# Patient Record
Sex: Female | Born: 1975
Health system: Southern US, Community
[De-identification: ages and names within clinical notes are randomized; demographics above are authoritative.]

## PROBLEM LIST (undated history)

## (undated) DIAGNOSIS — F419 Anxiety disorder, unspecified: Secondary | ICD-10-CM

## (undated) DIAGNOSIS — Z8619 Personal history of other infectious and parasitic diseases: Secondary | ICD-10-CM

## (undated) DIAGNOSIS — E282 Polycystic ovarian syndrome: Secondary | ICD-10-CM

## (undated) DIAGNOSIS — E1110 Type 2 diabetes mellitus with ketoacidosis without coma: Secondary | ICD-10-CM

## (undated) DIAGNOSIS — F32A Depression, unspecified: Secondary | ICD-10-CM

## (undated) DIAGNOSIS — G4730 Sleep apnea, unspecified: Secondary | ICD-10-CM

## (undated) DIAGNOSIS — Z87442 Personal history of urinary calculi: Secondary | ICD-10-CM

## (undated) DIAGNOSIS — I10 Essential (primary) hypertension: Secondary | ICD-10-CM

## (undated) DIAGNOSIS — R519 Headache, unspecified: Secondary | ICD-10-CM

## (undated) DIAGNOSIS — J45909 Unspecified asthma, uncomplicated: Secondary | ICD-10-CM

## (undated) DIAGNOSIS — K219 Gastro-esophageal reflux disease without esophagitis: Secondary | ICD-10-CM

## (undated) DIAGNOSIS — F329 Major depressive disorder, single episode, unspecified: Secondary | ICD-10-CM

## (undated) DIAGNOSIS — E559 Vitamin D deficiency, unspecified: Secondary | ICD-10-CM

## (undated) DIAGNOSIS — M1990 Unspecified osteoarthritis, unspecified site: Secondary | ICD-10-CM

## (undated) HISTORY — DX: Unspecified osteoarthritis, unspecified site: M19.90

## (undated) HISTORY — DX: Vitamin D deficiency, unspecified: E55.9

## (undated) HISTORY — PX: FRACTURE SURGERY: SHX138

## (undated) HISTORY — DX: Depression, unspecified: F32.A

## (undated) HISTORY — DX: Type 2 diabetes mellitus with ketoacidosis without coma: E11.10

## (undated) HISTORY — DX: Gastro-esophageal reflux disease without esophagitis: K21.9

## (undated) HISTORY — DX: Major depressive disorder, single episode, unspecified: F32.9

## (undated) HISTORY — DX: Polycystic ovarian syndrome: E28.2

## (undated) HISTORY — DX: Anxiety disorder, unspecified: F41.9

## (undated) HISTORY — DX: Personal history of other infectious and parasitic diseases: Z86.19

---
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
## 1998-10-11 DIAGNOSIS — E282 Polycystic ovarian syndrome: Secondary | ICD-10-CM | POA: Insufficient documentation

## 1998-10-11 HISTORY — DX: Polycystic ovarian syndrome: E28.2

## 2004-10-11 DIAGNOSIS — J45909 Unspecified asthma, uncomplicated: Secondary | ICD-10-CM

## 2004-10-11 HISTORY — DX: Unspecified asthma, uncomplicated: J45.909

## 2007-11-17 ENCOUNTER — Ambulatory Visit: Payer: Self-pay | Admitting: Family Medicine

## 2009-01-13 DIAGNOSIS — G4710 Hypersomnia, unspecified: Secondary | ICD-10-CM | POA: Insufficient documentation

## 2009-01-13 DIAGNOSIS — Z8632 Personal history of gestational diabetes: Secondary | ICD-10-CM

## 2009-01-13 HISTORY — DX: Personal history of gestational diabetes: Z86.32

## 2009-01-14 DIAGNOSIS — E559 Vitamin D deficiency, unspecified: Secondary | ICD-10-CM | POA: Insufficient documentation

## 2009-05-14 ENCOUNTER — Ambulatory Visit: Payer: Self-pay | Admitting: Gastroenterology

## 2009-06-26 ENCOUNTER — Ambulatory Visit: Payer: Self-pay

## 2009-08-20 DIAGNOSIS — K219 Gastro-esophageal reflux disease without esophagitis: Secondary | ICD-10-CM | POA: Insufficient documentation

## 2009-08-29 ENCOUNTER — Ambulatory Visit: Payer: Self-pay

## 2009-09-10 ENCOUNTER — Ambulatory Visit: Payer: Self-pay

## 2010-03-06 ENCOUNTER — Ambulatory Visit: Payer: Self-pay

## 2010-03-11 ENCOUNTER — Ambulatory Visit: Payer: Self-pay

## 2010-05-01 ENCOUNTER — Ambulatory Visit: Payer: Self-pay

## 2010-05-11 ENCOUNTER — Ambulatory Visit: Payer: Self-pay

## 2010-06-22 ENCOUNTER — Encounter: Payer: Self-pay | Admitting: Obstetrics and Gynecology

## 2011-03-31 ENCOUNTER — Emergency Department: Payer: Self-pay | Admitting: Unknown Physician Specialty

## 2013-09-27 ENCOUNTER — Ambulatory Visit: Payer: Self-pay

## 2014-11-13 ENCOUNTER — Ambulatory Visit: Payer: Self-pay | Admitting: Family Medicine

## 2014-11-13 LAB — BASIC METABOLIC PANEL
BUN: 11 mg/dL (ref 4–21)
Creatinine: 0.6 mg/dL (ref 0.5–1.1)
GLUCOSE: 126 mg/dL
Potassium: 4.5 mmol/L (ref 3.4–5.3)
Sodium: 136 mmol/L — AB (ref 137–147)

## 2014-11-13 LAB — LIPID PANEL
Cholesterol: 176 mg/dL (ref 0–200)
HDL: 34 mg/dL — AB (ref 35–70)
LDL Cholesterol: 85 mg/dL
Triglycerides: 285 mg/dL — AB (ref 40–160)

## 2014-11-13 LAB — TSH: TSH: 0.86 u[IU]/mL (ref 0.41–5.90)

## 2015-02-26 ENCOUNTER — Other Ambulatory Visit: Payer: Self-pay | Admitting: Family Medicine

## 2015-02-26 ENCOUNTER — Ambulatory Visit
Admission: RE | Admit: 2015-02-26 | Discharge: 2015-02-26 | Disposition: A | Discharge status: Home / Self Care | Payer: BLUE CROSS/BLUE SHIELD | Source: Ambulatory Visit | Attending: Family Medicine | Admitting: Family Medicine

## 2015-02-26 DIAGNOSIS — M545 Low back pain: Secondary | ICD-10-CM | POA: Diagnosis present

## 2015-02-26 DIAGNOSIS — M47816 Spondylosis without myelopathy or radiculopathy, lumbar region: Secondary | ICD-10-CM | POA: Diagnosis not present

## 2015-05-12 ENCOUNTER — Encounter: Payer: Self-pay | Admitting: *Deleted

## 2015-05-14 ENCOUNTER — Encounter: Payer: Self-pay | Admitting: Obstetrics and Gynecology

## 2015-05-30 ENCOUNTER — Encounter: Payer: Self-pay | Admitting: Obstetrics and Gynecology

## 2015-06-06 ENCOUNTER — Ambulatory Visit (INDEPENDENT_AMBULATORY_CARE_PROVIDER_SITE_OTHER): Payer: BLUE CROSS/BLUE SHIELD | Admitting: Obstetrics and Gynecology

## 2015-06-06 ENCOUNTER — Encounter: Payer: Self-pay | Admitting: Obstetrics and Gynecology

## 2015-06-06 ENCOUNTER — Other Ambulatory Visit: Payer: Self-pay | Admitting: Obstetrics and Gynecology

## 2015-06-06 VITALS — BP 140/82 | HR 72 | Ht 61.0 in | Wt 270.2 lb

## 2015-06-06 DIAGNOSIS — Z01419 Encounter for gynecological examination (general) (routine) without abnormal findings: Secondary | ICD-10-CM

## 2015-06-06 MED ORDER — ESCITALOPRAM OXALATE 20 MG PO TABS: 20.0000 mg | ORAL_TABLET | Freq: Every day | ORAL | Status: DC

## 2015-06-06 NOTE — Progress Notes (Signed)
  Subjective:     Julia Lynch is a 39 y.o. female and is here for a comprehensive physical exam. The patient reports feeling worse anxiety for the last few months, desires increasing Lexapro. Also has been working out with Systems analyst x 6 months and lost 16#.  Social History   Social History  . Marital Status: Married    Spouse Name: N/A  . Number of Children: N/A  . Years of Education: N/A   Occupational History  . Not on file.   Social History Main Topics  . Smoking status: Former Games developer  . Smokeless tobacco: Never Used  . Alcohol Use: Yes  . Drug Use: No  . Sexual Activity: Yes     Comment: husband-vasectomy   Other Topics Concern  . Not on file   Social History Narrative   Health Maintenance  Topic Date Due  . HIV Screening  08/26/1991  . TETANUS/TDAP  08/26/1995  . INFLUENZA VACCINE  05/12/2015    The following portions of the patient's history were reviewed and updated as appropriate: allergies, current medications, past family history, past medical history, past social history, past surgical history and problem list.  Review of Systems A comprehensive review of systems was negative except for: Behavioral/Psych: positive for anxiety   Objective:    General appearance: alert, cooperative, appears stated age and morbidly obese Neck: no adenopathy, no carotid bruit, no JVD, supple, symmetrical, trachea midline and thyroid not enlarged, symmetric, no tenderness/mass/nodules Lungs: clear to auscultation bilaterally Breasts: normal appearance, no masses or tenderness Heart: regular rate and rhythm, S1, S2 normal, no murmur, click, rub or gallop Abdomen: soft, non-tender; bowel sounds normal; no masses,  no organomegaly Pelvic: cervix normal in appearance, external genitalia normal, no adnexal masses or tenderness, no cervical motion tenderness, rectovaginal septum normal, uterus normal size, shape, and consistency and vagina normal without discharge     Assessment:    Healthy female exam. Morbid Obesity; Anxiety on SSRI      Plan:  Pap and routine screening labs obtained; Increased Lexapro to  daily- to let me know within 3 weeks if symptoms have not improved.  RTC 1 year or as needed   See After Visit Summary for Counseling Recommendations

## 2015-06-06 NOTE — Patient Instructions (Signed)
  Place annual gynecologic exam patient instructions here.  Thank you for enrolling in MyChart. Please follow the instructions below to securely access your online medical record. MyChart allows you to send messages to your doctor, view your test results, manage appointments, and more.   How Do I Sign Up? 1. In your Internet browser, go to [employer] and enter [email or web address]. 2. Click on the Sign Up Now link in the Sign In box. You will see the New Member Sign Up page. 3. Enter your MyChart Access Code exactly as it appears below. You will not need to use this code after you've completed the sign-up process. If you do not sign up before the expiration date, you must request a new code.  MyChart Access Code: [email or web address] Expires: 08/05/2015  4:02 PM  4. Enter your Social Security Number ([id number]) and Date of Birth (mm/dd/yyyy) as indicated and click Submit. You will be taken to the next sign-up page. 5. Create a MyChart ID. This will be your MyChart login ID and cannot be changed, so think of one that is secure and easy to remember. 6. Create a MyChart password. You can change your password at any time. 7. Enter your Password Reset Question and Answer. This can be used at a later time if you forget your password.  8. Enter your e-mail address. You will receive e-mail notification when new information is available in MyChart. 9. Click Sign Up. You can now view your medical record.   Additional Information Remember, MyChart is NOT to be used for urgent needs. For medical emergencies, dial 911.

## 2015-06-07 LAB — COMPREHENSIVE METABOLIC PANEL
A/G RATIO: 1.5 (ref 1.1–2.5)
ALK PHOS: 80 IU/L (ref 39–117)
ALT: 28 IU/L (ref 0–32)
AST: 23 IU/L (ref 0–40)
Albumin: 4.1 g/dL (ref 3.5–5.5)
BUN/Creatinine Ratio: 27 — ABNORMAL HIGH (ref 8–20)
BUN: 19 mg/dL (ref 6–20)
Bilirubin Total: 0.3 mg/dL (ref 0.0–1.2)
CHLORIDE: 98 mmol/L (ref 97–108)
CO2: 25 mmol/L (ref 18–29)
Calcium: 9.5 mg/dL (ref 8.7–10.2)
Creatinine, Ser: 0.71 mg/dL (ref 0.57–1.00)
GFR calc Af Amer: 125 mL/min/{1.73_m2} (ref >59)
GFR, EST NON AFRICAN AMERICAN: 108 mL/min/{1.73_m2} (ref >59)
GLOBULIN, TOTAL: 2.8 g/dL (ref 1.5–4.5)
Glucose: 83 mg/dL (ref 65–99)
POTASSIUM: 4.3 mmol/L (ref 3.5–5.2)
SODIUM: 139 mmol/L (ref 134–144)
Total Protein: 6.9 g/dL (ref 6.0–8.5)

## 2015-06-07 LAB — THYROID PANEL WITH TSH
FREE THYROXINE INDEX: 2 (ref 1.2–4.9)
T3 UPTAKE RATIO: 25 % (ref 24–39)
T4 TOTAL: 8.1 ug/dL (ref 4.5–12.0)
TSH: 0.648 u[IU]/mL (ref 0.450–4.500)

## 2015-06-07 LAB — HEMOGLOBIN A1C
Est. average glucose Bld gHb Est-mCnc: 123 mg/dL
Hgb A1c MFr Bld: 5.9 % — ABNORMAL HIGH (ref 4.8–5.6)

## 2015-06-07 LAB — LIPID PANEL
CHOL/HDL RATIO: 4.8 ratio — AB (ref 0.0–4.4)
Cholesterol, Total: 178 mg/dL (ref 100–199)
HDL: 37 mg/dL — AB (ref >39)
LDL Calculated: 81 mg/dL (ref 0–99)
TRIGLYCERIDES: 298 mg/dL — AB (ref 0–149)
VLDL Cholesterol Cal: 60 mg/dL — ABNORMAL HIGH (ref 5–40)

## 2015-06-10 ENCOUNTER — Other Ambulatory Visit: Payer: Self-pay | Admitting: Obstetrics and Gynecology

## 2015-06-10 ENCOUNTER — Telehealth: Payer: Self-pay | Admitting: *Deleted

## 2015-06-10 NOTE — Telephone Encounter (Signed)
-----   Message from Ulyses Amor, [place] sent at 06/10/2015  4:38 PM EDT ----- Please let her know her labs are better, HgA1C still elevated, but better, and total chol now in normal, needs to keep workingon wt loss and low carb diet, will recheck levels next year, all other labs normal

## 2015-06-10 NOTE — Telephone Encounter (Signed)
Notified pt of results she was very happy 

## 2015-06-11 LAB — CYTOLOGY - PAP

## 2015-06-12 ENCOUNTER — Encounter: Payer: Self-pay | Admitting: Obstetrics and Gynecology

## 2015-06-12 ENCOUNTER — Encounter: Payer: Self-pay | Admitting: *Deleted

## 2015-07-15 ENCOUNTER — Other Ambulatory Visit: Payer: Self-pay | Admitting: Family Medicine

## 2015-08-07 ENCOUNTER — Encounter: Payer: Self-pay | Admitting: Obstetrics and Gynecology

## 2015-08-07 ENCOUNTER — Ambulatory Visit (INDEPENDENT_AMBULATORY_CARE_PROVIDER_SITE_OTHER): Payer: BLUE CROSS/BLUE SHIELD | Admitting: Obstetrics and Gynecology

## 2015-08-07 VITALS — BP 128/86 | HR 88 | Ht 61.0 in | Wt 271.7 lb

## 2015-08-07 DIAGNOSIS — T192XXA Foreign body in vulva and vagina, initial encounter: Secondary | ICD-10-CM

## 2015-08-07 NOTE — Progress Notes (Signed)
Subjective:     Patient ID: Julia Lynch, female   DOB: [birth year], 39 y.o.   MRN: [id number]  HPI Unsure if she removed last tampon 3 days ago  Review of Systems Cramping and vulvar pains, urinary frequency    Objective:   Physical Exam A&O x4 Well groomed obese female UA negative Pelvic exam: normal external genitalia, vulva, vagina, cervix, uterus and adnexa, no tampon found.    Assessment:     Urinary frequency Tampon check- none found     Plan:     Reassured RTC as needed.  Deyon Chizek Ines BloomerBurr, CNM

## 2015-09-12 ENCOUNTER — Other Ambulatory Visit: Payer: Self-pay | Admitting: Surgery

## 2015-09-12 DIAGNOSIS — S43431A Superior glenoid labrum lesion of right shoulder, initial encounter: Secondary | ICD-10-CM

## 2015-09-26 ENCOUNTER — Encounter: Payer: Self-pay | Admitting: Family Medicine

## 2015-09-26 ENCOUNTER — Ambulatory Visit (INDEPENDENT_AMBULATORY_CARE_PROVIDER_SITE_OTHER): Payer: BLUE CROSS/BLUE SHIELD | Admitting: Family Medicine

## 2015-09-26 VITALS — BP 142/88 | HR 90 | Temp 97.8°F | Resp 16 | Ht 61.0 in | Wt 275.0 lb

## 2015-09-26 DIAGNOSIS — J45909 Unspecified asthma, uncomplicated: Secondary | ICD-10-CM | POA: Insufficient documentation

## 2015-09-26 DIAGNOSIS — R519 Headache, unspecified: Secondary | ICD-10-CM | POA: Insufficient documentation

## 2015-09-26 DIAGNOSIS — F419 Anxiety disorder, unspecified: Secondary | ICD-10-CM | POA: Insufficient documentation

## 2015-09-26 DIAGNOSIS — R12 Heartburn: Secondary | ICD-10-CM | POA: Insufficient documentation

## 2015-09-26 DIAGNOSIS — R413 Other amnesia: Secondary | ICD-10-CM | POA: Diagnosis not present

## 2015-09-26 DIAGNOSIS — R51 Headache: Secondary | ICD-10-CM | POA: Diagnosis not present

## 2015-09-26 DIAGNOSIS — Z23 Encounter for immunization: Secondary | ICD-10-CM | POA: Diagnosis not present

## 2015-09-26 DIAGNOSIS — F4321 Adjustment disorder with depressed mood: Secondary | ICD-10-CM | POA: Insufficient documentation

## 2015-09-26 DIAGNOSIS — M549 Dorsalgia, unspecified: Secondary | ICD-10-CM | POA: Insufficient documentation

## 2015-09-26 DIAGNOSIS — M1990 Unspecified osteoarthritis, unspecified site: Secondary | ICD-10-CM | POA: Insufficient documentation

## 2015-09-26 DIAGNOSIS — G43909 Migraine, unspecified, not intractable, without status migrainosus: Secondary | ICD-10-CM | POA: Insufficient documentation

## 2015-09-26 DIAGNOSIS — N943 Premenstrual tension syndrome: Secondary | ICD-10-CM | POA: Insufficient documentation

## 2015-09-26 MED ORDER — DIAZEPAM 5 MG PO TABS: ORAL_TABLET | ORAL | Status: DC

## 2015-09-26 MED ORDER — AMPHETAMINE-DEXTROAMPHET ER 10 MG PO CP24: 10.0000 mg | ORAL_CAPSULE | Freq: Every day | ORAL | Status: DC

## 2015-09-26 NOTE — Progress Notes (Signed)
Patient: Julia Lynch Female    DOB: 02-Jan-1976   39 y.o.   MRN: [id number] Visit Date: 09/26/2015  Today's Provider: Mila Merryonald Miabella Shannahan, MD   Chief Complaint  Patient presents with  . Headache    follow up   Subjective:    HPI   Follow up Headache Patient states 5 days ago she experienced a tension headache. Headache was relieved after taking Diazepam and OTC Tension headache medication. Patient was last last seen 11/13/2014 for Tension headache. Treatment at that visit includes prescribing Cyclobenzaprine. Patient states she does not like taking Cyclobenzaprine because it makes her feel groggy and drowsy the day after taking it. Patient states diazepam works well and requests refill.   Memory difficulty She also reports she has been having increasing difficulties with focus, attention and memory over the last year. She states she has always been forgetful, but it seems much worse since returning to school. She works as a Associate Professorpharmacy tech and states she often loses focus at work and having difficulty keeping up with tasks. She is having to be certified to continue working as pharm taking and has to study for test. She is having difficulty staying focused when studying and is interested in trying medication to help with focus and attention.   Follow up anxiety She continue on escitalopram which she is tolerating well. She has been a bit anxious having to study for certification for pharmacy tech. Otherwise is doing well. Denies feeling depressed. No difficulty with sleeping or appetite.   She did have some labs done with gyn in August.   Lab Results  Component Value Date   TSH 0.648 06/06/2015   CMP Latest Ref Rng 06/06/2015 11/13/2014  Glucose 65 - 99 mg/dL 83 -  BUN 6 - 20 mg/dL 19 11  Creatinine 2.580.57 - 1.00 mg/dL 5.270.71 0.6  Sodium 782134 - 144 mmol/L 139 136(A)  Potassium 3.5 - 5.2 mmol/L 4.3 4.5  Chloride 97 - 108 mmol/L 98 -  CO2 18 - 29 mmol/L 25 -  Calcium 8.7 - 10.2  mg/dL 9.5 -  Total Protein 6.0 - 8.5 g/dL 6.9 -  Total Bilirubin 0.0 - 1.2 mg/dL 0.3 -  Alkaline Phos 39 - 117 IU/L 80 -  AST 0 - 40 IU/L 23 -  ALT 0 - 32 IU/L 28 -   Lab Results  Component Value Date   HGBA1C 5.9* 06/06/2015       Allergies  Allergen Reactions  . Lodine [Etodolac]    Previous Medications   CYCLOBENZAPRINE (FLEXERIL) 5 MG TABLET    Take 1-2 tablets by mouth 2 (two) times daily.   ESCITALOPRAM (LEXAPRO) 20 MG TABLET    Take 1 tablet (20 mg total) by mouth daily.   NYSTATIN CREAM (MYCOSTATIN)    Apply 2-3 application topically daily. Apply to rash   OMEPRAZOLE (PRILOSEC) 20 MG CAPSULE    TAKE 1 CAPSULE BY MOUTH ONCE A DAY---NEED TO SCHEDULE OFFICE VISIT    Review of Systems  Constitutional: Negative for chills, appetite change and fatigue.  Respiratory: Negative for chest tightness and shortness of breath.   Cardiovascular: Negative for chest pain and palpitations.  Psychiatric/Behavioral: Positive for decreased concentration.    Social History  Substance Use Topics  . Smoking status: Former Smoker -- 10 years    Quit date: 10/11/2004  . Smokeless tobacco: Never Used  . Alcohol Use: No   Objective:   BP 142/88 mmHg  Pulse 90  Temp(Src) 97.8 F (36.6 C) (Oral)  Resp 16  Ht  (1.549 m)  Wt 275 lb (124.739 kg)  BMI 51.99 kg/m2  SpO2 97%  Physical Exam   General Appearance:    Alert, cooperative, no distress  Eyes:    PERRL, conjunctiva/corneas clear, EOM's intact       Lungs:     Clear to auscultation bilaterally, respirations unlabored  Heart:    Regular rate and rhythm  Neurologic:   Awake, alert, oriented x 3. No apparent focal neurological           defect.      ASRS=4     Assessment & Plan:     1. Memory deficit ASRS suggest ADD. She is interested in trying to medication due to difficulties performing tasks at work.  - amphetamine-dextroamphetamine (ADDERALL XR) 10 MG 24 hr capsule; Take 1 capsule (10 mg total) by mouth daily.   Dispense: 30 capsule; Refill: 0  2. Anxiety Stable on escitalopram.  Continue current medications.   3. Generalized headaches Does well with prn diazepam - diazepam (VALIUM) 5 MG tablet; As needed for headaches. No more than 3 in a day  Dispense: 30 tablet; Refill: 1  4. Need for influenza vaccination  - Flu Vaccine QUAD 36+ mos IM  Addressed extensive list of chronic and acute medical problems today requiring extensive time in counseling and coordination care.  Over half of this 45 minute visit were spent in counseling and coordinating care of multiple medical problems.       Mila Merry, MD  [facility]

## 2015-10-02 ENCOUNTER — Ambulatory Visit: Payer: BLUE CROSS/BLUE SHIELD

## 2015-10-17 ENCOUNTER — Encounter: Payer: Self-pay | Admitting: Family Medicine

## 2015-10-17 ENCOUNTER — Ambulatory Visit (INDEPENDENT_AMBULATORY_CARE_PROVIDER_SITE_OTHER): Payer: 59 | Admitting: Family Medicine

## 2015-10-17 VITALS — BP 142/84 | HR 86 | Temp 97.9°F | Resp 16 | Wt 277.0 lb

## 2015-10-17 DIAGNOSIS — I10 Essential (primary) hypertension: Secondary | ICD-10-CM

## 2015-10-17 DIAGNOSIS — R030 Elevated blood-pressure reading, without diagnosis of hypertension: Secondary | ICD-10-CM

## 2015-10-17 DIAGNOSIS — R413 Other amnesia: Secondary | ICD-10-CM

## 2015-10-17 DIAGNOSIS — IMO0001 Reserved for inherently not codable concepts without codable children: Secondary | ICD-10-CM

## 2015-10-17 HISTORY — DX: Essential (primary) hypertension: I10

## 2015-10-17 MED ORDER — METHYLPHENIDATE HCL ER (OSM) 18 MG PO TBCR: 18.0000 mg | EXTENDED_RELEASE_TABLET | Freq: Every day | ORAL | Status: DC

## 2015-10-17 NOTE — Patient Instructions (Signed)
Try to limit sodium to less than 2000mg  per day

## 2015-10-17 NOTE — Progress Notes (Signed)
Patient: Julia Lynch Female    DOB: [birth year]   40 y.o.   MRN: [id number] Visit Date: 10/17/2015  Today's Provider: Mila Merryonald Emrey Thornley, MD   Chief Complaint  Patient presents with  . Follow-up    Memory Deficit   Subjective:    HPI  Follow up Memory Deficit:  Last office visit was 09/26/2015. ASRS screening was done and suggestive of ADD. Patient was started on Adderall XR 10mg  due to difficulties performing tasks at work. Today patient comes in stating she stopped taking Adderall 2 -3 days ago due to it causing headaches. She states that she feels her blood pressure is up. Patient has checked her blood pressure at home with a wrist monitor and the systolic reading was 140-160. Patient is unsure of the Diastolic reading. Patient states when she was taking the Adderall she did see significant improvement with her energy level and focus.      Allergies  Allergen Reactions  . Lodine [Etodolac]    Previous Medications   AMPHETAMINE-DEXTROAMPHETAMINE (ADDERALL XR) 10 MG 24 HR CAPSULE    Take 1 capsule (10 mg total) by mouth daily.   CYCLOBENZAPRINE (FLEXERIL) 5 MG TABLET    Take 1-2 tablets by mouth 2 (two) times daily.   DIAZEPAM (VALIUM) 5 MG TABLET    As needed for headaches. No more than 3 in a day   ESCITALOPRAM (LEXAPRO) 20 MG TABLET    Take 1 tablet (20 mg total) by mouth daily.   NYSTATIN CREAM (MYCOSTATIN)    Apply 2-3 application topically daily. Apply to rash   OMEPRAZOLE (PRILOSEC) 20 MG CAPSULE    TAKE 1 CAPSULE BY MOUTH ONCE A DAY---NEED TO SCHEDULE OFFICE VISIT    Review of Systems  Constitutional: Positive for fatigue. Negative for fever, chills and appetite change.  Respiratory: Negative for chest tightness and shortness of breath.   Cardiovascular: Negative for chest pain and palpitations.  Gastrointestinal: Negative for nausea, vomiting and abdominal pain.  Neurological: Positive for numbness (tingling in hands and feet occasionall) and headaches.  Negative for dizziness and weakness.    Social History  Substance Use Topics  . Smoking status: Former Smoker -- 10 years    Quit date: 10/11/2004  . Smokeless tobacco: Never Used  . Alcohol Use: No   Objective:   Filed Vitals:   10/17/15 1630 10/17/15 1705  BP: 150/90 142/84  Pulse: 86   Temp: 97.9 F (36.6 C)   Resp: 16     Physical Exam  General Appearance:    Alert, cooperative, no distress, obese  Eyes:    PERRL, conjunctiva/corneas clear, EOM's intact       Lungs:     Clear to auscultation bilaterally, respirations unlabored  Heart:    Regular rate and rhythm  Neurologic:   Awake, alert, oriented x 3. No apparent focal neurological           defect.           Assessment & Plan:     1. Elevated blood pressure Patient Instructions  Try to limit sodium to less than 2000mg  per day  Counseled on dietary changes to improve BP. She is already exercising most days.   2. Memory deficit Had significant improvement with adderall, but did not tolerate. Try Concerta .  - methylphenidate (CONCERTA) 18 MG PO CR tablet; Take 1 tablet (18 mg total) by mouth daily.  Dispense: 30 tablet; Refill: 0  Lelon Huh, MD  [facility]

## 2015-10-23 ENCOUNTER — Ambulatory Visit: Payer: BLUE CROSS/BLUE SHIELD

## 2015-10-23 ENCOUNTER — Ambulatory Visit: Payer: 59

## 2015-10-27 ENCOUNTER — Ambulatory Visit: Payer: BLUE CROSS/BLUE SHIELD | Admitting: Family Medicine

## 2015-11-14 ENCOUNTER — Ambulatory Visit: Payer: 59 | Admitting: Family Medicine

## 2015-12-15 ENCOUNTER — Encounter: Payer: Self-pay | Admitting: Physician Assistant

## 2015-12-15 ENCOUNTER — Ambulatory Visit (INDEPENDENT_AMBULATORY_CARE_PROVIDER_SITE_OTHER): Payer: 59 | Admitting: Physician Assistant

## 2015-12-15 VITALS — BP 138/88 | HR 94 | Temp 98.6°F | Resp 16 | Wt 282.8 lb

## 2015-12-15 DIAGNOSIS — R059 Cough, unspecified: Secondary | ICD-10-CM

## 2015-12-15 DIAGNOSIS — R05 Cough: Secondary | ICD-10-CM | POA: Diagnosis not present

## 2015-12-15 DIAGNOSIS — R6889 Other general symptoms and signs: Secondary | ICD-10-CM | POA: Diagnosis not present

## 2015-12-15 DIAGNOSIS — J069 Acute upper respiratory infection, unspecified: Secondary | ICD-10-CM | POA: Diagnosis not present

## 2015-12-15 LAB — POCT INFLUENZA A/B
INFLUENZA A, POC: NEGATIVE
INFLUENZA B, POC: NEGATIVE

## 2015-12-15 MED ORDER — HYDROCODONE-HOMATROPINE 5-1.5 MG/5ML PO SYRP: 5.0000 mL | ORAL_SOLUTION | Freq: Three times a day (TID) | ORAL | Status: DC | PRN

## 2015-12-15 NOTE — Patient Instructions (Signed)
Upper Respiratory Infection, Adult Most upper respiratory infections (URIs) are a viral infection of the air passages leading to the lungs. A URI affects the nose, throat, and upper air passages. The most common type of URI is nasopharyngitis and is typically referred to as "the common cold." URIs run their course and usually go away on their own. Most of the time, a URI does not require medical attention, but sometimes a bacterial infection in the upper airways can follow a viral infection. This is called a secondary infection. Sinus and middle ear infections are common types of secondary upper respiratory infections. Bacterial pneumonia can also complicate a URI. A URI can worsen asthma and chronic obstructive pulmonary disease (COPD). Sometimes, these complications can require emergency medical care and may be life threatening.  CAUSES Almost all URIs are caused by viruses. A virus is a type of germ and can spread from one person to another.  RISKS FACTORS You may be at risk for a URI if:   You smoke.   You have chronic heart or lung disease.  You have a weakened defense (immune) system.   You are very young or very old.   You have nasal allergies or asthma.  You work in crowded or poorly ventilated areas.  You work in health care facilities or schools. SIGNS AND SYMPTOMS  Symptoms typically develop 2-3 days after you come in contact with a cold virus. Most viral URIs last 7-10 days. However, viral URIs from the influenza virus (flu virus) can last 14-18 days and are typically more severe. Symptoms may include:   Runny or stuffy (congested) nose.   Sneezing.   Cough.   Sore throat.   Headache.   Fatigue.   Fever.   Loss of appetite.   Pain in your forehead, behind your eyes, and over your cheekbones (sinus pain).  Muscle aches.  DIAGNOSIS  Your health care provider may diagnose a URI by:  Physical exam.  Tests to check that your symptoms are not due to  another condition such as:  Strep throat.  Sinusitis.  Pneumonia.  Asthma. TREATMENT  A URI goes away on its own with time. It cannot be cured with medicines, but medicines may be prescribed or recommended to relieve symptoms. Medicines may help:  Reduce your fever.  Reduce your cough.  Relieve nasal congestion. HOME CARE INSTRUCTIONS   Take medicines only as directed by your health care provider.   Gargle warm saltwater or take cough drops to comfort your throat as directed by your health care provider.  Use a warm mist humidifier or inhale steam from a shower to increase air moisture. This may make it easier to breathe.  Drink enough fluid to keep your urine clear or pale yellow.   Eat soups and other clear broths and maintain good nutrition.   Rest as needed.   Return to work when your temperature has returned to normal or as your health care provider advises. You may need to stay home longer to avoid infecting others. You can also use a face mask and careful hand washing to prevent spread of the virus.  Increase the usage of your inhaler if you have asthma.   Do not use any tobacco products, including cigarettes, chewing tobacco, or electronic cigarettes. If you need help quitting, ask your health care provider. PREVENTION  The best way to protect yourself from getting a cold is to practice good hygiene.   Avoid oral or hand contact with people with cold   symptoms.   Wash your hands often if contact occurs.  There is no clear evidence that vitamin C, vitamin E, echinacea, or exercise reduces the chance of developing a cold. However, it is always recommended to get plenty of rest, exercise, and practice good nutrition.  SEEK MEDICAL CARE IF:   You are getting worse rather than better.   Your symptoms are not controlled by medicine.   You have chills.  You have worsening shortness of breath.  You have brown or red mucus.  You have yellow or brown nasal  discharge.  You have pain in your face, especially when you bend forward.  You have a fever.  You have swollen neck glands.  You have pain while swallowing.  You have white areas in the back of your throat. SEEK IMMEDIATE MEDICAL CARE IF:   You have severe or persistent:  Headache.  Ear pain.  Sinus pain.  Chest pain.  You have chronic lung disease and any of the following:  Wheezing.  Prolonged cough.  Coughing up blood.  A change in your usual mucus.  You have a stiff neck.  You have changes in your:  Vision.  Hearing.  Thinking.  Mood. MAKE SURE YOU:   Understand these instructions.  Will watch your condition.  Will get help right away if you are not doing well or get worse.   This information is not intended to replace advice given to you by your health care provider. Make sure you discuss any questions you have with your health care provider.   Document Released: 03/23/2001 Document Revised: 02/11/2015 Document Reviewed: 01/02/2014 Elsevier Interactive Patient Education 2016 Elsevier Inc.  

## 2015-12-15 NOTE — Progress Notes (Signed)
Patient: Julia Lynch Female    DOB: 09/30/1976   40 y.o.   MRN: [id number] Visit Date: 12/15/2015  Today's Provider: Margaretann LovelessJennifer M Burnette, PA-C   Chief Complaint  Patient presents with  . Cough  . Sore Throat   Subjective:    Cough This is a new problem. The current episode started yesterday (Last night). The problem has been gradually worsening. The problem occurs constantly. The cough is non-productive. Associated symptoms include chills, ear congestion, ear pain, nasal congestion, postnasal drip, rhinorrhea, a sore throat and shortness of breath. Pertinent negatives include no fever, headaches or wheezing. The symptoms are aggravated by lying down. Treatments tried: Mucinex this morning. Patient's son was diagnosed with the Flu a few days ago. The treatment provided no relief.  Sore Throat  This is a new (Is mostly a "tickle" in her throat.) problem. The current episode started today. The problem has been unchanged. There has been no fever. Associated symptoms include congestion, coughing, ear pain and shortness of breath. Pertinent negatives include no abdominal pain, diarrhea, ear discharge, headaches, swollen glands, trouble swallowing or vomiting. Treatments tried: Mucinex. The treatment provided no relief.  She has a son that was flu positive last week. She is keeping a friend's child this weekend and wanted to make sure she also did not have the flu.     Allergies  Allergen Reactions  . Ibuprofen     Other reaction(s): Other (See Comments) GI Upset  . Lodine [Etodolac]    Previous Medications   CYCLOBENZAPRINE (FLEXERIL) 5 MG TABLET    Take 1-2 tablets by mouth 2 (two) times daily. Reported on 12/15/2015   DIAZEPAM (VALIUM) 5 MG TABLET    As needed for headaches. No more than 3 in a day   ESCITALOPRAM (LEXAPRO) 20 MG TABLET    Take 1 tablet (20 mg total) by mouth daily.   MELOXICAM (MOBIC) 7.5 MG TABLET    Take by mouth. Reported on 12/15/2015   METHYLPHENIDATE  (CONCERTA) 18 MG PO CR TABLET    Take 1 tablet (18 mg total) by mouth daily.   NAPROXEN SODIUM (ALEVE) 220 MG TABLET    Take by mouth. Reported on 12/15/2015   NYSTATIN CREAM (MYCOSTATIN)    Apply 2-3 application topically daily. Reported on 12/15/2015   OMEPRAZOLE (PRILOSEC) 20 MG CAPSULE    TAKE 1 CAPSULE BY MOUTH ONCE A DAY---NEED TO SCHEDULE OFFICE VISIT    Review of Systems  Constitutional: Positive for chills. Negative for fever.  HENT: Positive for congestion, ear pain, postnasal drip, rhinorrhea and sore throat. Negative for ear discharge, sinus pressure, sneezing and trouble swallowing.   Respiratory: Positive for cough, chest tightness and shortness of breath. Negative for wheezing.   Gastrointestinal: Negative for nausea, vomiting, abdominal pain and diarrhea.  Neurological: Negative for dizziness and headaches.    Social History  Substance Use Topics  . Smoking status: Former Smoker -- 10 years    Quit date: 10/11/2004  . Smokeless tobacco: Never Used  . Alcohol Use: No   Objective:   BP 138/88 mmHg  Pulse 94  Temp(Src) 98.6 F (37 C) (Oral)  Resp 16  Wt 282 lb 12.8 oz (128.277 kg)  SpO2 98%  LMP 12/03/2015  Physical Exam  Constitutional: She appears well-developed and well-nourished. No distress.  HENT:  Head: Normocephalic and atraumatic.  Right Ear: Hearing, tympanic membrane, external ear and ear canal normal.  Left Ear: Hearing, tympanic membrane, external ear and  ear canal normal.  Nose: Mucosal edema and rhinorrhea present. Right sinus exhibits no maxillary sinus tenderness and no frontal sinus tenderness. Left sinus exhibits no maxillary sinus tenderness and no frontal sinus tenderness.  Mouth/Throat: Uvula is midline, oropharynx is clear and moist and mucous membranes are normal. No oropharyngeal exudate, posterior oropharyngeal edema or posterior oropharyngeal erythema.  Eyes: Conjunctivae are normal. Pupils are equal, round, and reactive to light. Right eye  exhibits no discharge. Left eye exhibits no discharge. No scleral icterus.  Neck: Normal range of motion. Neck supple. No tracheal deviation present. No thyromegaly present.  Cardiovascular: Normal rate, regular rhythm and normal heart sounds.  Exam reveals no gallop and no friction rub.   No murmur heard. Pulmonary/Chest: Effort normal and breath sounds normal. No stridor. No respiratory distress. She has no wheezes. She has no rales.  Lymphadenopathy:    She has no cervical adenopathy.  Skin: Skin is warm and dry. She is not diaphoretic.  Vitals reviewed.       Assessment & Plan:     1. Upper respiratory infection Most likely viral.  Continue symptomatic treatment. Stay well hydrated and try to get plenty of rest. Call if symptoms fail to improve in 10 days.  2. Flu-like symptoms Flu test negative. - POCT Influenza A/B  3. Cough Worsening nighttime cough affecting sleep. Will give hycodan cough syrup as below. Advised of drowsiness precautions. Stay well hydrated and try to get plenty of rest.  Call if no improvement or symptoms worsen. - HYDROcodone-homatropine (HYCODAN) 5-1.5 MG/5ML syrup; Take 5 mLs by mouth every 8 (eight) hours as needed for cough.  Dispense: 180 mL; Refill: 0       Margaretann Loveless, PA-C  [facility]

## 2015-12-23 ENCOUNTER — Other Ambulatory Visit: Payer: Self-pay | Admitting: *Deleted

## 2015-12-23 MED ORDER — OMEPRAZOLE 20 MG PO CPDR: DELAYED_RELEASE_CAPSULE | ORAL | Status: DC

## 2015-12-23 NOTE — Telephone Encounter (Signed)
Requesting 90 day supply.

## 2015-12-29 ENCOUNTER — Telehealth: Payer: Self-pay | Admitting: Family Medicine

## 2015-12-29 DIAGNOSIS — J0140 Acute pansinusitis, unspecified: Secondary | ICD-10-CM

## 2015-12-29 NOTE — Telephone Encounter (Signed)
Pt states she was seen 2 weeks ago for cough and congestion.  Pt states she was feeling better for 4 to 5 days.  Pt states she starterd back over the weekend and has cough and congestion again that is worse than the first time.  Pt is requesting a Rx to help with this.  [facility] [address]  CB#[id number]

## 2015-12-29 NOTE — Telephone Encounter (Signed)
Tried calling patient for more info, but not able to leave VM. Will try again later.

## 2015-12-30 MED ORDER — AMOXICILLIN-POT CLAVULANATE 875-125 MG PO TABS: 1.0000 | ORAL_TABLET | Freq: Two times a day (BID) | ORAL | Status: DC

## 2015-12-30 NOTE — Telephone Encounter (Signed)
Augmentin sent to [facility] [address] If does not improve with this she will need OV for recheck. Thanks.

## 2015-12-30 NOTE — Telephone Encounter (Signed)
Patient reports that she has been sick for the last 3-4 days. She reports that she was seen 2 weeks ago for cough, and her symptoms did improve with taking the hycodan. Patient feels like she now has an infection. She has nasal congestion, cough, headache, and PND. Patient reports that she coughs up thick green mucus. Patient denies any fever or shortness of breath. Patient is requesting something to be called into the pharmacy. [facility] [address] Thanks!

## 2015-12-30 NOTE — Telephone Encounter (Signed)
Advised patient as below.  

## 2016-07-28 ENCOUNTER — Encounter: Payer: 59 | Admitting: Obstetrics and Gynecology

## 2016-08-20 ENCOUNTER — Encounter: Payer: Self-pay | Admitting: Physician Assistant

## 2016-08-20 ENCOUNTER — Ambulatory Visit (INDEPENDENT_AMBULATORY_CARE_PROVIDER_SITE_OTHER): Payer: BLUE CROSS/BLUE SHIELD | Admitting: Physician Assistant

## 2016-08-20 DIAGNOSIS — R51 Headache: Secondary | ICD-10-CM

## 2016-08-20 DIAGNOSIS — Z6841 Body Mass Index (BMI) 40.0 and over, adult: Secondary | ICD-10-CM | POA: Diagnosis not present

## 2016-08-20 DIAGNOSIS — Z23 Encounter for immunization: Secondary | ICD-10-CM | POA: Diagnosis not present

## 2016-08-20 DIAGNOSIS — R519 Headache, unspecified: Secondary | ICD-10-CM

## 2016-08-20 MED ORDER — DIAZEPAM 5 MG PO TABS: ORAL_TABLET | ORAL | 5 refills | Status: DC

## 2016-08-20 NOTE — Progress Notes (Signed)
Patient: Julia Lynch Female    DOB: 1976/04/12   40 y.o.   MRN: [id number] Visit Date: 08/20/2016  Today's Provider: Margaretann LovelessJennifer M Danylle Ouk, PA-C   Chief Complaint  Patient presents with  . Obesity   Subjective:    HPI Patient is here today with c/o Obesity. She reports that last year she had a Systems analystpersonal trainer and she dropped 4 dress sizes. She was walking but now her right knee hurts so she can't walk anymore. She reports she eats 3 meals (large) a day with one snack. She eats until she is full. She drinks mostly water water. She reports that in the past 11 months she has gained 40 lbs.  282 in March 2017 to 303 now. She does also have PCOS and is not currently on any treatments for it. She does have hirsutism, acne and the weight gain which are most likely associated with the polycystic ovarian syndrome.     Allergies  Allergen Reactions  . Ibuprofen     Other reaction(s): Other (See Comments) GI Upset  . Lodine [Etodolac]      Current Outpatient Prescriptions:  .  cyclobenzaprine (FLEXERIL) 5 MG tablet, Take 1-2 tablets by mouth 2 (two) times daily. Reported on 12/15/2015, Disp: , Rfl:  .  diazepam (VALIUM) 5 MG tablet, As needed for headaches. No more than 3 in a day, Disp: 30 tablet, Rfl: 1 .  escitalopram (LEXAPRO) 20 MG tablet, Take 1 tablet (20 mg total) by mouth daily., Disp: 30 tablet, Rfl: 6 .  naproxen sodium (ALEVE) 220 MG tablet, Take by mouth. Reported on 12/15/2015, Disp: , Rfl:  .  omeprazole (PRILOSEC) 20 MG capsule, TAKE 1 CAPSULE BY MOUTH ONCE A DAY, Disp: 90 capsule, Rfl: 2 .  amoxicillin-clavulanate (AUGMENTIN) 875-125 MG tablet, Take 1 tablet by mouth 2 (two) times daily. (Patient not taking: Reported on 08/20/2016), Disp: 20 tablet, Rfl: 0 .  HYDROcodone-homatropine (HYCODAN) 5-1.5 MG/5ML syrup, Take 5 mLs by mouth every 8 (eight) hours as needed for cough. (Patient not taking: Reported on 08/20/2016), Disp: 180 mL, Rfl: 0 .  meloxicam (MOBIC) 7.5  MG tablet, Take by mouth. Reported on 12/15/2015, Disp: , Rfl:  .  methylphenidate (CONCERTA) 18 MG PO CR tablet, Take 1 tablet (18 mg total) by mouth daily. (Patient not taking: Reported on 08/20/2016), Disp: 30 tablet, Rfl: 0 .  nystatin cream (MYCOSTATIN), Apply 2-3 application topically daily. Reported on 12/15/2015, Disp: , Rfl:   Review of Systems  Constitutional: Positive for activity change and appetite change.  Respiratory: Negative.   Cardiovascular: Negative for chest pain, palpitations and leg swelling.  Gastrointestinal: Negative.   Genitourinary: Negative.   Musculoskeletal: Positive for arthralgias (right knee hurts).  Skin:       Lumps on left side of breast and neck. Patient does have known lipomas  Neurological: Negative.   Psychiatric/Behavioral: Negative.     Social History  Substance Use Topics  . Smoking status: Former Smoker    Years: 10.00    Quit date: 10/11/2004  . Smokeless tobacco: Never Used  . Alcohol use No   Objective:   BP 140/90 (BP Location: Left Arm, Patient Position: Sitting, Cuff Size: Large)   Pulse 79   Resp 16   Wt (!) 303 lb (137.4 kg)   BMI 57.25 kg/m   Physical Exam  Constitutional: She appears well-developed and well-nourished. No distress.  Neck: Normal range of motion. Neck supple. No tracheal deviation present. No  thyromegaly present.  Cardiovascular: Normal rate, regular rhythm and normal heart sounds.  Exam reveals no gallop and no friction rub.   No murmur heard. Pulmonary/Chest: Effort normal and breath sounds normal. No respiratory distress. She has no wheezes. She has no rales.  Lymphadenopathy:    She has no cervical adenopathy.  Skin: She is not diaphoretic.  Lipomas noted on the neck and left breast  Vitals reviewed.       Assessment & Plan:     1. Morbidly obese (HCC) I will check her labs as below to make sure that there is no other cause for her possible abnormal weight gain. I do feel her weight gain is most  likely secondary to her PCOS and unhealthy eating habits. I have advised her that she should start trying to eat healthier, decrease portion size and try a a food diary. If she is able to do a food diary are recommended her to stay between 1200-1400 cal per day. I will see her back in 4 weeks to see how she is doing on her own. If she is doing well on her own and starting to lose weight we may consider adding an appetite suppressant and metformin. - CBC w/Diff/Platelet - Comprehensive Metabolic Panel (CMET) - TSH - Lipid Profile  2. BMI 50.0-59.9, adult [facility](HCC) See above medical treatment plan. - CBC w/Diff/Platelet - Comprehensive Metabolic Panel (CMET) - TSH - Lipid Profile  3. Generalized headaches Stable. Diagnosis pulled for medication refill. Continue current medical treatment plan. - diazepam (VALIUM) 5 MG tablet; As needed for headaches. No more than 3 in a day  Dispense: 30 tablet; Refill: 5 - CBC w/Diff/Platelet - Comprehensive Metabolic Panel (CMET) - TSH - Lipid Profile  4. Need for influenza vaccination Flu vaccine given today without complication. Patient sat upright for 15 minutes to check for adverse reaction before being released. - Flu Vaccine QUAD 36+ mos IM       Margaretann LovelessJennifer M Kaelan Emami, PA-C  [facility]

## 2016-08-20 NOTE — Patient Instructions (Signed)

## 2016-08-21 LAB — COMPREHENSIVE METABOLIC PANEL
ALK PHOS: 88 IU/L (ref 39–117)
ALT: 26 IU/L (ref 0–32)
AST: 23 IU/L (ref 0–40)
Albumin/Globulin Ratio: 1.2 (ref 1.2–2.2)
Albumin: 3.9 g/dL (ref 3.5–5.5)
BUN/Creatinine Ratio: 15 (ref 9–23)
BUN: 10 mg/dL (ref 6–20)
Bilirubin Total: 0.3 mg/dL (ref 0.0–1.2)
CALCIUM: 9.4 mg/dL (ref 8.7–10.2)
CO2: 23 mmol/L (ref 18–29)
CREATININE: 0.65 mg/dL (ref 0.57–1.00)
Chloride: 98 mmol/L (ref 96–106)
GFR calc Af Amer: 129 mL/min/{1.73_m2} (ref >59)
GFR, EST NON AFRICAN AMERICAN: 112 mL/min/{1.73_m2} (ref >59)
GLOBULIN, TOTAL: 3.2 g/dL (ref 1.5–4.5)
GLUCOSE: 115 mg/dL — AB (ref 65–99)
Potassium: 4.4 mmol/L (ref 3.5–5.2)
SODIUM: 141 mmol/L (ref 134–144)
Total Protein: 7.1 g/dL (ref 6.0–8.5)

## 2016-08-21 LAB — CBC WITH DIFFERENTIAL/PLATELET
BASOS ABS: 0 10*3/uL (ref 0.0–0.2)
Basos: 0 %
EOS (ABSOLUTE): 0.1 10*3/uL (ref 0.0–0.4)
EOS: 1 %
HEMATOCRIT: 42.3 % (ref 34.0–46.6)
Hemoglobin: 14.5 g/dL (ref 11.1–15.9)
IMMATURE GRANULOCYTES: 0 %
Immature Grans (Abs): 0 10*3/uL (ref 0.0–0.1)
Lymphocytes Absolute: 2.9 10*3/uL (ref 0.7–3.1)
Lymphs: 39 %
MCH: 28.2 pg (ref 26.6–33.0)
MCHC: 34.3 g/dL (ref 31.5–35.7)
MCV: 82 fL (ref 79–97)
MONOCYTES: 6 %
MONOS ABS: 0.5 10*3/uL (ref 0.1–0.9)
NEUTROS PCT: 54 %
Neutrophils Absolute: 4 10*3/uL (ref 1.4–7.0)
Platelets: 263 10*3/uL (ref 150–379)
RBC: 5.14 x10E6/uL (ref 3.77–5.28)
RDW: 13.4 % (ref 12.3–15.4)
WBC: 7.5 10*3/uL (ref 3.4–10.8)

## 2016-08-21 LAB — LIPID PANEL
CHOLESTEROL TOTAL: 195 mg/dL (ref 100–199)
Chol/HDL Ratio: 4.3 ratio units (ref 0.0–4.4)
HDL: 45 mg/dL (ref >39)
LDL CALC: 88 mg/dL (ref 0–99)
Triglycerides: 310 mg/dL — ABNORMAL HIGH (ref 0–149)
VLDL CHOLESTEROL CAL: 62 mg/dL — AB (ref 5–40)

## 2016-08-21 LAB — TSH: TSH: 1.1 u[IU]/mL (ref 0.450–4.500)

## 2016-08-24 ENCOUNTER — Telehealth: Payer: Self-pay

## 2016-08-24 NOTE — Telephone Encounter (Signed)
Patient advised as directed below.  Thanks,  -Uriel Horkey 

## 2016-08-24 NOTE — Telephone Encounter (Signed)
-----   Message from Margaretann LovelessJennifer M Burnette, [place]-C sent at 08/24/2016 10:44 AM EST ----- All labs are within normal limits and stable with exception of triglycerides which are fairly stable from last year. Other cholesterol markers are good. Triglycerides are most closely related to diet. Try to focus on eating healthier options and limiting fatty foods. Thanks! -JB

## 2016-09-17 ENCOUNTER — Ambulatory Visit: Payer: BLUE CROSS/BLUE SHIELD | Admitting: Physician Assistant

## 2016-09-22 ENCOUNTER — Other Ambulatory Visit: Payer: Self-pay | Admitting: Family Medicine

## 2016-10-01 ENCOUNTER — Ambulatory Visit: Payer: BLUE CROSS/BLUE SHIELD | Admitting: Physician Assistant

## 2016-10-08 ENCOUNTER — Ambulatory Visit: Payer: BLUE CROSS/BLUE SHIELD | Admitting: Physician Assistant

## 2016-10-20 ENCOUNTER — Encounter: Payer: 59 | Admitting: Obstetrics and Gynecology

## 2016-12-23 ENCOUNTER — Encounter: Payer: BLUE CROSS/BLUE SHIELD | Admitting: Obstetrics and Gynecology

## 2017-01-31 ENCOUNTER — Other Ambulatory Visit: Payer: Self-pay | Admitting: *Deleted

## 2017-01-31 MED ORDER — ESCITALOPRAM OXALATE 20 MG PO TABS: 20.0000 mg | ORAL_TABLET | Freq: Every day | ORAL | 2 refills | Status: DC

## 2017-02-04 ENCOUNTER — Ambulatory Visit: Payer: Self-pay | Admitting: Family Medicine

## 2017-03-30 ENCOUNTER — Ambulatory Visit (INDEPENDENT_AMBULATORY_CARE_PROVIDER_SITE_OTHER): Payer: BLUE CROSS/BLUE SHIELD | Admitting: Family Medicine

## 2017-03-30 ENCOUNTER — Encounter: Payer: Self-pay | Admitting: Family Medicine

## 2017-03-30 VITALS — BP 136/96 | HR 78 | Temp 98.7°F | Resp 16 | Wt 306.0 lb

## 2017-03-30 DIAGNOSIS — N3090 Cystitis, unspecified without hematuria: Secondary | ICD-10-CM | POA: Diagnosis not present

## 2017-03-30 LAB — POCT URINALYSIS DIPSTICK
GLUCOSE UA: NEGATIVE
KETONES UA: NEGATIVE
Nitrite, UA: NEGATIVE
Spec Grav, UA: 1.025 (ref 1.010–1.025)
Urobilinogen, UA: 0.2 E.U./dL
pH, UA: 6 (ref 5.0–8.0)

## 2017-03-30 MED ORDER — CEPHALEXIN 500 MG PO CAPS: 500.0000 mg | ORAL_CAPSULE | Freq: Two times a day (BID) | ORAL | 0 refills | Status: DC

## 2017-03-30 NOTE — Progress Notes (Signed)
Subjective:     Patient ID: Julia Lynch, female   DOB: September 11, 1976, 41 y.o.   MRN: [id number]  HPI  Chief Complaint  Patient presents with  . Dysuria    Patient comes in office this morning with complaints of burning with urination and frequency that started upon awakening today.   States it has been 6 years since she has had a UTI.   Review of Systems     Objective:   Physical Exam  Constitutional: She appears well-developed and well-nourished. No distress.  Genitourinary:  Genitourinary Comments: No cva tenderness       Assessment:    1. Cystitis - Urine Culture - POCT urinalysis dipstick - cephALEXin (KEFLEX) 500 MG capsule; Take 1 capsule (500 mg total) by mouth 2 (two) times daily.  Dispense: 14 capsule; Refill: 0    Plan:    Further f/u pending culture results.

## 2017-03-30 NOTE — Patient Instructions (Signed)
We will call you with the culture results 

## 2017-04-02 ENCOUNTER — Telehealth: Payer: Self-pay

## 2017-04-02 ENCOUNTER — Other Ambulatory Visit: Payer: Self-pay | Admitting: Family Medicine

## 2017-04-02 DIAGNOSIS — N760 Acute vaginitis: Secondary | ICD-10-CM

## 2017-04-02 LAB — URINE CULTURE

## 2017-04-02 MED ORDER — FLUCONAZOLE 150 MG PO TABS: 150.0000 mg | ORAL_TABLET | Freq: Once | ORAL | 0 refills | Status: AC

## 2017-04-02 NOTE — Telephone Encounter (Signed)
Diflucan sent in

## 2017-04-02 NOTE — Telephone Encounter (Signed)
-----   Message from Anola Gurneyobert Chauvin, [place] sent at 04/02/2017  9:02 AM EDT ----- Continue current antibiotic for an E.Coli infection.

## 2017-04-02 NOTE — Telephone Encounter (Signed)
Patient advised. She states she now has a yeast infection she has been using Monistat for 2 days. She is requesting a RX for [facility] [address]

## 2017-04-21 ENCOUNTER — Encounter: Payer: Self-pay | Admitting: Physician Assistant

## 2017-04-21 ENCOUNTER — Ambulatory Visit (INDEPENDENT_AMBULATORY_CARE_PROVIDER_SITE_OTHER): Payer: BLUE CROSS/BLUE SHIELD | Admitting: Physician Assistant

## 2017-04-21 VITALS — BP 132/88 | HR 82 | Temp 98.5°F | Resp 16 | Wt 301.8 lb

## 2017-04-21 DIAGNOSIS — I10 Essential (primary) hypertension: Secondary | ICD-10-CM

## 2017-04-21 MED ORDER — HYDROCHLOROTHIAZIDE 12.5 MG PO CAPS: 12.5000 mg | ORAL_CAPSULE | Freq: Every day | ORAL | 1 refills | Status: DC

## 2017-04-21 NOTE — Progress Notes (Signed)
Patient: Julia Lynch Female    DOB: 04-Sep-1976   41 y.o.   MRN: [id number] Visit Date: 04/21/2017  Today's Provider: Margaretann Loveless, PA-C   Chief Complaint  Patient presents with  . Hypertension   Subjective:    HPI  Elevated Blood Pressure:  Patient comes in today with c/o elevated blood pressure with headache.  BP Readings from Last 3 Encounters:  04/21/17 132/88  03/30/17 (!) 136/96  08/20/16 140/90   She is exercising. Bootcamp 3 days a week and walking. She denies any chest pain, SOB, DOE or extremity edema during exercise. She is not adherent to low salt diet.   Outside blood pressures are 170's-150's/80's/90's.Today her blood pressure was 135/94 this morning. She is experiencing none. She is having tension headache in the back of her head. Patient denies chest pain, chest pressure/discomfort, exertional chest pressure/discomfort, fatigue, irregular heart beat, lower extremity edema, near-syncope and palpitations.     Weight trend: stable Wt Readings from Last 3 Encounters:  04/21/17 (!) 301 lb 12.8 oz (136.9 kg)  03/30/17 (!) 306 lb (138.8 kg)  08/20/16 (!) 303 lb (137.4 kg)    Current diet: in general, a "healthy" diet    ------------------------------------------------------------------------     Allergies  Allergen Reactions  . Ibuprofen     Other reaction(s): Other (See Comments) GI Upset  . Lodine [Etodolac]      Current Outpatient Prescriptions:  .  diazepam (VALIUM) 5 MG tablet, As needed for headaches. No more than 3 in a day, Disp: 30 tablet, Rfl: 5 .  escitalopram (LEXAPRO) 20 MG tablet, Take 1 tablet (20 mg total) by mouth daily., Disp: 90 tablet, Rfl: 2 .  naproxen sodium (ALEVE) 220 MG tablet, Take by mouth. Reported on 12/15/2015, Disp: , Rfl:  .  omeprazole (PRILOSEC) 20 MG capsule, Take 1 capsule (20 mg total) by mouth daily., Disp: 90 capsule, Rfl: 2 .  cephALEXin (KEFLEX) 500 MG capsule, Take 1 capsule (500 mg total)  by mouth 2 (two) times daily. (Patient not taking: Reported on 04/21/2017), Disp: 14 capsule, Rfl: 0 .  cyclobenzaprine (FLEXERIL) 5 MG tablet, Take 1-2 tablets by mouth 2 (two) times daily. Reported on 12/15/2015, Disp: , Rfl:   Review of Systems  Constitutional: Negative for fatigue.  Eyes: Positive for visual disturbance.  Respiratory: Negative.   Cardiovascular: Negative for chest pain, palpitations and leg swelling.  Neurological: Positive for headaches. Negative for dizziness and light-headedness.    Social History  Substance Use Topics  . Smoking status: Former Smoker    Years: 10.00    Quit date: 10/11/2004  . Smokeless tobacco: Never Used  . Alcohol use No   Objective:   BP 132/88 (BP Location: Left Arm, Patient Position: Sitting, Cuff Size: Normal)   Pulse 82   Temp 98.5 F (36.9 C) (Oral)   Resp 16   Wt (!) 301 lb 12.8 oz (136.9 kg)   LMP 04/14/2017   BMI 57.02 kg/m    Physical Exam  Constitutional: She appears well-developed and well-nourished. No distress.  Neck: Normal range of motion. Neck supple. No JVD present. No tracheal deviation present. No thyromegaly present.  Cardiovascular: Normal rate, regular rhythm and normal heart sounds.  Exam reveals no gallop and no friction rub.   No murmur heard. Pulmonary/Chest: Effort normal and breath sounds normal. No respiratory distress. She has no wheezes. She has no rales.  Musculoskeletal: She exhibits no edema.  Lymphadenopathy:    She  has no cervical adenopathy.  Skin: She is not diaphoretic.  Vitals reviewed.       Assessment & Plan:     1. Essential hypertension Will start HCTZ as below. I will see her back in 4 weeks to recheck BP. At that time she is to bring her own home cuff for us to compare readings. She is to call if she does not tolerate the medications.  - hydrochlorothiazide (MICROZIDE) 12.5 MG capsule; Take 1 capsule (12.5 mg total) by mouth daily.  Dispense: 30 capsule; Refill: 1        Margaretann LovelessJennifer M Burnette, PA-C  [facility]

## 2017-04-21 NOTE — Patient Instructions (Signed)
Hydrochlorothiazide, HCTZ capsules or tablets What is this medicine? HYDROCHLOROTHIAZIDE (hye droe klor oh THYE a zide) is a diuretic. It increases the amount of urine passed, which causes the body to lose salt and water. This medicine is used to treat high blood pressure. It is also reduces the swelling and water retention caused by various medical conditions, such as heart, liver, or kidney disease. This medicine may be used for other purposes; ask your health care provider or pharmacist if you have questions. COMMON BRAND NAME(S): Esidrix, Ezide, HydroDIURIL, Microzide, Oretic, Zide What should I tell my health care provider before I take this medicine? They need to know if you have any of these conditions: -diabetes -gout -immune system problems, like lupus -kidney disease or kidney stones -liver disease -pancreatitis -small amount of urine or difficulty passing urine -an unusual or allergic reaction to hydrochlorothiazide, sulfa drugs, other medicines, foods, dyes, or preservatives -pregnant or trying to get pregnant -breast-feeding How should I use this medicine? Take this medicine by mouth with a glass of water. Follow the directions on the prescription label. Take your medicine at regular intervals. Remember that you will need to pass urine frequently after taking this medicine. Do not take your doses at a time of day that will cause you problems. Do not stop taking your medicine unless your doctor tells you to. Talk to your pediatrician regarding the use of this medicine in children. Special care may be needed. Overdosage: If you think you have taken too much of this medicine contact a poison control center or emergency room at once. NOTE: This medicine is only for you. Do not share this medicine with others. What if I miss a dose? If you miss a dose, take it as soon as you can. If it is almost time for your next dose, take only that dose. Do not take double or extra doses. What may  interact with this medicine? -cholestyramine -colestipol -digoxin -dofetilide -lithium -medicines for blood pressure -medicines for diabetes -medicines that relax muscles for surgery -other diuretics -steroid medicines like prednisone or cortisone This list may not describe all possible interactions. Give your health care provider a list of all the medicines, herbs, non-prescription drugs, or dietary supplements you use. Also tell them if you smoke, drink alcohol, or use illegal drugs. Some items may interact with your medicine. What should I watch for while using this medicine? Visit your doctor or health care professional for regular checks on your progress. Check your blood pressure as directed. Ask your doctor or health care professional what your blood pressure should be and when you should contact him or her. You may need to be on a special diet while taking this medicine. Ask your doctor. Check with your doctor or health care professional if you get an attack of severe diarrhea, nausea and vomiting, or if you sweat a lot. The loss of too much body fluid can make it dangerous for you to take this medicine. You may get drowsy or dizzy. Do not drive, use machinery, or do anything that needs mental alertness until you know how this medicine affects you. Do not stand or sit up quickly, especially if you are an older patient. This reduces the risk of dizzy or fainting spells. Alcohol may interfere with the effect of this medicine. Avoid alcoholic drinks. This medicine may affect your blood sugar level. If you have diabetes, check with your doctor or health care professional before changing the dose of your diabetic medicine. This medicine   can make you more sensitive to the sun. Keep out of the sun. If you cannot avoid being in the sun, wear protective clothing and use sunscreen. Do not use sun lamps or tanning beds/booths. What side effects may I notice from receiving this medicine? Side effects  that you should report to your doctor or health care professional as soon as possible: -allergic reactions such as skin rash or itching, hives, swelling of the lips, mouth, tongue, or throat -changes in vision -chest pain -eye pain -fast or irregular heartbeat -feeling faint or lightheaded, falls -gout attack -muscle pain or cramps -pain or difficulty when passing urine -pain, tingling, numbness in the hands or feet -redness, blistering, peeling or loosening of the skin, including inside the mouth -unusually weak or tired Side effects that usually do not require medical attention (report to your doctor or health care professional if they continue or are bothersome): -change in sex drive or performance -dry mouth -headache -stomach upset This list may not describe all possible side effects. Call your doctor for medical advice about side effects. You may report side effects to FDA at 1-800-FDA-1088. Where should I keep my medicine? Keep out of the reach of children. Store at room temperature between 15 and 30 degrees C (59 and 86 degrees F). Do not freeze. Protect from light and moisture. Keep container closed tightly. Throw away any unused medicine after the expiration date. NOTE: This sheet is a summary. It may not cover all possible information. If you have questions about this medicine, talk to your doctor, pharmacist, or health care provider.  2018 Elsevier/Gold Standard (2010-05-22 12:57:37)  

## 2017-04-22 ENCOUNTER — Ambulatory Visit: Payer: BLUE CROSS/BLUE SHIELD | Admitting: Physician Assistant

## 2017-05-18 ENCOUNTER — Encounter: Payer: BLUE CROSS/BLUE SHIELD | Admitting: Obstetrics and Gynecology

## 2017-05-20 ENCOUNTER — Encounter: Payer: Self-pay | Admitting: Physician Assistant

## 2017-05-20 ENCOUNTER — Ambulatory Visit (INDEPENDENT_AMBULATORY_CARE_PROVIDER_SITE_OTHER): Payer: BLUE CROSS/BLUE SHIELD | Admitting: Physician Assistant

## 2017-05-20 VITALS — BP 148/100 | HR 86 | Temp 98.2°F | Resp 16 | Wt 292.0 lb

## 2017-05-20 DIAGNOSIS — F419 Anxiety disorder, unspecified: Secondary | ICD-10-CM | POA: Diagnosis not present

## 2017-05-20 DIAGNOSIS — R519 Headache, unspecified: Secondary | ICD-10-CM

## 2017-05-20 DIAGNOSIS — Z8759 Personal history of other complications of pregnancy, childbirth and the puerperium: Secondary | ICD-10-CM

## 2017-05-20 DIAGNOSIS — F4321 Adjustment disorder with depressed mood: Secondary | ICD-10-CM | POA: Diagnosis not present

## 2017-05-20 DIAGNOSIS — I10 Essential (primary) hypertension: Secondary | ICD-10-CM

## 2017-05-20 DIAGNOSIS — Z8659 Personal history of other mental and behavioral disorders: Secondary | ICD-10-CM | POA: Insufficient documentation

## 2017-05-20 DIAGNOSIS — R51 Headache: Secondary | ICD-10-CM | POA: Diagnosis not present

## 2017-05-20 HISTORY — DX: Personal history of other mental and behavioral disorders: Z86.59

## 2017-05-20 HISTORY — DX: Personal history of other complications of pregnancy, childbirth and the puerperium: Z87.59

## 2017-05-20 MED ORDER — DIAZEPAM 5 MG PO TABS: ORAL_TABLET | ORAL | 5 refills | Status: DC

## 2017-05-20 NOTE — Patient Instructions (Signed)
10 Relaxation Techniques That Zap Stress Fast By Jeannette Moninger   Listen  Relax. You deserve it, it's good for you, and it takes less time than you think. You don't need a spa weekend or a retreat. Each of these stress-relieving tips can get you from OMG to om in less than 15 minutes. 1. Meditate  A few minutes of practice per day can help ease anxiety. "Research suggests that daily meditation may alter the brain's neural pathways, making you more resilient to stress," says psychologist Robbie Maller Hartman, PhD, a [place] health and wellness coach. It's simple. Sit up straight with both feet on the floor. Close your eyes. Focus your attention on reciting -- out loud or silently -- a positive mantra such as "I feel at peace" or "I love myself." Place one hand on your belly to sync the mantra with your breaths. Let any distracting thoughts float by like clouds. 2. Breathe Deeply  Take a 5-minute break and focus on your breathing. Sit up straight, eyes closed, with a hand on your belly. Slowly inhale through your nose, feeling the breath start in your abdomen and work its way to the top of your head. Reverse the process as you exhale through your mouth.  "Deep breathing counters the effects of stress by slowing the heart rate and lowering blood pressure," psychologist Judith Tutin, PhD, says. She's a certified life coach in [place], [place] 3. Be Present  Slow down.  "Take 5 minutes and focus on only one behavior with awareness," Tutin says. Notice how the air feels on your face when you're walking and how your feet feel hitting the ground. Enjoy the texture and taste of each bite of food. When you spend time in the moment and focus on your senses, you should feel less tense. 4. Reach Out  Your social network is one of your best tools for handling stress. Talk to others -- preferably face to face, or at least on the phone. Share what's going on. You can get a fresh perspective while keeping your  connection strong. 5. Tune In to Your Body  Mentally scan your body to get a sense of how stress affects it each day. Lie on your back, or sit with your feet on the floor. Start at your toes and work your way up to your scalp, noticing how your body feels.  10 Relaxation Techniques That Zap Stress Fast By Jeannette Moninger   Listen  "Simply be aware of places you feel tight or loose without trying to change anything," Tutin says. For 1 to 2 minutes, imagine each deep breath flowing to that body part. Repeat this process as you move your focus up your body, paying close attention to sensations you feel in each body part. 6. Decompress  Place a warm heat wrap around your neck and shoulders for 10 minutes. Close your eyes and relax your face, neck, upper chest, and back muscles. Remove the wrap, and use a tennis ball or foam roller to massage away tension.  "Place the ball between your back and the wall. Lean into the ball, and hold gentle pressure for up to 15 seconds. Then move the ball to another spot, and apply pressure," says Cathy Benninger, a nurse practitioner and assistant professor at [employer] [facility] in [place]. 7. Laugh Out Loud  A good belly laugh doesn't just lighten the load mentally. It lowers cortisol, your body's stress hormone, and boosts brain chemicals called endorphins, which help   your mood. Lighten up by tuning in to your favorite sitcom or video, reading the comics, or chatting with someone who makes you smile. 8. Crank Up the Tunes  Research shows that listening to soothing music can lower blood pressure, heart rate, and anxiety. "Create a playlist of songs or nature sounds (the ocean, a bubbling brook, birds chirping), and allow your mind to focus on the different melodies, instruments, or singers in the piece," Benninger says. You also can blow off steam by rocking out to more upbeat tunes -- or singing at the top of your lungs! 9. Get Moving   You don't have to run in order to get a runner's high. All forms of exercise, including yoga and walking, can ease depression and anxiety by helping the brain release feel-good chemicals and by giving your body a chance to practice dealing with stress. You can go for a quick walk around the block, take the stairs up and down a few flights, or do some stretching exercises like head rolls and shoulder shrugs. 10. Be Grateful  Keep a gratitude journal or several (one by your bed, one in your purse, and one at work) to help you remember all the things that are good in your life.  "Being grateful for your blessings cancels out negative thoughts and worries," says Joni Emmerling, a wellness coach in [place], [place].  Use these journals to savor good experiences like a child's smile, a sunshine-filled day, and good health. Don't forget to celebrate accomplishments like mastering a new task at work or a new hobby. When you start feeling stressed, spend a few minutes looking through your notes to remind yourself what really matters.  

## 2017-05-20 NOTE — Progress Notes (Signed)
Patient: Julia Lynch Female    DOB: [birth year]   41 y.o.   MRN: [id number] Visit Date: 05/20/2017  Today's Provider: Margaretann Loveless, PA-C   Chief Complaint  Patient presents with  . Follow-up    Hypertension  . Depression  . Anxiety   Subjective:    HPI  Hypertension, follow-up:  BP Readings from Last 3 Encounters:  05/20/17 (!) 148/100  04/21/17 132/88  03/30/17 (!) 136/96    She was last seen for hypertension 4 weeks ago.  BP at that visit was 132/88. Management since that visit includes start HCTZ 12.5 MG She reports reports that she did not take the medication and that her blood pressure been fine at home. She is exercising. She is not adherent to low salt diet.   Outside blood pressures are 135's-140's/88-94. She reports she is going through a hard situation with her husband.She has been having more headaches and has been using her Diazepam more. She is experiencing none.  Patient denies chest pain, chest pressure/discomfort, claudication, exertional chest pressure/discomfort, fatigue, irregular heart beat, lower extremity edema, near-syncope and palpitations.   Cardiovascular risk factors include hypertension, obesity (BMI >= 30 kg/m2) and former smoker quit in 2006.    Weight trend: stable Wt Readings from Last 3 Encounters:  05/20/17 292 lb (132.5 kg)  04/21/17 (!) 301 lb 12.8 oz (136.9 kg)  03/30/17 (!) 306 lb (138.8 kg)    Current diet: in general, a "healthy" diet    ------------------------------------------------------------------------  Depression: Patient complains of depression.She reports she has been diagnosed with depression. It has been stable. She reports that she is having trouble with her husband.They are going to counseling with her husband on Monday.  She complains of depressed mood, difficulty concentrating, fatigue, feelings of worthlessness/guilt, hopelessness and insomnia. Onset was approximately 2 weeks ago, unchanged  since that time.  She denies current suicidal and homicidal plan or intent.   Family history significant for alcoholism, anxiety and substance abuse.Possible organic causes contributing are: situational withher husband.  Risk factors: positive family history in  father and mother Previous treatment includes Lexapro and none. She complains of the following side effects from the treatment: none.  Depression screen PHQ 2/9 05/20/2017  Decreased Interest 3  Down, Depressed, Hopeless 3  PHQ - 2 Score 6  Altered sleeping 2  Tired, decreased energy 3  Change in appetite 2  Feeling bad or failure about yourself  3  Trouble concentrating 2  Moving slowly or fidgety/restless 2  Suicidal thoughts 1  PHQ-9 Score 21  Difficult doing work/chores Very difficult   GAD 7 : Generalized Anxiety Score 05/20/2017  Nervous, Anxious, on Edge 2  Control/stop worrying 3  Worry too much - different things 3  Trouble relaxing 3  Restless 2  Easily annoyed or irritable 2  Afraid - awful might happen 3  Total GAD 7 Score 18  Anxiety Difficulty Somewhat difficult       Allergies  Allergen Reactions  . Ibuprofen     Other reaction(s): Other (See Comments) GI Upset  . Lodine [Etodolac]      Current Outpatient Prescriptions:  .  diazepam (VALIUM) 5 MG tablet, As needed for headaches. No more than 3 in a day, Disp: 30 tablet, Rfl: 5 .  escitalopram (LEXAPRO) 20 MG tablet, Take 1 tablet (20 mg total) by mouth daily., Disp: 90 tablet, Rfl: 2 .  hydrochlorothiazide (MICROZIDE) 12.5 MG capsule, Take 1 capsule (12.5 mg total)  by mouth daily., Disp: 30 capsule, Rfl: 1 .  naproxen sodium (ALEVE) 220 MG tablet, Take by mouth. Reported on 12/15/2015, Disp: , Rfl:  .  omeprazole (PRILOSEC) 20 MG capsule, Take 1 capsule (20 mg total) by mouth daily., Disp: 90 capsule, Rfl: 2 .  cyclobenzaprine (FLEXERIL) 5 MG tablet, Take 1-2 tablets by mouth 2 (two) times daily. Reported on 12/15/2015, Disp: , Rfl:   Review of Systems    Constitutional: Positive for fatigue.  Respiratory: Negative for cough, chest tightness and shortness of breath.   Cardiovascular: Negative for chest pain, palpitations and leg swelling.       She reports only chest tightness-when upset  Gastrointestinal: Negative.   Genitourinary: Negative.   Neurological: Positive for headaches. Negative for dizziness, weakness, light-headedness and numbness.  Psychiatric/Behavioral: Positive for agitation, decreased concentration, dysphoric mood and sleep disturbance. Negative for self-injury and suicidal ideas. The patient is nervous/anxious.     Social History  Substance Use Topics  . Smoking status: Former Smoker    Years: 10.00    Quit date: 10/11/2004  . Smokeless tobacco: Never Used  . Alcohol use No   Objective:   BP (!) 148/100 (BP Location: Left Arm, Patient Position: Sitting, Cuff Size: Large)   Pulse 86   Temp 98.2 F (36.8 C) (Oral)   Resp 16   Wt 292 lb (132.5 kg)   LMP 05/16/2017   BMI 55.17 kg/m    Physical Exam  Constitutional: She appears well-developed and well-nourished. No distress.  Morbidly obese  Neck: Normal range of motion. Neck supple. No JVD present. No tracheal deviation present. No thyromegaly present.  Cardiovascular: Normal rate, regular rhythm and normal heart sounds.  Exam reveals no gallop and no friction rub.   No murmur heard. Pulmonary/Chest: Effort normal and breath sounds normal. No respiratory distress. She has no wheezes. She has no rales.  Lymphadenopathy:    She has no cervical adenopathy.  Skin: She is not diaphoretic.  Psychiatric: Her speech is normal and behavior is normal. Judgment and thought content normal. Her mood appears anxious. Cognition and memory are normal. She exhibits a depressed mood. She expresses no homicidal and no suicidal ideation.  Vitals reviewed.     Assessment & Plan:     1. Situational depression Worsening with having marital issues with her husband. He recently  told her that he was not in love with her and that he wanted an open marriage. They are seeking counseling and start on Monday. Patient is already on lexapro 20mg  at this time and has diazepam 5mg  for headaches. She is to call if symptoms worsen in the meantime. I will see her back in 4 weeks.  2. Anxiety See above medical treatment plan.  3. Generalized headaches Stable. Diagnosis pulled for medication refill. Continue current medical treatment plan. - diazepam (VALIUM) 5 MG tablet; As needed for headaches. No more than 3 in a day  Dispense: 30 tablet; Refill: 5  4. Benign essential HTN Patient did not ever start HCTZ after last visit. She is going to start this. Advised her to take her BP at home and call the office if BP readings remain elevated. I will see her back in 4 weeks.        Margaretann LovelessJennifer M Burnette, PA-C  [facility]

## 2017-05-26 ENCOUNTER — Telehealth: Payer: Self-pay | Admitting: Physician Assistant

## 2017-05-26 NOTE — Telephone Encounter (Signed)
Patient advised as below. Patient verbalizes understanding and is in agreement with treatment plan. Patient reports that she is taking 25 mg of HCTZ daily since Monday.

## 2017-05-26 NOTE — Telephone Encounter (Signed)
This medication could have caused similar symptoms, especially if she was working out hard and may have been dehydrated. Make sure to push fluids. May need to consider going a little slower until she is adjusted to the medication and lower BP.

## 2017-05-26 NOTE — Telephone Encounter (Signed)
Pt called she was just in Friday for her blood pressure and Boneta LucksJenny put her on medication. When she was working out yesterday she felt dizzy and like she was going to throw up when she exercised.  She thinks maybe it could be the medication.    Her call back is [phone]  Thanks teri

## 2017-06-14 ENCOUNTER — Encounter: Payer: Self-pay | Admitting: Physician Assistant

## 2017-06-14 DIAGNOSIS — I10 Essential (primary) hypertension: Secondary | ICD-10-CM

## 2017-06-15 MED ORDER — HYDROCHLOROTHIAZIDE 25 MG PO TABS: 25.0000 mg | ORAL_TABLET | Freq: Every day | ORAL | 3 refills | Status: DC

## 2017-06-28 ENCOUNTER — Encounter: Payer: Self-pay | Admitting: Physician Assistant

## 2017-06-29 NOTE — Telephone Encounter (Signed)
Dr. Beryle Flock can you review this message for [facility] please. Thank Neta Mends, RMA

## 2017-06-29 NOTE — Telephone Encounter (Signed)
It appears that Enid had wanted her to be seen for followup any ways.  She can see Antony Contras next week or someone else this week.  Can we get her scheduled?  Erasmo Downer, MD, MPH [facility] 06/29/2017 9:09 AM

## 2017-07-06 ENCOUNTER — Encounter: Payer: Self-pay | Admitting: Physician Assistant

## 2017-07-06 ENCOUNTER — Ambulatory Visit (INDEPENDENT_AMBULATORY_CARE_PROVIDER_SITE_OTHER): Payer: BLUE CROSS/BLUE SHIELD | Admitting: Physician Assistant

## 2017-07-06 VITALS — BP 110/70 | HR 76 | Temp 98.1°F | Resp 16 | Ht 61.0 in | Wt 282.0 lb

## 2017-07-06 DIAGNOSIS — F4321 Adjustment disorder with depressed mood: Secondary | ICD-10-CM | POA: Diagnosis not present

## 2017-07-06 DIAGNOSIS — I10 Essential (primary) hypertension: Secondary | ICD-10-CM | POA: Diagnosis not present

## 2017-07-06 DIAGNOSIS — F419 Anxiety disorder, unspecified: Secondary | ICD-10-CM | POA: Diagnosis not present

## 2017-07-06 MED ORDER — BUPROPION HCL ER (XL) 150 MG PO TB24: 150.0000 mg | ORAL_TABLET | Freq: Every day | ORAL | 0 refills | Status: DC
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
## 2017-07-06 NOTE — Patient Instructions (Signed)
Bupropion extended-release tablets (Depression/Mood Disorders) What is this medicine? BUPROPION (byoo PROE pee on) is used to treat depression. This medicine may be used for other purposes; ask your health care provider or pharmacist if you have questions. COMMON BRAND NAME(S): Aplenzin, Budeprion XL, Forfivo XL, Wellbutrin XL What should I tell my health care provider before I take this medicine? They need to know if you have any of these conditions: -an eating disorder, such as anorexia or bulimia -bipolar disorder or psychosis -diabetes or high blood sugar, treated with medication -glaucoma -head injury or brain tumor -heart disease, previous heart attack, or irregular heart beat -high blood pressure -kidney or liver disease -seizures (convulsions) -suicidal thoughts or a previous suicide attempt -Tourette's syndrome -weight loss -an unusual or allergic reaction to bupropion, other medicines, foods, dyes, or preservatives -breast-feeding -pregnant or trying to become pregnant How should I use this medicine? Take this medicine by mouth with a glass of water. Follow the directions on the prescription label. You can take it with or without food. If it upsets your stomach, take it with food. Do not crush, chew, or cut these tablets. This medicine is taken once daily at the same time each day. Do not take your medicine more often than directed. Do not stop taking this medicine suddenly except upon the advice of your doctor. Stopping this medicine too quickly may cause serious side effects or your condition may worsen. A special MedGuide will be given to you by the pharmacist with each prescription and refill. Be sure to read this information carefully each time. Talk to your pediatrician regarding the use of this medicine in children. Special care may be needed. Overdosage: If you think you have taken too much of this medicine contact a poison control center or emergency room at once. NOTE:  This medicine is only for you. Do not share this medicine with others. What if I miss a dose? If you miss a dose, skip the missed dose and take your next tablet at the regular time. Do not take double or extra doses. What may interact with this medicine? Do not take this medicine with any of the following medications: -linezolid -MAOIs like Azilect, Carbex, Eldepryl, Marplan, Nardil, and Parnate -methylene blue (injected into a vein) -other medicines that contain bupropion like Zyban This medicine may also interact with the following medications: -alcohol -certain medicines for anxiety or sleep -certain medicines for blood pressure like metoprolol, propranolol -certain medicines for depression or psychotic disturbances -certain medicines for HIV or AIDS like efavirenz, lopinavir, nelfinavir, ritonavir -certain medicines for irregular heart beat like propafenone, flecainide -certain medicines for Parkinson's disease like amantadine, levodopa -certain medicines for seizures like carbamazepine, phenytoin, phenobarbital -cimetidine -clopidogrel -cyclophosphamide -digoxin -furazolidone -isoniazid -nicotine -orphenadrine -procarbazine -steroid medicines like prednisone or cortisone -stimulant medicines for attention disorders, weight loss, or to stay awake -tamoxifen -theophylline -thiotepa -ticlopidine -tramadol -warfarin This list may not describe all possible interactions. Give your health care provider a list of all the medicines, herbs, non-prescription drugs, or dietary supplements you use. Also tell them if you smoke, drink alcohol, or use illegal drugs. Some items may interact with your medicine. What should I watch for while using this medicine? Tell your doctor if your symptoms do not get better or if they get worse. Visit your doctor or health care professional for regular checks on your progress. Because it may take several weeks to see the full effects of this medicine, it  is important to continue your treatment as   prescribed by your doctor. Patients and their families should watch out for new or worsening thoughts of suicide or depression. Also watch out for sudden changes in feelings such as feeling anxious, agitated, panicky, irritable, hostile, aggressive, impulsive, severely restless, overly excited and hyperactive, or not being able to sleep. If this happens, especially at the beginning of treatment or after a change in dose, call your health care professional. Avoid alcoholic drinks while taking this medicine. Drinking large amounts of alcoholic beverages, using sleeping or anxiety medicines, or quickly stopping the use of these agents while taking this medicine may increase your risk for a seizure. Do not drive or use heavy machinery until you know how this medicine affects you. This medicine can impair your ability to perform these tasks. Do not take this medicine close to bedtime. It may prevent you from sleeping. Your mouth may get dry. Chewing sugarless gum or sucking hard candy, and drinking plenty of water may help. Contact your doctor if the problem does not go away or is severe. The tablet shell for some brands of this medicine does not dissolve. This is normal. The tablet shell may appear whole in the stool. This is not a cause for concern. What side effects may I notice from receiving this medicine? Side effects that you should report to your doctor or health care professional as soon as possible: -allergic reactions like skin rash, itching or hives, swelling of the face, lips, or tongue -breathing problems -changes in vision -confusion -elevated mood, decreased need for sleep, racing thoughts, impulsive behavior -fast or irregular heartbeat -hallucinations, loss of contact with reality -increased blood pressure -redness, blistering, peeling or loosening of the skin, including inside the mouth -seizures -suicidal thoughts or other mood  changes -unusually weak or tired -vomiting Side effects that usually do not require medical attention (report to your doctor or health care professional if they continue or are bothersome): -constipation -headache -loss of appetite -nausea -tremors -weight loss This list may not describe all possible side effects. Call your doctor for medical advice about side effects. You may report side effects to FDA at 1-800-FDA-1088. Where should I keep my medicine? Keep out of the reach of children. Store at room temperature between 15 and 30 degrees C (59 and 86 degrees F). Throw away any unused medicine after the expiration date. NOTE: This sheet is a summary. It may not cover all possible information. If you have questions about this medicine, talk to your doctor, pharmacist, or health care provider.  2018 Elsevier/Gold Standard (2016-03-19 13:55:13)  

## 2017-07-21 ENCOUNTER — Encounter: Payer: BLUE CROSS/BLUE SHIELD | Admitting: Obstetrics and Gynecology

## 2017-07-27 ENCOUNTER — Other Ambulatory Visit: Payer: Self-pay | Admitting: Obstetrics and Gynecology

## 2017-07-27 ENCOUNTER — Ambulatory Visit (INDEPENDENT_AMBULATORY_CARE_PROVIDER_SITE_OTHER): Payer: BLUE CROSS/BLUE SHIELD | Admitting: Obstetrics and Gynecology

## 2017-07-27 ENCOUNTER — Encounter: Payer: Self-pay | Admitting: Obstetrics and Gynecology

## 2017-07-27 VITALS — BP 138/78 | HR 80 | Ht 61.0 in | Wt 277.9 lb

## 2017-07-27 DIAGNOSIS — Z113 Encounter for screening for infections with a predominantly sexual mode of transmission: Secondary | ICD-10-CM

## 2017-07-27 DIAGNOSIS — Z01419 Encounter for gynecological examination (general) (routine) without abnormal findings: Secondary | ICD-10-CM

## 2017-07-27 NOTE — Progress Notes (Signed)
Subjective:   Julia Lynch is a 41 y.o. G63P0 Caucasian female here for a routine well-woman exam.  Patient's last menstrual period was 07/16/2017.    Current complaints: depression due to separation from spouse(due to him cheating with neighbor) in counseling. 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  Past Medical History Past Medical History:  Diagnosis Date  . Anxiety   . Arthritis   . Depression   . GERD (gastroesophageal reflux disease)   . History of chicken pox   . PCOS (polycystic ovarian syndrome)   . Vitamin D deficiency disease     Past Surgical History Past Surgical History:  Procedure Laterality Date  . left ankle repair  1995    Gynecologic History G3P0  Patient's last menstrual period was 07/16/2017. Contraception: tubal ligation Last Pap: 2016. Results were: normal   Obstetric History OB History  Gravida Para Term Preterm AB Living  3         3  SAB TAB Ectopic Multiple Live Births          3    # Outcome Date GA Lbr Len/2nd Weight Sex Delivery Anes PTL Lv  3 Gravida 2013    M Vag-Spont   LIV  2 Gravida 2012    M Vag-Spont   LIV  1 Gravida 2007    M Vag-Spont   LIV      Current Medications Current Outpatient Prescriptions on File Prior to Visit  Medication Sig Dispense Refill  . buPROPion (WELLBUTRIN XL) 150 MG 24 hr tablet Take 1 tablet (150 mg total) by mouth daily. 30 tablet 0  . cyclobenzaprine (FLEXERIL) 5 MG tablet Take 1-2 tablets by mouth 2 (two) times daily. Reported on 12/15/2015    .  diazepam (VALIUM) 5 MG tablet As needed for headaches. No more than 3 in a day 30 tablet 5  . escitalopram (LEXAPRO) 20 MG tablet Take 1 tablet (20 mg total) by mouth daily. 90 tablet 2  . hydrochlorothiazide (HYDRODIURIL) 25 MG tablet Take 1 tablet (25 mg total) by mouth daily. 90 tablet 3  . naproxen sodium (ALEVE) 220 MG tablet Take by mouth. Reported on 12/15/2015    . omeprazole (PRILOSEC) 20 MG capsule Take 1 capsule (20 mg total) by mouth daily. 90 capsule 2   No current facility-administered medications on file prior to visit.     Review of Systems Patient denies any headaches, blurred vision, shortness of breath, chest pain, abdominal pain, problems with bowel movements, urination, or intercourse.  Objective:  BP 138/78   Pulse 80   Ht 5\' 1"  (1.549 m)   Wt 277 lb 14.4 oz (126.1 kg)   LMP 07/16/2017   BMI 52.51 kg/m  Physical Exam  General:  Well developed, well nourished, no acute distress. She is alert and oriented x3. Skin:  Warm and dry Neck:  Midline trachea, no thyromegaly or nodules Cardiovascular: Regular rate and rhythm, no murmur heard Lungs:  Effort normal, all lung fields clear to auscultation bilaterally Breasts:  No dominant palpable mass, retraction, or nipple discharge Abdomen:  Soft, non tender,  no hepatosplenomegaly or masses Pelvic:  External genitalia is normal in appearance.  The vagina is normal in appearance. The cervix is bulbous, no CMT.  Thin prep pap is done with HR HPV cotesting. Uterus is felt to be normal size, shape, and contour.  No adnexal masses or tenderness noted. Extremities:  No swelling or varicosities noted Psych:  She has a normal mood and affect  Assessment:   Healthy well-woman exam STI screening Obesity Depression   Plan:  STI testing per request F/U 1 year for AE, or sooner if needed Mammogram ordered  Melody Suzan NailerN Shambley, CNM

## 2017-07-27 NOTE — Patient Instructions (Signed)
Preventive Care 18-39 Years, Female Preventive care refers to lifestyle choices and visits with your health care provider that can promote health and wellness. What does preventive care include?  A yearly physical exam. This is also called an annual well check.  Dental exams once or twice a year.  Routine eye exams. Ask your health care provider how often you should have your eyes checked.  Personal lifestyle choices, including: ? Daily care of your teeth and gums. ? Regular physical activity. ? Eating a healthy diet. ? Avoiding tobacco and drug use. ? Limiting alcohol use. ? Practicing safe sex. ? Taking vitamin and mineral supplements as recommended by your health care provider. What happens during an annual well check? The services and screenings done by your health care provider during your annual well check will depend on your age, overall health, lifestyle risk factors, and family history of disease. Counseling Your health care provider may ask you questions about your:  Alcohol use.  Tobacco use.  Drug use.  Emotional well-being.  Home and relationship well-being.  Sexual activity.  Eating habits.  Work and work Statistician.  Method of birth control.  Menstrual cycle.  Pregnancy history.  Screening You may have the following tests or measurements:  Height, weight, and BMI.  Diabetes screening. This is done by checking your blood sugar (glucose) after you have not eaten for a while (fasting).  Blood pressure.  Lipid and cholesterol levels. These may be checked every 5 years starting at age 38.  Skin check.  Hepatitis C blood test.  Hepatitis B blood test.  Sexually transmitted disease (STD) testing.  BRCA-related cancer screening. This may be done if you have a family history of breast, ovarian, tubal, or peritoneal cancers.  Pelvic exam and Pap test. This may be done every 3 years starting at age 38. Starting at age 30, this may be done  every 5 years if you have a Pap test in combination with an HPV test.  Discuss your test results, treatment options, and if necessary, the need for more tests with your health care provider. Vaccines Your health care provider may recommend certain vaccines, such as:  Influenza vaccine. This is recommended every year.  Tetanus, diphtheria, and acellular pertussis (Tdap, Td) vaccine. You may need a Td booster every 10 years.  Varicella vaccine. You may need this if you have not been vaccinated.  HPV vaccine. If you are 39 or younger, you may need three doses over 6 months.  Measles, mumps, and rubella (MMR) vaccine. You may need at least one dose of MMR. You may also need a second dose.  Pneumococcal 13-valent conjugate (PCV13) vaccine. You may need this if you have certain conditions and were not previously vaccinated.  Pneumococcal polysaccharide (PPSV23) vaccine. You may need one or two doses if you smoke cigarettes or if you have certain conditions.  Meningococcal vaccine. One dose is recommended if you are age 68-21 years and a first-year college student living in a residence hall, or if you have one of several medical conditions. You may also need additional booster doses.  Hepatitis A vaccine. You may need this if you have certain conditions or if you travel or work in places where you may be exposed to hepatitis A.  Hepatitis B vaccine. You may need this if you have certain conditions or if you travel or work in places where you may be exposed to hepatitis B.  Haemophilus influenzae type b (Hib) vaccine. You may need this  if you have certain risk factors.  Talk to your health care provider about which screenings and vaccines you need and how often you need them. This information is not intended to replace advice given to you by your health care provider. Make sure you discuss any questions you have with your health care provider. Document Released: 11/23/2001 Document Revised:  06/16/2016 Document Reviewed: 07/29/2015 Elsevier Interactive Patient Education  2017 Elsevier Inc.  

## 2017-07-28 ENCOUNTER — Other Ambulatory Visit: Payer: Self-pay | Admitting: Obstetrics and Gynecology

## 2017-07-28 DIAGNOSIS — E119 Type 2 diabetes mellitus without complications: Secondary | ICD-10-CM | POA: Insufficient documentation

## 2017-07-28 DIAGNOSIS — E139 Other specified diabetes mellitus without complications: Secondary | ICD-10-CM

## 2017-07-28 LAB — HEMOGLOBIN A1C
Est. average glucose Bld gHb Est-mCnc: 146 mg/dL
Hgb A1c MFr Bld: 6.7 % — ABNORMAL HIGH (ref 4.8–5.6)

## 2017-07-28 LAB — HSV 1 AND 2 IGM ABS, INDIRECT

## 2017-07-28 LAB — HEPATITIS PANEL, ACUTE
HEP B C IGM: NEGATIVE
HEP B S AG: NEGATIVE
Hep A IgM: NEGATIVE

## 2017-07-28 LAB — COMPREHENSIVE METABOLIC PANEL
A/G RATIO: 1.4 (ref 1.2–2.2)
ALT: 20 IU/L (ref 0–32)
AST: 18 IU/L (ref 0–40)
Albumin: 4.2 g/dL (ref 3.5–5.5)
Alkaline Phosphatase: 92 IU/L (ref 39–117)
BILIRUBIN TOTAL: 0.3 mg/dL (ref 0.0–1.2)
BUN / CREAT RATIO: 17 (ref 9–23)
BUN: 12 mg/dL (ref 6–24)
CALCIUM: 9.5 mg/dL (ref 8.7–10.2)
CHLORIDE: 99 mmol/L (ref 96–106)
CO2: 27 mmol/L (ref 20–29)
Creatinine, Ser: 0.72 mg/dL (ref 0.57–1.00)
GFR, EST AFRICAN AMERICAN: 121 mL/min/{1.73_m2} (ref >59)
GFR, EST NON AFRICAN AMERICAN: 105 mL/min/{1.73_m2} (ref >59)
GLOBULIN, TOTAL: 3 g/dL (ref 1.5–4.5)
Glucose: 147 mg/dL — ABNORMAL HIGH (ref 65–99)
POTASSIUM: 4.4 mmol/L (ref 3.5–5.2)
SODIUM: 140 mmol/L (ref 134–144)
TOTAL PROTEIN: 7.2 g/dL (ref 6.0–8.5)

## 2017-07-28 LAB — VITAMIN D 25 HYDROXY (VIT D DEFICIENCY, FRACTURES): VIT D 25 HYDROXY: 27.4 ng/mL — AB (ref 30.0–100.0)

## 2017-07-28 LAB — RPR: RPR Ser Ql: NONREACTIVE

## 2017-07-28 LAB — THYROID PANEL WITH TSH
Free Thyroxine Index: 1.9 (ref 1.2–4.9)
T3 Uptake Ratio: 24 % (ref 24–39)
T4, Total: 7.8 ug/dL (ref 4.5–12.0)
TSH: 1.11 u[IU]/mL (ref 0.450–4.500)

## 2017-07-28 LAB — LIPID PANEL
Chol/HDL Ratio: 4.7 ratio — ABNORMAL HIGH (ref 0.0–4.4)
Cholesterol, Total: 199 mg/dL (ref 100–199)
HDL: 42 mg/dL (ref >39)
LDL CALC: 106 mg/dL — AB (ref 0–99)
TRIGLYCERIDES: 255 mg/dL — AB (ref 0–149)
VLDL Cholesterol Cal: 51 mg/dL — ABNORMAL HIGH (ref 5–40)

## 2017-07-28 LAB — HIV ANTIBODY (ROUTINE TESTING W REFLEX): HIV Screen 4th Generation wRfx: NONREACTIVE

## 2017-07-28 MED ORDER — VITAMIN D (ERGOCALCIFEROL) 1.25 MG (50000 UNIT) PO CAPS: 50000.0000 [IU] | ORAL_CAPSULE | ORAL | 1 refills | Status: DC

## 2017-08-03 ENCOUNTER — Encounter: Payer: Self-pay | Admitting: Physician Assistant

## 2017-08-03 ENCOUNTER — Ambulatory Visit (INDEPENDENT_AMBULATORY_CARE_PROVIDER_SITE_OTHER): Payer: BLUE CROSS/BLUE SHIELD | Admitting: Physician Assistant

## 2017-08-03 VITALS — BP 130/86 | HR 82 | Temp 98.3°F | Resp 16 | Wt 281.0 lb

## 2017-08-03 DIAGNOSIS — E119 Type 2 diabetes mellitus without complications: Secondary | ICD-10-CM | POA: Diagnosis not present

## 2017-08-03 DIAGNOSIS — F4321 Adjustment disorder with depressed mood: Secondary | ICD-10-CM | POA: Diagnosis not present

## 2017-08-03 DIAGNOSIS — F419 Anxiety disorder, unspecified: Secondary | ICD-10-CM

## 2017-08-03 DIAGNOSIS — R0683 Snoring: Secondary | ICD-10-CM | POA: Diagnosis not present

## 2017-08-03 DIAGNOSIS — R5383 Other fatigue: Secondary | ICD-10-CM | POA: Diagnosis not present

## 2017-08-03 DIAGNOSIS — R400 Somnolence: Secondary | ICD-10-CM

## 2017-08-03 MED ORDER — BUPROPION HCL ER (XL) 150 MG PO TB24: 150.0000 mg | ORAL_TABLET | Freq: Every day | ORAL | 1 refills | Status: DC

## 2017-08-03 NOTE — Progress Notes (Signed)
Patient: Julia Lynch Female    DOB: December 12, 1975   41 y.o.   MRN: [id number] Visit Date: 08/03/2017  Today's Provider: Margaretann Loveless, PA-C   No chief complaint on file.  Subjective:    HPI  Depression/Anxiety, Follow-up  She  was last seen for this 4 weeks ago. Changes made at last visit include started Wellbutrin XL 150 mg. Patient advised to continue taking Lexapro 20 mg daily and Diazepam 5 mg PRN and continue counseling.              She reports excellent compliance with treatment. She is not having side effects.   She reports good tolerance of treatment. Current symptoms include: depressed mood, difficulty concentrating, fatigue and insomnia She feels she is worse since last visit. She reports that since last visit her husband has moved out to his own place. This has been difficult with the 3 boys trying to understand why he isn't there in the evenings. He has still been helpful when he is needed for the boys. She is still going to counseling and she recently reached out to her counselor to see if she can bring her boys for counseling as well to help them understand the situation.   Follow up for labs   The patient was last seen for this 1 weeks ago. Changes made at last visit include check labs at [facility], [place], [place]. Patient reports that she was advised to talk to PCP about A1c result. A1c was 6.7.  Patient is also complaining of worsening daytime fatigue. She reports that she can fall asleep, sleep all night, but then still feel exhausted when she awakes. She does report loud snoring. She denies any witnessed apnea. She is morbidly obese, but has been working on losing weight. She is also having daily headaches. Unsure if due to feeling fatigued or from stress of family situation.     Previous Medications   BUPROPION (WELLBUTRIN XL) 150 MG 24 HR TABLET    Take 1 tablet (150 mg total) by mouth daily.   CYCLOBENZAPRINE (FLEXERIL) 5 MG TABLET    Take 1-2  tablets by mouth 2 (two) times daily. Reported on 12/15/2015   DIAZEPAM (VALIUM) 5 MG TABLET    As needed for headaches. No more than 3 in a day   ESCITALOPRAM (LEXAPRO) 20 MG TABLET    Take 1 tablet (20 mg total) by mouth daily.   HYDROCHLOROTHIAZIDE (HYDRODIURIL) 25 MG TABLET    Take 1 tablet (25 mg total) by mouth daily.   NAPROXEN SODIUM (ALEVE) 220 MG TABLET    Take by mouth. Reported on 12/15/2015   OMEPRAZOLE (PRILOSEC) 20 MG CAPSULE    Take 1 capsule (20 mg total) by mouth daily.   VITAMIN D, ERGOCALCIFEROL, (DRISDOL) 50000 UNITS CAPS CAPSULE    Take 1 capsule (50,000 Units total) by mouth 2 (two) times a week.    Review of Systems  Constitutional: Negative.   Respiratory: Negative.   Cardiovascular: Negative.   Psychiatric/Behavioral: Positive for dysphoric mood. The patient is nervous/anxious.     Social History  Substance Use Topics  . Smoking status: Former Smoker    Years: 10.00    Quit date: 10/11/2004  . Smokeless tobacco: Never Used  . Alcohol use No   Objective:   LMP 07/16/2017   Physical Exam  Constitutional: She appears well-developed and well-nourished. No distress.  Morbidly obese  Neck: Normal range of motion. Neck supple. No JVD present. No tracheal deviation present.  No thyromegaly present.  Cardiovascular: Normal rate, regular rhythm and normal heart sounds.  Exam reveals no gallop and no friction rub.   No murmur heard. Pulmonary/Chest: Effort normal and breath sounds normal. No respiratory distress. She has no wheezes. She has no rales.  Lymphadenopathy:    She has no cervical adenopathy.  Skin: She is not diaphoretic.  Psychiatric: Her speech is normal and behavior is normal. Judgment and thought content normal. Cognition and memory are normal. She exhibits a depressed mood.  Vitals reviewed.      Assessment & Plan:     1. Situational depression Stable. Continue lexapro and wellbutrin. I will see her back in 3 months. - buPROPion (WELLBUTRIN XL)  150 MG 24 hr tablet; Take 1 tablet (150 mg total) by mouth daily.  Dispense: 90 tablet; Refill: 1  2. Anxiety See above medical treatment plan. - buPROPion (WELLBUTRIN XL) 150 MG 24 hr tablet; Take 1 tablet (150 mg total) by mouth daily.  Dispense: 90 tablet; Refill: 1  3. Daytime somnolence Worsening. Will refer for sleep study to r/o sleep apnea as source. I will f/u pending results.  - Ambulatory referral to Sleep Studies  4. Fatigue, unspecified type See above medical treatment plan. - Ambulatory referral to Sleep Studies  5. Snoring See above medical treatment plan. - Ambulatory referral to Sleep Studies  6. Type 2 diabetes mellitus without complication, without long-term current use of insulin (HCC) A1c at 6.7. Patient has been known diabetic but controlled with lifestyle changes. Discussed progression of disease. Will recheck A1c in 6 months. If A1c increases even with lifestyle changes we will start metformin.    Follow up: No Follow-up on file.

## 2017-08-04 NOTE — Patient Instructions (Signed)

## 2017-08-08 ENCOUNTER — Telehealth: Payer: Self-pay | Admitting: Physician Assistant

## 2017-08-08 NOTE — Telephone Encounter (Signed)
Order for split night sleep study faxed to [facility] °

## 2017-08-09 LAB — CYTOLOGY - PAP

## 2017-08-10 ENCOUNTER — Telehealth: Payer: Self-pay | Admitting: *Deleted

## 2017-08-10 NOTE — Telephone Encounter (Signed)
Mailed pt all results

## 2017-08-10 NOTE — Telephone Encounter (Signed)
-----   Message from Purcell NailsMelody N Shambley, [place] sent at 08/10/2017 10:15 AM EDT ----- Please mail info on pap -ASCUS,HPV-

## 2017-08-17 ENCOUNTER — Ambulatory Visit
Admission: RE | Admit: 2017-08-17 | Discharge: 2017-08-17 | Disposition: A | Discharge status: Home / Self Care | Payer: BLUE CROSS/BLUE SHIELD | Source: Ambulatory Visit | Attending: Obstetrics and Gynecology | Admitting: Obstetrics and Gynecology

## 2017-08-17 ENCOUNTER — Encounter: Payer: Self-pay | Admitting: Physician Assistant

## 2017-08-17 DIAGNOSIS — Z1231 Encounter for screening mammogram for malignant neoplasm of breast: Secondary | ICD-10-CM | POA: Diagnosis not present

## 2017-08-17 DIAGNOSIS — Z01419 Encounter for gynecological examination (general) (routine) without abnormal findings: Secondary | ICD-10-CM

## 2017-08-26 ENCOUNTER — Encounter: Payer: Self-pay | Admitting: Physician Assistant

## 2017-08-26 ENCOUNTER — Telehealth: Payer: Self-pay | Admitting: Physician Assistant

## 2017-08-26 NOTE — Telephone Encounter (Signed)
Patient had in-lab sleep study denied by her insurance company.   Can we try to get an in-home sleep study.  Thanks! JB

## 2017-08-29 NOTE — Telephone Encounter (Signed)
[facility]

## 2017-08-31 NOTE — Telephone Encounter (Signed)
Order for home sleep study faxed to [facility] °

## 2017-09-15 ENCOUNTER — Encounter: Payer: Self-pay | Admitting: Physician Assistant

## 2017-09-23 ENCOUNTER — Encounter: Payer: Self-pay | Admitting: Family Medicine

## 2017-09-25 ENCOUNTER — Other Ambulatory Visit: Payer: Self-pay | Admitting: Family Medicine

## 2017-09-30 ENCOUNTER — Encounter: Payer: Self-pay | Admitting: Physician Assistant

## 2017-09-30 ENCOUNTER — Ambulatory Visit (INDEPENDENT_AMBULATORY_CARE_PROVIDER_SITE_OTHER): Payer: BLUE CROSS/BLUE SHIELD | Admitting: Physician Assistant

## 2017-09-30 VITALS — BP 128/70 | HR 78 | Temp 97.9°F | Resp 16 | Wt 284.2 lb

## 2017-09-30 DIAGNOSIS — R7981 Abnormal blood-gas level: Secondary | ICD-10-CM

## 2017-09-30 DIAGNOSIS — G4733 Obstructive sleep apnea (adult) (pediatric): Secondary | ICD-10-CM | POA: Insufficient documentation

## 2017-09-30 NOTE — Patient Instructions (Signed)

## 2017-09-30 NOTE — Progress Notes (Signed)
Patient: Julia Lynch Female    DOB: 12/12/1975   41 y.o.   MRN: [id number] Visit Date: 09/30/2017  Today's Provider: Margaretann LovelessJennifer M Burnette, PA-C   Chief Complaint  Patient presents with  . Follow-up    to discuss sleep study   Subjective:    HPI Patient is here today to discuss her sleep study. The sleep study was done 09/07/17. Sleep study was positive for OSA. She reports her CPAP machine should be delivered today. During the work up for the sleep study she had to ambulate with the pulse oximeter. It was noted while ambulating her O2 sat dropped to 88%. Spirometry was normal. She had been exercising prior doing high intensity training, boot camps and running. She reports having occasional moments during exercise where she would have to stop to catch her breath and occasional lightheadedness. Never has had LOC/syncope or chest pain with exercise. No history of asthma. It was recommended for her to undergo a stress test to r/o cardiac involvement.      Allergies  Allergen Reactions  . Ibuprofen     Other reaction(s): Other (See Comments) GI Upset  . Lodine [Etodolac]      Current Outpatient Medications:  .  buPROPion (WELLBUTRIN XL) 150 MG 24 hr tablet, Take 1 tablet (150 mg total) by mouth daily., Disp: 90 tablet, Rfl: 1 .  cyclobenzaprine (FLEXERIL) 5 MG tablet, Take 1-2 tablets by mouth 2 (two) times daily. Reported on 12/15/2015, Disp: , Rfl:  .  diazepam (VALIUM) 5 MG tablet, As needed for headaches. No more than 3 in a day, Disp: 30 tablet, Rfl: 5 .  escitalopram (LEXAPRO) 20 MG tablet, Take 1 tablet (20 mg total) by mouth daily., Disp: 90 tablet, Rfl: 2 .  hydrochlorothiazide (HYDRODIURIL) 25 MG tablet, Take 1 tablet (25 mg total) by mouth daily., Disp: 90 tablet, Rfl: 3 .  naproxen sodium (ALEVE) 220 MG tablet, Take by mouth. Reported on 12/15/2015, Disp: , Rfl:  .  omeprazole (PRILOSEC) 20 MG capsule, TAKE 1 CAPSULE (20 MG TOTAL) BY MOUTH DAILY., Disp: 90 capsule,  Rfl: 4 .  Vitamin D, Ergocalciferol, (DRISDOL) 50000 units CAPS capsule, Take 1 capsule (50,000 Units total) by mouth 2 (two) times a week., Disp: 30 capsule, Rfl: 1  Review of Systems  Constitutional: Positive for fatigue.  Respiratory: Negative for cough, chest tightness, shortness of breath and wheezing.   Cardiovascular: Negative for chest pain, palpitations and leg swelling.  Gastrointestinal: Negative for abdominal pain.  Neurological: Negative for dizziness, light-headedness and headaches.    Social History   Tobacco Use  . Smoking status: Former Smoker    Years: 10.00    Last attempt to quit: 10/11/2004    Years since quitting: 12.9  . Smokeless tobacco: Never Used  Substance Use Topics  . Alcohol use: No    Alcohol/week: 0.0 oz   Objective:   BP 128/70 (BP Location: Left Arm, Patient Position: Sitting, Cuff Size: Large)   Pulse 78   Temp 97.9 F (36.6 C) (Oral)   Resp 16   Wt 284 lb 3.2 oz (128.9 kg)   SpO2 98%   BMI 53.70 kg/m    Physical Exam  Constitutional: She appears well-developed and well-nourished. No distress.  Morbidly obese  Neck: Normal range of motion. Neck supple.  Cardiovascular: Normal rate, regular rhythm and normal heart sounds. Exam reveals no gallop and no friction rub.  No murmur heard. Pulmonary/Chest: Effort normal and breath sounds  normal. No respiratory distress. She has no wheezes. She has no rales.  Skin: She is not diaphoretic.  Vitals reviewed.      Assessment & Plan:     1. Low O2 saturation with ambulation Will refer to cardiology for exercise stress testing as below. Patient may continue to do light walking and light resistance training at this time. No running or HIIT until stress test is done. I will f/u pending results.  - Ambulatory referral to Cardiology  2. Morbidly obese (HCC) See above medical treatment plan. - Ambulatory referral to Cardiology  3. Obstructive sleep apnea CPAP to be delivered today. Will  evaluate effectiveness in 3 months.  - Ambulatory referral to Cardiology       Margaretann LovelessJennifer M Burnette, PA-C  [facility]

## 2017-11-01 ENCOUNTER — Encounter: Payer: Self-pay | Admitting: Cardiovascular Disease

## 2017-11-01 ENCOUNTER — Ambulatory Visit (INDEPENDENT_AMBULATORY_CARE_PROVIDER_SITE_OTHER): Payer: BLUE CROSS/BLUE SHIELD | Admitting: Cardiovascular Disease

## 2017-11-01 VITALS — BP 140/86 | HR 83 | Ht 61.0 in | Wt 283.5 lb

## 2017-11-01 DIAGNOSIS — G4733 Obstructive sleep apnea (adult) (pediatric): Secondary | ICD-10-CM | POA: Diagnosis not present

## 2017-11-01 DIAGNOSIS — E119 Type 2 diabetes mellitus without complications: Secondary | ICD-10-CM

## 2017-11-01 DIAGNOSIS — I10 Essential (primary) hypertension: Secondary | ICD-10-CM | POA: Diagnosis not present

## 2017-11-01 DIAGNOSIS — R079 Chest pain, unspecified: Secondary | ICD-10-CM

## 2017-11-01 DIAGNOSIS — R0602 Shortness of breath: Secondary | ICD-10-CM | POA: Diagnosis not present

## 2017-11-01 NOTE — Patient Instructions (Signed)
Medication Instructions:   No medication changes made  Labwork:  No new labs needed  Testing/Procedures:  No further testing at this time   Follow-Up: It was a pleasure seeing you in the office today. Please call us if you have new issues that need to be addressed before your next appt.  [phone]  Your physician wants you to follow-up in:  As needed  If you need a refill on your cardiac medications before your next appointment, please call your pharmacy.     

## 2017-11-01 NOTE — Progress Notes (Signed)
Cardiology Office Note  Date:  11/01/2017   ID:  Julia Lynch, Julia 1976/09/11, MRN [id number]  PCP:  Malva Limes, MD   Chief Complaint  Patient presents with  . New Patient (Initial Visit)    Referred by Joycelyn Man PA-C for Low O2 saturation. Patient c/o chest pain when she feels stressed. Meds reviewed verbally with patient.     HPI:  Ms. Lynch is a 42 year old woman with past medical history of Morbid obesity Remote smoking, stopped 13 years ago, 1997 to 2006 Obstructive sleep apnea, on CPAP, Who presents by referral from Eliezer Bottom for consultation of her chest pain, hypoxia   sleep study was done 09/07/17.  Results discussed with her in detail positive for OSA.  She is using her CPAP machine, sometimes comes off in the middle of the night  Minimal desaturation, 1% of the time less than 90% 4% of the time 92-90%  She reports that respiratory therapist had her walk around her apartment and reported that her oxygen saturation was 88%. She did not feel short of breath, was not struggling. He recommended that she be evaluated given her numbers  Typically exercises doing boot camp, likes to go walk on her treadmill Denies significant chest pain with exertion Occasionally with shortness of breath which she feels is appropriate for heavy exercise  EKG personally reviewed by myself on todays visit Shows normal sinus rhythm rate 83 bpm no significant ST or T-wave changes   PMH:   has a past medical history of Anxiety, Arthritis, Depression, GERD (gastroesophageal reflux disease), History of chicken pox, PCOS (polycystic ovarian syndrome), and Vitamin D deficiency disease.  PSH:    Past Surgical History:  Procedure Laterality Date  . left ankle repair  1995    Current Outpatient Medications  Medication Sig Dispense Refill  . buPROPion (WELLBUTRIN XL) 150 MG 24 hr tablet Take 1 tablet (150 mg total) by mouth daily. 90 tablet 1  . cyclobenzaprine  (FLEXERIL) 5 MG tablet Take 1-2 tablets by mouth 2 (two) times daily. Reported on 12/15/2015    . diazepam (VALIUM) 5 MG tablet As needed for headaches. No more than 3 in a day 30 tablet 5  . escitalopram (LEXAPRO) 20 MG tablet Take 1 tablet (20 mg total) by mouth daily. 90 tablet 2  . hydrochlorothiazide (HYDRODIURIL) 25 MG tablet Take 1 tablet (25 mg total) by mouth daily. 90 tablet 3  . naproxen sodium (ALEVE) 220 MG tablet Take by mouth. Reported on 12/15/2015    . omeprazole (PRILOSEC) 20 MG capsule TAKE 1 CAPSULE (20 MG TOTAL) BY MOUTH DAILY. 90 capsule 4  . Vitamin D, Ergocalciferol, (DRISDOL) 50000 units CAPS capsule Take 1 capsule (50,000 Units total) by mouth 2 (two) times a week. 30 capsule 1   No current facility-administered medications for this visit.      Allergies:   Ibuprofen and Lodine [etodolac]   Social History:  The patient  reports that she quit smoking about 13 years ago. She quit after 10.00 years of use. she has never used smokeless tobacco. She reports that she drinks alcohol. She reports that she does not use drugs.   Family History:   family history includes Diabetes in her father and paternal grandmother; Heart attack in her maternal grandfather; Hepatitis C in her mother; Hypertension in her father and paternal grandmother.    Review of Systems: Review of Systems  Constitutional: Negative.   Respiratory: Negative.   Cardiovascular: Negative.   Gastrointestinal: Negative.  Musculoskeletal: Negative.   Neurological: Negative.   Psychiatric/Behavioral: Negative.   All other systems reviewed and are negative.    PHYSICAL EXAM: VS:  BP 140/86 (BP Location: Right Arm, Patient Position: Sitting, Cuff Size: Large)   Pulse 83   Ht 5\' 1"  (1.549 m)   Wt 283 lb 8 oz (128.6 kg)   BMI 53.57 kg/m  , BMI Body mass index is 53.57 kg/m. GEN: Well nourished, well developed, in no acute distress , obese HEENT: normal  Neck: no JVD, carotid bruits, or masses Cardiac:  RRR; no murmurs, rubs, or gallops,no edema  Respiratory:  clear to auscultation bilaterally, normal work of breathing GI: soft, nontender, nondistended, + BS MS: no deformity or atrophy  Skin: warm and dry, no rash Neuro:  Strength and sensation are intact Psych: euthymic mood, full affect    Recent Labs: 07/27/2017: ALT 20; BUN 12; Creatinine, Ser 0.72; Potassium 4.4; Sodium 140; TSH 1.110    Lipid Panel Lab Results  Component Value Date   CHOL 199 07/27/2017   HDL 42 07/27/2017   LDLCALC 106 (H) 07/27/2017   TRIG 255 (H) 07/27/2017      Wt Readings from Last 3 Encounters:  11/01/17 283 lb 8 oz (128.6 kg)  09/30/17 284 lb 3.2 oz (128.9 kg)  08/03/17 281 lb (127.5 kg)       ASSESSMENT AND PLAN:  Chest pain, unspecified type - Plan: EKG 12-Lead Denies any significant shortness of breath on exertion Reports she is looking for to getting back to using her treadmill, doing the boot camp We did some exertion around the office looking for desaturation, Oxygen maintained 96% or greater Young with minimal risk factors, remote smoking history No symptoms concerning for ischemia, normal EKG Recommend she restart her exercise program and call our office if she has any worsening chest pain symptoms. No restrictions at this time  Type 2 diabetes mellitus without complication, without long-term current use of insulin (HCC)We have encouraged continued exercise, careful diet management in an effort to lose weight. Dietary guide provided  Morbidly obese (HCC) Long discussion concerning its of low impact exercise to try Recommended low carbohydrate diet  Obstructive sleep apnea Wearing her CPAP, occasionally comes off in the night  Benign essential HTN Blood pressure is well controlled on today's visit. No changes made to the medications. Potassium stable  Shortness of breath Walked around our office several times with no desaturation Saturation 96% or greater with  exertion. Rarely was less than 90% on sleep study, 0.9% of the time No increased effort on her part, no chest pain or shortness of breath Recommend she restart her exercise program  Disposition:   F/U as needed   Total encounter time more than 60 minutes  Greater than 50% was spent in counseling and coordination of care with the patient    Orders Placed This Encounter  Procedures  . EKG 12-Lead     Signed, Dossie Arbourim Gollan, M.D., Ph.D. 11/01/2017  [facility] [place] [phone]

## 2017-11-15 ENCOUNTER — Ambulatory Visit: Payer: BLUE CROSS/BLUE SHIELD | Admitting: Cardiovascular Disease

## 2017-12-30 ENCOUNTER — Other Ambulatory Visit: Payer: Self-pay | Admitting: Obstetrics and Gynecology

## 2018-01-02 ENCOUNTER — Other Ambulatory Visit: Payer: Self-pay | Admitting: Physician Assistant

## 2018-01-02 DIAGNOSIS — R51 Headache: Principal | ICD-10-CM

## 2018-01-02 DIAGNOSIS — R519 Headache, unspecified: Secondary | ICD-10-CM

## 2018-01-31 ENCOUNTER — Ambulatory Visit (INDEPENDENT_AMBULATORY_CARE_PROVIDER_SITE_OTHER): Payer: BLUE CROSS/BLUE SHIELD | Admitting: Physician Assistant

## 2018-01-31 ENCOUNTER — Encounter: Payer: Self-pay | Admitting: Physician Assistant

## 2018-01-31 VITALS — BP 126/68 | HR 72 | Temp 98.2°F | Resp 16 | Wt 285.0 lb

## 2018-01-31 DIAGNOSIS — N390 Urinary tract infection, site not specified: Secondary | ICD-10-CM

## 2018-01-31 DIAGNOSIS — E119 Type 2 diabetes mellitus without complications: Secondary | ICD-10-CM | POA: Diagnosis not present

## 2018-01-31 LAB — POCT URINALYSIS DIPSTICK
Bilirubin, UA: NEGATIVE
Blood, UA: NEGATIVE
Glucose, UA: NEGATIVE
Ketones, UA: NEGATIVE
Leukocytes, UA: NEGATIVE
Nitrite, UA: NEGATIVE
Protein, UA: NEGATIVE
Spec Grav, UA: 1.01 (ref 1.010–1.025)
Urobilinogen, UA: 0.2 E.U./dL
pH, UA: 6.5 (ref 5.0–8.0)

## 2018-01-31 LAB — POCT GLYCOSYLATED HEMOGLOBIN (HGB A1C)
Est. average glucose Bld gHb Est-mCnc: 140
Hemoglobin A1C: 6.5

## 2018-01-31 MED ORDER — SULFAMETHOXAZOLE-TRIMETHOPRIM 800-160 MG PO TABS: 1.0000 | ORAL_TABLET | Freq: Two times a day (BID) | ORAL | 0 refills | Status: AC

## 2018-01-31 NOTE — Progress Notes (Signed)
Patient: Julia Lynch Female    DOB: 1976-03-23   42 y.o.   MRN: [id number] Visit Date: 02/01/2018  Today's Provider: Trey Sailors, PA-C   Chief Complaint  Patient presents with  . Urinary Tract Infection   Subjective:    Urinary Tract Infection   This is a new problem. The current episode started in the past 7 days (3 days). The problem has been gradually worsening. The quality of the pain is described as burning and aching. There has been no fever (has felt feverish ). Associated symptoms include frequency and urgency. Pertinent negatives include no discharge, hematuria or nausea. She has tried nothing for the symptoms.       Allergies  Allergen Reactions  . Ibuprofen     Other reaction(s): Other (See Comments) GI Upset  . Lodine [Etodolac]      Current Outpatient Medications:  .  buPROPion (WELLBUTRIN XL) 150 MG 24 hr tablet, Take 1 tablet (150 mg total) by mouth daily., Disp: 90 tablet, Rfl: 1 .  cyclobenzaprine (FLEXERIL) 5 MG tablet, Take 1-2 tablets by mouth 2 (two) times daily. Reported on 12/15/2015, Disp: , Rfl:  .  diazepam (VALIUM) 5 MG tablet, AS NEEDED FOR HEADACHES, NO MORE THAN 3 IN A DAY, Disp: 30 tablet, Rfl: 5 .  escitalopram (LEXAPRO) 20 MG tablet, TAKE 1 TABLET BY MOUTH EVERY DAY, Disp: 90 tablet, Rfl: 2 .  hydrochlorothiazide (HYDRODIURIL) 25 MG tablet, Take 1 tablet (25 mg total) by mouth daily., Disp: 90 tablet, Rfl: 3 .  naproxen sodium (ALEVE) 220 MG tablet, Take by mouth. Reported on 12/15/2015, Disp: , Rfl:  .  omeprazole (PRILOSEC) 20 MG capsule, TAKE 1 CAPSULE (20 MG TOTAL) BY MOUTH DAILY., Disp: 90 capsule, Rfl: 4 .  Vitamin D, Ergocalciferol, (DRISDOL) 50000 units CAPS capsule, Take 1 capsule (50,000 Units total) by mouth 2 (two) times a week., Disp: 30 capsule, Rfl: 1 .  sulfamethoxazole-trimethoprim (BACTRIM DS) 800-160 MG tablet, Take 1 tablet by mouth 2 (two) times daily for 3 days., Disp: 6 tablet, Rfl: 0  Review of Systems    Constitutional: Negative.   Gastrointestinal: Negative for nausea.  Genitourinary: Positive for frequency and urgency. Negative for hematuria.    Social History   Tobacco Use  . Smoking status: Former Smoker    Years: 10.00    Last attempt to quit: 10/11/2004    Years since quitting: 13.3  . Smokeless tobacco: Never Used  Substance Use Topics  . Alcohol use: Yes    Alcohol/week: 0.0 oz    Comment: Saturdays   Objective:   BP 126/68 (BP Location: Right Arm, Patient Position: Sitting, Cuff Size: Large)   Pulse 72   Temp 98.2 F (36.8 C)   Resp 16   Wt 285 lb (129.3 kg)   SpO2 96%   BMI 53.85 kg/m  Vitals:   01/31/18 1609  BP: 126/68  Pulse: 72  Resp: 16  Temp: 98.2 F (36.8 C)  SpO2: 96%  Weight: 285 lb (129.3 kg)     Physical Exam  Constitutional: She is oriented to person, place, and time. She appears well-developed and well-nourished. No distress.  Cardiovascular: Normal rate and regular rhythm.  Pulmonary/Chest: Effort normal and breath sounds normal.  Abdominal: Soft. Bowel sounds are normal. She exhibits no distension. There is tenderness in the suprapubic area. There is no rebound, no guarding and no CVA tenderness.  Neurological: She is alert and oriented to person, place, and  time.  Skin: Skin is warm and dry. She is not diaphoretic.  Psychiatric: She has a normal mood and affect. Her behavior is normal.        Assessment & Plan:     1. Urinary tract infection without hematuria, site unspecified  Urine dipstick negative. Instructed that she can wait until the culture but she would rather have an answer now. Treat as below. Complicated do the presence of DM II.  - POCT urinalysis dipstick - sulfamethoxazole-trimethoprim (BACTRIM DS) 800-160 MG tablet; Take 1 tablet by mouth 2 (two) times daily for 3 days.  Dispense: 6 tablet; Refill: 0 - Urine Culture  2. Controlled type 2 diabetes mellitus without complication, without long-term current use of  insulin (HCC)  A1c today 6.5, do not suspect worsening sugar as cause of diabetes. Encouraged to f/u with Antony ContrasJenni for the management of this.  - POCT HgB A1C  Return if symptoms worsen or fail to improve.  The entirety of the information documented in the History of Present Illness, Review of Systems and Physical Exam were personally obtained by me. Portions of this information were initially documented by Anson [place]achelle Presley, CMA and reviewed by me for thoroughness and accuracy.         Trey SailorsAdriana M Saket Hellstrom, PA-C  [facility]

## 2018-01-31 NOTE — Patient Instructions (Signed)

## 2018-02-03 ENCOUNTER — Telehealth: Payer: Self-pay | Admitting: Physician Assistant

## 2018-02-03 ENCOUNTER — Ambulatory Visit (INDEPENDENT_AMBULATORY_CARE_PROVIDER_SITE_OTHER): Payer: BLUE CROSS/BLUE SHIELD | Admitting: Obstetrics and Gynecology

## 2018-02-03 ENCOUNTER — Encounter: Payer: Self-pay | Admitting: Obstetrics and Gynecology

## 2018-02-03 VITALS — BP 130/84 | HR 84 | Ht 61.0 in | Wt 280.0 lb

## 2018-02-03 DIAGNOSIS — B359 Dermatophytosis, unspecified: Secondary | ICD-10-CM

## 2018-02-03 DIAGNOSIS — N9089 Other specified noninflammatory disorders of vulva and perineum: Secondary | ICD-10-CM

## 2018-02-03 DIAGNOSIS — Z30011 Encounter for initial prescription of contraceptive pills: Secondary | ICD-10-CM | POA: Diagnosis not present

## 2018-02-03 LAB — URINE CULTURE

## 2018-02-03 MED ORDER — FLUCONAZOLE 150 MG PO TABS: 150.0000 mg | ORAL_TABLET | Freq: Once | ORAL | 3 refills | Status: AC

## 2018-02-03 MED ORDER — MICONAZOLE NITRATE 2 % EX POWD: CUTANEOUS | 0 refills | Status: DC | PRN

## 2018-02-03 MED ORDER — NORGESTIMATE-ETH ESTRADIOL 0.25-35 MG-MCG PO TABS: 1.0000 | ORAL_TABLET | Freq: Every day | ORAL | 11 refills | Status: DC

## 2018-02-03 NOTE — Telephone Encounter (Signed)
Pt called wanting to know the results of her UA.    She seen Adriana Tuesday.  Pt's call back is [phone]  Thanks teri

## 2018-02-03 NOTE — Progress Notes (Signed)
  Subjective:     Patient ID: Julia Lynch, female   DOB: May 31, 1976, 42 y.o.   MRN: [id number]  HPI  Reports lower pelvic burning and at clitorus and urethra.started 6 days ago.  Increased urinary frequency. Fells like it has with UTIs in past. Had unprotected sex with ex-husband and current boyfriend and asked them both about possible STI exposure.  Also needs BC. Review of Systems Negative except stated above in HPI    Objective:   Physical Exam A&Ox4 Well groomed female Blood pressure 130/84, pulse 84, height 5\' 1"  (1.549 m), weight 280 lb (127 kg), last menstrual period 01/17/2018. Urinalysis    Component Value Date/Time   BILIRUBINUR negative 01/31/2018 1620   PROTEINUR negative 01/31/2018 1620   UROBILINOGEN 0.2 01/31/2018 1620   NITRITE negative 01/31/2018 1620   LEUKOCYTESUR Negative 01/31/2018 1620  tinea rash under panus and in groin folds. Pelvic exam: VULVA: normal appearing vulva with no masses, tenderness or lesions, vulvar erythema , VAGINA: vaginal discharge - white and copious, CERVIX: normal appearing cervix without discharge or lesions, WET MOUNT done - results: clue cells, hyphae, lactobacilli.    Assessment:     Tinea Clitoral irritation Restart BC     Plan:     Diflucan and Lotrisone and sprintec ordered. Instructed on use and to start on 4th day of next menses. RTC as needed.  Lawyer Washabaugh,CNM

## 2018-02-03 NOTE — Telephone Encounter (Signed)
Pt advised. See result note.

## 2018-02-07 NOTE — Progress Notes (Deleted)
       Patient: Julia Lynch Female    DOB: December 19, 1975   42 y.o.   MRN: [id number] Visit Date: 02/07/2018  Today's Provider: Margaretann Loveless, PA-C   No chief complaint on file.  Subjective:    HPI     Allergies  Allergen Reactions  . Ibuprofen     Other reaction(s): Other (See Comments) GI Upset  . Lodine [Etodolac]      Current Outpatient Medications:  .  buPROPion (WELLBUTRIN XL) 150 MG 24 hr tablet, Take 1 tablet (150 mg total) by mouth daily., Disp: 90 tablet, Rfl: 1 .  cyclobenzaprine (FLEXERIL) 5 MG tablet, Take 1-2 tablets by mouth 2 (two) times daily. Reported on 12/15/2015, Disp: , Rfl:  .  diazepam (VALIUM) 5 MG tablet, AS NEEDED FOR HEADACHES, NO MORE THAN 3 IN A DAY, Disp: 30 tablet, Rfl: 5 .  escitalopram (LEXAPRO) 20 MG tablet, TAKE 1 TABLET BY MOUTH EVERY DAY, Disp: 90 tablet, Rfl: 2 .  hydrochlorothiazide (HYDRODIURIL) 25 MG tablet, Take 1 tablet (25 mg total) by mouth daily., Disp: 90 tablet, Rfl: 3 .  miconazole (LOTRIMIN AF) 2 % powder, Apply topically as needed for itching., Disp: 70 g, Rfl: 0 .  naproxen sodium (ALEVE) 220 MG tablet, Take by mouth. Reported on 12/15/2015, Disp: , Rfl:  .  norgestimate-ethinyl estradiol (ORTHO-CYCLEN,SPRINTEC,PREVIFEM) 0.25-35 MG-MCG tablet, Take 1 tablet by mouth daily., Disp: 1 Package, Rfl: 11 .  omeprazole (PRILOSEC) 20 MG capsule, TAKE 1 CAPSULE (20 MG TOTAL) BY MOUTH DAILY., Disp: 90 capsule, Rfl: 4 .  Vitamin D, Ergocalciferol, (DRISDOL) 50000 units CAPS capsule, Take 1 capsule (50,000 Units total) by mouth 2 (two) times a week., Disp: 30 capsule, Rfl: 1  Review of Systems  Social History   Tobacco Use  . Smoking status: Former Smoker    Years: 10.00    Last attempt to quit: 10/11/2004    Years since quitting: 13.3  . Smokeless tobacco: Never Used  Substance Use Topics  . Alcohol use: Yes    Alcohol/week: 0.0 oz    Comment: Saturdays   Objective:   LMP 01/17/2018  There were no vitals filed for this  visit.   Physical Exam      Assessment & Plan:           Margaretann Loveless, PA-C  [facility]

## 2018-02-08 ENCOUNTER — Ambulatory Visit: Payer: BLUE CROSS/BLUE SHIELD | Admitting: Physician Assistant

## 2018-02-08 LAB — NUSWAB VAGINITIS PLUS (VG+)
BVAB 2: HIGH Score — AB
Candida albicans, NAA: NEGATIVE
Candida glabrata, NAA: NEGATIVE
Chlamydia trachomatis, NAA: NEGATIVE
Megasphaera 1: HIGH Score — AB
NEISSERIA GONORRHOEAE, NAA: NEGATIVE
Trich vag by NAA: NEGATIVE

## 2018-02-09 ENCOUNTER — Other Ambulatory Visit: Payer: Self-pay | Admitting: Obstetrics and Gynecology

## 2018-02-09 MED ORDER — CLINDAMYCIN PHOSPHATE (1 DOSE) 2 % VA CREA: 1.0000 | TOPICAL_CREAM | Freq: Once | VAGINAL | 1 refills | Status: AC

## 2018-02-24 ENCOUNTER — Encounter: Payer: Self-pay | Admitting: Physician Assistant

## 2018-02-24 ENCOUNTER — Ambulatory Visit (INDEPENDENT_AMBULATORY_CARE_PROVIDER_SITE_OTHER): Payer: BLUE CROSS/BLUE SHIELD | Admitting: Physician Assistant

## 2018-02-24 VITALS — BP 126/96 | HR 81 | Temp 98.1°F | Resp 16 | Ht 61.0 in | Wt 293.0 lb

## 2018-02-24 DIAGNOSIS — F419 Anxiety disorder, unspecified: Secondary | ICD-10-CM | POA: Diagnosis not present

## 2018-02-24 DIAGNOSIS — Z6841 Body Mass Index (BMI) 40.0 and over, adult: Secondary | ICD-10-CM | POA: Diagnosis not present

## 2018-02-24 DIAGNOSIS — F4321 Adjustment disorder with depressed mood: Secondary | ICD-10-CM | POA: Diagnosis not present

## 2018-02-24 NOTE — Progress Notes (Signed)
Patient: Julia Lynch Female    DOB: [birth year]   42 y.o.   MRN: [id number] Visit Date: 02/24/2018  Today's Provider: Margaretann Loveless, PA-C   Chief Complaint  Patient presents with  . Depression   Subjective:    HPI  Depression, Follow-up  She  was last seen for this 6 months ago. Changes made at last visit include no changes.   She reports excellent compliance with treatment. She is not having side effects.   She reports excellent tolerance of treatment. Current symptoms include: depressed mood and difficulty concentrating She feels she is Worse since last visit.  Having more break through crying spells. Still seeing counselor weekly. Husband went through email and found photos she had sent to a man she was talking to through the divorce care group. He then started trying to apologize and say he wanted her back after he was the one who cheated initially and started the divorce proceedings. However, he also states he cannot say he loves her. So she is feeling very torn and having mixed emotions. Also children are starting to act out more. They also are in counseling but are having more issues with their parents separation.  ------------------------------------------------------------------------     Allergies  Allergen Reactions  . Ibuprofen     Other reaction(s): Other (See Comments) GI Upset  . Lodine [Etodolac]      Current Outpatient Medications:  .  buPROPion (WELLBUTRIN XL) 150 MG 24 hr tablet, Take 1 tablet (150 mg total) by mouth daily., Disp: 90 tablet, Rfl: 1 .  cyclobenzaprine (FLEXERIL) 5 MG tablet, Take 1-2 tablets by mouth 2 (two) times daily. Reported on 12/15/2015, Disp: , Rfl:  .  diazepam (VALIUM) 5 MG tablet, AS NEEDED FOR HEADACHES, NO MORE THAN 3 IN A DAY, Disp: 30 tablet, Rfl: 5 .  escitalopram (LEXAPRO) 20 MG tablet, TAKE 1 TABLET BY MOUTH EVERY DAY, Disp: 90 tablet, Rfl: 2 .  hydrochlorothiazide (HYDRODIURIL) 25 MG tablet, Take 1  tablet (25 mg total) by mouth daily., Disp: 90 tablet, Rfl: 3 .  miconazole (LOTRIMIN AF) 2 % powder, Apply topically as needed for itching., Disp: 70 g, Rfl: 0 .  naproxen sodium (ALEVE) 220 MG tablet, Take by mouth. Reported on 12/15/2015, Disp: , Rfl:  .  norgestimate-ethinyl estradiol (ORTHO-CYCLEN,SPRINTEC,PREVIFEM) 0.25-35 MG-MCG tablet, Take 1 tablet by mouth daily., Disp: 1 Package, Rfl: 11 .  omeprazole (PRILOSEC) 20 MG capsule, TAKE 1 CAPSULE (20 MG TOTAL) BY MOUTH DAILY., Disp: 90 capsule, Rfl: 4 .  Vitamin D, Ergocalciferol, (DRISDOL) 50000 units CAPS capsule, Take 1 capsule (50,000 Units total) by mouth 2 (two) times a week., Disp: 30 capsule, Rfl: 1  Review of Systems  Constitutional: Negative.   Respiratory: Negative.   Cardiovascular: Negative.   Neurological: Negative.   Psychiatric/Behavioral: Positive for agitation, dysphoric mood and sleep disturbance. The patient is nervous/anxious.     Social History   Tobacco Use  . Smoking status: Former Smoker    Years: 10.00    Last attempt to quit: 10/11/2004    Years since quitting: 13.3  . Smokeless tobacco: Never Used  Substance Use Topics  . Alcohol use: Yes    Alcohol/week: 0.0 oz    Comment: Saturdays   Objective:   BP (!) 126/96 (BP Location: Left Arm, Patient Position: Sitting, Cuff Size: Large)   Pulse 81   Temp 98.1 F (36.7 C) (Oral)   Resp 16   Ht  (1.549  m)   Wt 293 lb (132.9 kg)   SpO2 98%   BMI 55.36 kg/m  Vitals:   02/24/18 1330  BP: (!) 126/96  Pulse: 81  Resp: 16  Temp: 98.1 F (36.7 C)  TempSrc: Oral  SpO2: 98%  Weight: 293 lb (132.9 kg)  Height:  (1.549 m)     Physical Exam  Constitutional: She appears well-developed and well-nourished. No distress.  Neck: Normal range of motion. Neck supple.  Cardiovascular: Normal rate, regular rhythm and normal heart sounds. Exam reveals no gallop and no friction rub.  No murmur heard. Pulmonary/Chest: Effort normal and breath sounds  normal. No respiratory distress. She has no wheezes. She has no rales.  Skin: She is not diaphoretic.  Psychiatric: Her speech is normal and behavior is normal. Judgment normal. Her mood appears anxious. Cognition and memory are normal. She exhibits a depressed mood. She expresses no homicidal and no suicidal ideation.  Vitals reviewed.      Assessment & Plan:     1. Situational depression Worsening. Continue counseling and lexapro . Will increase wellbutrin to  XR. She is to call if this is working and I will send in a new Rx. If not working will back down and change lexapro. I will see her back in 4 weeks to discuss.   2. Morbid obesity (HCC) Patient has been working on decreasing portion size as well as exercising regularly. She will lose weight then gain right back. She is interested in being considered for bariatric surgery. Referral placed to [facility] for consideration of bariatric surgery.  - Ambulatory referral to General Surgery  3. BMI 50.0-59.9, adult (HCC) Counseled patient on healthy lifestyle modifications including dieting and exercise.  - Ambulatory referral to General Surgery       Margaretann Loveless, PA-C  [facility]

## 2018-02-27 ENCOUNTER — Encounter: Payer: Self-pay | Admitting: Physician Assistant

## 2018-02-27 MED ORDER — BUPROPION HCL ER (XL) 150 MG PO TB24: 300.0000 mg | ORAL_TABLET | Freq: Every day | ORAL | 1 refills | Status: DC

## 2018-03-08 ENCOUNTER — Encounter: Payer: Self-pay | Admitting: Physician Assistant

## 2018-03-08 ENCOUNTER — Other Ambulatory Visit: Payer: Self-pay | Admitting: Physician Assistant

## 2018-03-08 DIAGNOSIS — F321 Major depressive disorder, single episode, moderate: Secondary | ICD-10-CM

## 2018-03-08 DIAGNOSIS — F419 Anxiety disorder, unspecified: Secondary | ICD-10-CM

## 2018-03-08 DIAGNOSIS — F4321 Adjustment disorder with depressed mood: Secondary | ICD-10-CM

## 2018-03-09 MED ORDER — CITALOPRAM HYDROBROMIDE 40 MG PO TABS: 40.0000 mg | ORAL_TABLET | Freq: Every day | ORAL | 3 refills | Status: DC

## 2018-03-24 ENCOUNTER — Encounter: Payer: Self-pay | Admitting: Certified Nurse Midwife

## 2018-03-24 ENCOUNTER — Other Ambulatory Visit: Payer: Self-pay

## 2018-03-24 ENCOUNTER — Ambulatory Visit (INDEPENDENT_AMBULATORY_CARE_PROVIDER_SITE_OTHER): Payer: BLUE CROSS/BLUE SHIELD | Admitting: Certified Nurse Midwife

## 2018-03-24 VITALS — BP 128/75 | HR 85 | Ht 61.0 in | Wt 290.6 lb

## 2018-03-24 DIAGNOSIS — Z202 Contact with and (suspected) exposure to infections with a predominantly sexual mode of transmission: Secondary | ICD-10-CM | POA: Diagnosis not present

## 2018-03-24 LAB — POCT URINE PREGNANCY: Preg Test, Ur: NEGATIVE

## 2018-03-24 NOTE — Progress Notes (Signed)
Pt is present today to have STD, pregnancy and HIV test.

## 2018-03-24 NOTE — Progress Notes (Signed)
GYN ENCOUNTER NOTE  Subjective:       Julia Lynch is a 42 y.o. G10P0 female here for gynecologic evaluation of the following issues:  1. Possible STI exposure  Patient fears she was exposed to STIs after encounter last week. Desires STI testing.   Denies difficulty breathing or respiratory distress, chest pain, abdominal pain, excessive vaginal bleeding, dysuria, and leg pain or swelling.    Gynecologic History   Patient's last menstrual period was 03/19/2018.  Contraception: OCP (estrogen/progesterone)  Last Pap: 07/2017. Results were: ASC-US/HPV negative  Last mammogram: 08/2017. Results were: BI-RADS 1  Obstetric History  OB History  Gravida Para Term Preterm AB Living  3         3  SAB TAB Ectopic Multiple Live Births          3    # Outcome Date GA Lbr Len/2nd Weight Sex Delivery Anes PTL Lv  3 Gravida 2013    M Vag-Spont   LIV  2 Gravida 2012    M Vag-Spont   LIV  1 Gravida 2007    M Vag-Spont   LIV    Past Medical History:  Diagnosis Date  . Anxiety   . Arthritis   . Depression   . Diabetic acidosis, type II (HCC)   . GERD (gastroesophageal reflux disease)   . History of chicken pox   . PCOS (polycystic ovarian syndrome)   . Vitamin D deficiency disease     Past Surgical History:  Procedure Laterality Date  . left ankle repair  1995    Current Outpatient Medications on File Prior to Visit  Medication Sig Dispense Refill  . citalopram (CELEXA) 40 MG tablet Take 1 tablet (40 mg total) by mouth daily. 30 tablet 3  . cyclobenzaprine (FLEXERIL) 5 MG tablet Take 1-2 tablets by mouth 2 (two) times daily. Reported on 12/15/2015    . diazepam (VALIUM) 5 MG tablet AS NEEDED FOR HEADACHES, NO MORE THAN 3 IN A DAY 30 tablet 5  . hydrochlorothiazide (HYDRODIURIL) 25 MG tablet Take 1 tablet (25 mg total) by mouth daily. 90 tablet 3  . miconazole (LOTRIMIN AF) 2 % powder Apply topically as needed for itching. 70 g 0  . naproxen sodium (ALEVE) 220 MG tablet Take  by mouth. Reported on 12/15/2015    . norgestimate-ethinyl estradiol (ORTHO-CYCLEN,SPRINTEC,PREVIFEM) 0.25-35 MG-MCG tablet Take 1 tablet by mouth daily. 1 Package 11  . omeprazole (PRILOSEC) 20 MG capsule TAKE 1 CAPSULE (20 MG TOTAL) BY MOUTH DAILY. 90 capsule 4  . Vitamin D, Ergocalciferol, (DRISDOL) 50000 units CAPS capsule Take 1 capsule (50,000 Units total) by mouth 2 (two) times a week. 30 capsule 1  . buPROPion (WELLBUTRIN XL) 150 MG 24 hr tablet TAKE 1 TABLET BY MOUTH EVERY DAY (Patient not taking: Reported on 03/24/2018) 90 tablet 1  . escitalopram (LEXAPRO) 20 MG tablet TAKE 1 TABLET BY MOUTH EVERY DAY (Patient not taking: Reported on 03/24/2018) 90 tablet 2   No current facility-administered medications on file prior to visit.     Allergies  Allergen Reactions  . Ibuprofen     Other reaction(s): Other (See Comments) GI Upset  . Lodine [Etodolac]     Social History   Socioeconomic History  . Marital status: Divorved    Spouse name: Not on file  . Number of children: 3  . Years of education: Not on file  . Highest education level: Not on file  Occupational History  . Not on file  Social Needs  . Financial resource strain: Not on file  . Food insecurity:    Worry: Not on file    Inability: Not on file  . Transportation needs:    Medical: Not on file    Non-medical: Not on file  Tobacco Use  . Smoking status: Former Smoker    Years: 10.00    Last attempt to quit: 10/11/2004    Years since quitting: 13.4  . Smokeless tobacco: Never Used  Substance and Sexual Activity  . Alcohol use: Yes    Alcohol/week: 0.0 oz    Comment: Saturdays  . Drug use: No  . Sexual activity: Yes      Lifestyle  . Physical activity:    Days per week: Not on file    Minutes per session: Not on file  . Stress: Not on file  Relationships  . Social connections:    Talks on phone: Not on file    Gets together: Not on file    Attends religious service: Not on file    Active member of club  or organization: Not on file    Attends meetings of clubs or organizations: Not on file    Relationship status: Not on file  . Intimate partner violence:    Fear of current or ex partner: Not on file    Emotionally abused: Not on file    Physically abused: Not on file    Forced sexual activity: Not on file  Other Topics Concern  . Not on file  Social History Narrative  . Not on file    Family History  Problem Relation Age of Onset  . Diabetes Father   . Hypertension Father   . Hepatitis C Mother   . Heart attack Maternal Grandfather   . Hypertension Paternal Grandmother   . Diabetes Paternal Grandmother     The following portions of the patient's history were reviewed and updated as appropriate: allergies, current medications, past family history, past medical history, past social history, past surgical history and problem list.  Review of Systems  ROS negative except as noted above. Information obtained from patient.   Objective:   BP 128/75   Pulse 85   Ht 5\' 1"  (1.549 m)   Wt 290 lb 9.6 oz (131.8 kg)   LMP 03/19/2018   BMI 54.91 kg/m   GENERAL: Alert and oriented x 4, no apparent distress.   Labs: UPT negative.   Physical exam: not indicated.   Assessment:   1. Possible exposure to STD  - HSV(herpes simplex vrs) 1+2 ab-IgG - RPR - HIV antibody - Hepatitis C antibody - Hepatitis B surface antigen - HSV 1 AND 2 IGM ABS, INDIRECT - GC/Chlamydia Probe Amp([facility]) - POCT urine pregnancy  Plan:   Discussed timeline of exposure to positive test results, patient verbalized understanding.   Will collect labs today and repeat in 3-6 months as needed.   Education regarding safe sex; see AVS.   Reviewed red flag symptoms and when to call.   RTC x 3-6 months for follow up or sooner if needed.    Gunnar BullaJenkins Michelle Favian Kittleson, CNM [facility], [facility]

## 2018-03-24 NOTE — Patient Instructions (Signed)
Sexually Transmitted Disease  A sexually transmitted disease (STD) is a disease or infection that may be passed (transmitted) from person to person, usually during sexual activity. This may happen by way of saliva, semen, blood, vaginal mucus, or urine. Common STDs include:   Gonorrhea.   Chlamydia.   Syphilis.   HIV and AIDS.   Genital herpes.   Hepatitis B and C.   Trichomonas.   Human papillomavirus (HPV).   Pubic lice.   Scabies.   Mites.   Bacterial vaginosis.    What are the causes?  An STD may be caused by bacteria, a virus, or parasites. STDs are often transmitted during sexual activity if one person is infected. However, they may also be transmitted through nonsexual means. STDs may be transmitted after:   Sexual intercourse with an infected person.   Sharing sex toys with an infected person.   Sharing needles with an infected person or using unclean piercing or tattoo needles.   Having intimate contact with the genitals, mouth, or rectal areas of an infected person.   Exposure to infected fluids during birth.    What are the signs or symptoms?  Different STDs have different symptoms. Some people may not have any symptoms. If symptoms are present, they may include:   Painful or bloody urination.   Pain in the pelvis, abdomen, vagina, anus, throat, or eyes.   A skin rash, itching, or irritation.   Growths, ulcerations, blisters, or sores in the genital and anal areas.   Abnormal vaginal discharge with or without bad odor.   Penile discharge in men.   Fever.   Pain or bleeding during sexual intercourse.   Swollen glands in the groin area.   Yellow skin and eyes (jaundice). This is seen with hepatitis.   Swollen testicles.   Infertility.   Sores and blisters in the mouth.    How is this diagnosed?  To make a diagnosis, your health care provider may:   Take a medical history.   Perform a physical exam.   Take a sample of any discharge to examine.   Swab the throat, cervix,  opening to the penis, rectum, or vagina for testing.   Test a sample of your first morning urine.   Perform blood tests.   Perform a Pap test, if this applies.   Perform a colposcopy.   Perform a laparoscopy.    How is this treated?  Treatment depends on the STD. Some STDs may be treated but not cured.   Chlamydia, gonorrhea, trichomonas, and syphilis can be cured with antibiotic medicine.   Genital herpes, hepatitis, and HIV can be treated, but not cured, with prescribed medicines. The medicines lessen symptoms.   Genital warts from HPV can be treated with medicine or by freezing, burning (electrocautery), or surgery. Warts may come back.   HPV cannot be cured with medicine or surgery. However, abnormal areas may be removed from the cervix, vagina, or vulva.   If your diagnosis is confirmed, your recent sexual partners need treatment. This is true even if they are symptom-free or have a negative culture or evaluation. They should not have sex until their health care providers say it is okay.   Your health care provider may test you for infection again 3 months after treatment.    How is this prevented?  Take these steps to reduce your risk of getting an STD:   Use latex condoms, dental dams, and water-soluble lubricants during sexual activity. Do not use   petroleum jelly or oils.   Avoid having multiple sex partners.   Do not have sex with someone who has other sex partners.   Do not have sex with anyone you do not know or who is at high risk for an STD.   Avoid risky sex practices that can break your skin.   Do not have sex if you have open sores on your mouth or skin.   Avoid drinking too much alcohol or taking illegal drugs. Alcohol and drugs can affect your judgment and put you in a vulnerable position.   Avoid engaging in oral and anal sex acts.   Get vaccinated for HPV and hepatitis. If you have not received these vaccines in the past, talk to your health care provider about whether one or  both might be right for you.   If you are at risk of being infected with HIV, it is recommended that you take a prescription medicine daily to prevent HIV infection. This is called pre-exposure prophylaxis (PrEP). You are considered at risk if:  ? You are a man who has sex with other men (MSM).  ? You are a heterosexual man or woman and are sexually active with more than one partner.  ? You take drugs by injection.  ? You are sexually active with a partner who has HIV.   Talk with your health care provider about whether you are at high risk of being infected with HIV. If you choose to begin PrEP, you should first be tested for HIV. You should then be tested every 3 months for as long as you are taking PrEP.    Contact a health care provider if:   See your health care provider.   Tell your sexual partner(s). They should be tested and treated for any STDs.   Do not have sex until your health care provider says it is okay.  Get help right away if:  Contact your health care provider right away if:   You have severe abdominal pain.   You are a man and notice swelling or pain in your testicles.   You are a woman and notice swelling or pain in your vagina.    This information is not intended to replace advice given to you by your health care provider. Make sure you discuss any questions you have with your health care provider.  Document Released: 12/18/2002 Document Revised: 04/16/2016 Document Reviewed: 04/17/2013  Elsevier Interactive Patient Education  2018 Elsevier Inc[employer]

## 2018-03-24 NOTE — Telephone Encounter (Signed)
Pt was called and informed that her pregnancy test was negative.

## 2018-03-29 LAB — HIV ANTIBODY (ROUTINE TESTING W REFLEX): HIV Screen 4th Generation wRfx: NONREACTIVE

## 2018-03-29 LAB — HSV-2 IGG SUPPLEMENTAL TEST: HSV-2 IGG SUPPLEMENTAL TEST: POSITIVE — AB

## 2018-03-29 LAB — HSV(HERPES SIMPLEX VRS) I + II AB-IGG: HSV 2 IgG, Type Spec: 2.66 index — ABNORMAL HIGH (ref 0.00–0.90)

## 2018-03-29 LAB — RPR: RPR Ser Ql: NONREACTIVE

## 2018-03-29 LAB — HSV 1 AND 2 IGM ABS, INDIRECT: HSV 1 IgM: <1:10 {titer}

## 2018-03-29 LAB — HEPATITIS C ANTIBODY

## 2018-03-29 LAB — HEPATITIS B SURFACE ANTIGEN: HEP B S AG: NEGATIVE

## 2018-04-01 ENCOUNTER — Other Ambulatory Visit: Payer: Self-pay | Admitting: Physician Assistant

## 2018-04-01 DIAGNOSIS — F419 Anxiety disorder, unspecified: Secondary | ICD-10-CM

## 2018-04-01 DIAGNOSIS — F4321 Adjustment disorder with depressed mood: Secondary | ICD-10-CM

## 2018-04-05 ENCOUNTER — Encounter: Payer: Self-pay | Admitting: Certified Nurse Midwife

## 2018-04-06 ENCOUNTER — Ambulatory Visit (INDEPENDENT_AMBULATORY_CARE_PROVIDER_SITE_OTHER): Payer: BLUE CROSS/BLUE SHIELD | Admitting: Physician Assistant

## 2018-04-06 ENCOUNTER — Encounter: Payer: Self-pay | Admitting: Physician Assistant

## 2018-04-06 VITALS — BP 136/80 | HR 76 | Temp 98.3°F | Resp 16 | Wt 286.0 lb

## 2018-04-06 DIAGNOSIS — Z113 Encounter for screening for infections with a predominantly sexual mode of transmission: Secondary | ICD-10-CM | POA: Diagnosis not present

## 2018-04-06 NOTE — Progress Notes (Signed)
Patient: Julia Lynch Female    DOB: August 30, 1976   42 y.o.   MRN: [id number] Visit Date: 04/06/2018  Today's Provider: Margaretann LovelessJennifer M Burnette, PA-C   Chief Complaint  Patient presents with  . Vaginal Discharge   Subjective:    HPI Patient here today C/O possible yeast infection. Patient reports that she had her period on 03/24/18 and ended the 04/03/18. Patient reports that she has taken a dose of Diflucan Monday night. Patient reports that she is still having some vaginal itching. Patient reports she has some lab test done by GYN but no vaginal exam on 03/24/18. Patient reports vaginal discharge has some blood when she wipes.  She went to GYN on 03/24/18 and requested STD testing. Patient was having vaginal itching and irritation. She reports she has slept with 4 guys unprotected recently. She was tested for RPR, HIV and HSV. 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 She has had no lesions or outbreaks. HIV and RPR were negative.   She is requesting vaginal testing and exam today (not done by GYN due to getting ready to start menstrual cycle). Still having vaginal itching and burning despite 5 days of vaginal cream for yeast and 1 diflucan.     Allergies  Allergen Reactions  . Ibuprofen     Other reaction(s): Other (See Comments) GI Upset  . Lodine [Etodolac]      Current Outpatient Medications:  .  citalopram (CELEXA) 40 MG tablet, Take 1 tablet (40 mg total) by mouth daily., Disp: 30 tablet, Rfl: 3 .  diazepam (VALIUM) 5 MG tablet, AS NEEDED FOR HEADACHES, NO MORE THAN 3 IN A DAY, Disp: 30 tablet, Rfl: 5 .  hydrochlorothiazide (HYDRODIURIL) 25 MG tablet, Take 1 tablet (25 mg total) by mouth daily., Disp: 90 tablet, Rfl: 3 .  naproxen sodium (ALEVE) 220 MG tablet, Take by mouth. Reported on 12/15/2015, Disp: , Rfl:  .  omeprazole (PRILOSEC) 20 MG capsule, TAKE 1 CAPSULE (20 MG TOTAL) BY MOUTH DAILY., Disp: 90 capsule, Rfl: 4 .  Vitamin D, Ergocalciferol, (DRISDOL) 50000 units CAPS capsule,  Take 1 capsule (50,000 Units total) by mouth 2 (two) times a week., Disp: 30 capsule, Rfl: 1  Review of Systems  Constitutional: Negative.   Respiratory: Negative.   Cardiovascular: Negative.   Genitourinary: Positive for vaginal discharge.    Social History   Tobacco Use  . Smoking status: Former Smoker    Years: 10.00    Last attempt to quit: 10/11/2004    Years since quitting: 13.4  . Smokeless tobacco: Never Used  Substance Use Topics  . Alcohol use: Yes    Alcohol/week: 0.0 oz    Comment: Saturdays   Objective:   BP 136/80 (BP Location: Left Arm, Patient Position: Sitting, Cuff Size: Large)   Pulse 76   Temp 98.3 F (36.8 C) (Oral)   Resp 16   Wt 286 lb (129.7 kg)   LMP 03/24/2018   SpO2 96%   BMI 54.04 kg/m  Vitals:   04/06/18 1356  BP: 136/80  Pulse: 76  Resp: 16  Temp: 98.3 F (36.8 C)  TempSrc: Oral  SpO2: 96%  Weight: 286 lb (129.7 kg)     Physical Exam  Constitutional: She appears well-developed and well-nourished. No distress.  Neck: Normal range of motion. Neck supple.  Cardiovascular: Normal rate, regular rhythm and normal heart sounds. Exam reveals no gallop and no friction rub.  No murmur heard. Pulmonary/Chest: Effort normal and breath sounds normal. No  respiratory distress. She has no wheezes. She has no rales.  Genitourinary: Rectum normal. There is no rash, tenderness, lesion or injury on the right labia. There is no rash, tenderness, lesion or injury on the left labia. Right adnexum displays no mass, no tenderness and no fullness. Left adnexum displays no mass, no tenderness and no fullness. There is erythema and tenderness in the vagina. Vaginal discharge found.  Skin: She is not diaphoretic.  Vitals reviewed.    Assessment & Plan:     1. Screen for STD (sexually transmitted disease) NuSwab collected today to evaluate for other STD causes of irritation. Suspect yeast vaginitis due to appearance. She has diflucan at home. She is to take  another tab today. I will f/u pending results and treat as necessary.  - NuSwab Vaginitis Plus (VG+)       Margaretann Loveless, PA-C  [facility]

## 2018-04-07 ENCOUNTER — Encounter: Payer: BLUE CROSS/BLUE SHIELD | Admitting: Certified Nurse Midwife

## 2018-04-11 LAB — NUSWAB VAGINITIS PLUS (VG+)
CANDIDA ALBICANS, NAA: NEGATIVE
CANDIDA GLABRATA, NAA: NEGATIVE
Chlamydia trachomatis, NAA: NEGATIVE
Neisseria gonorrhoeae, NAA: NEGATIVE
Trich vag by NAA: NEGATIVE

## 2018-04-12 ENCOUNTER — Encounter: Payer: Self-pay | Admitting: Certified Nurse Midwife

## 2018-04-12 ENCOUNTER — Telehealth: Payer: Self-pay

## 2018-04-12 NOTE — Telephone Encounter (Signed)
-----   Message from Margaretann LovelessJennifer M Burnette, [place]PA-C sent at 04/12/2018  8:46 AM EDT ----- Julia RenNuswab was completely negative for everything

## 2018-04-12 NOTE — Telephone Encounter (Signed)
Patient advised as below.  

## 2018-04-17 NOTE — Telephone Encounter (Signed)
Please contact patient. May schedule lab appointment for Wednesday or Thursday to collect HSV1-2 IgG and IgM in preparation for visit on Friday. Thanks, JML

## 2018-04-21 ENCOUNTER — Encounter: Payer: BLUE CROSS/BLUE SHIELD | Admitting: Certified Nurse Midwife

## 2018-04-21 ENCOUNTER — Other Ambulatory Visit: Payer: BLUE CROSS/BLUE SHIELD

## 2018-04-21 DIAGNOSIS — Z202 Contact with and (suspected) exposure to infections with a predominantly sexual mode of transmission: Secondary | ICD-10-CM

## 2018-04-25 LAB — HSV(HERPES SIMPLEX VRS) I + II AB-IGG
HSV 1 Glycoprotein G Ab, IgG: <0.91 index (ref 0.00–0.90)
HSV 2 IgG, Type Spec: 2.86 index — ABNORMAL HIGH (ref 0.00–0.90)

## 2018-04-25 LAB — HSV-2 IGG SUPPLEMENTAL TEST: HSV-2 IgG Supplemental Test: POSITIVE — AB

## 2018-04-25 LAB — HSV(HERPES SIMPLEX VRS) I + II AB-IGM: HSVI/II Comb IgM: 1.12 Ratio — ABNORMAL HIGH (ref 0.00–0.90)

## 2018-04-26 ENCOUNTER — Encounter: Payer: Self-pay | Admitting: Certified Nurse Midwife

## 2018-05-01 ENCOUNTER — Encounter: Payer: Self-pay | Admitting: Certified Nurse Midwife

## 2018-05-01 ENCOUNTER — Ambulatory Visit (INDEPENDENT_AMBULATORY_CARE_PROVIDER_SITE_OTHER): Payer: BLUE CROSS/BLUE SHIELD | Admitting: Certified Nurse Midwife

## 2018-05-01 VITALS — BP 127/81 | HR 92 | Ht 61.0 in | Wt 292.1 lb

## 2018-05-01 DIAGNOSIS — B009 Herpesviral infection, unspecified: Secondary | ICD-10-CM | POA: Diagnosis not present

## 2018-05-01 MED ORDER — VALACYCLOVIR HCL 500 MG PO TABS: 500.0000 mg | ORAL_TABLET | Freq: Every day | ORAL | 12 refills | Status: DC

## 2018-05-01 NOTE — Patient Instructions (Signed)
Valacyclovir caplets What is this medicine? VALACYCLOVIR (val ay SYE kloe veer) is an antiviral medicine. It is used to treat or prevent infections caused by certain kinds of viruses. Examples of these infections include herpes and shingles. This medicine will not cure herpes. This medicine may be used for other purposes; ask your health care provider or pharmacist if you have questions. COMMON BRAND NAME(S): Valtrex What should I tell my health care provider before I take this medicine? They need to know if you have any of these conditions: -acquired immunodeficiency syndrome (AIDS) -any other condition that may weaken the immune system -bone marrow or kidney transplant -kidney disease -an unusual or allergic reaction to valacyclovir, acyclovir, ganciclovir, valganciclovir, other medicines, foods, dyes, or preservatives -pregnant or trying to get pregnant -breast-feeding How should I use this medicine? Take this medicine by mouth with a glass of water. Follow the directions on the prescription label. You can take this medicine with or without food. Take your doses at regular intervals. Do not take your medicine more often than directed. Finish the full course prescribed by your doctor or health care professional even if you think your condition is better. Do not stop taking except on the advice of your doctor or health care professional. Talk to your pediatrician regarding the use of this medicine in children. While this drug may be prescribed for children as young as 2 years for selected conditions, precautions do apply. Overdosage: If you think you have taken too much of this medicine contact a poison control center or emergency room at once. NOTE: This medicine is only for you. Do not share this medicine with others. What if I miss a dose? If you miss a dose, take it as soon as you can. If it is almost time for your next dose, take only that dose. Do not take double or extra doses. What may  interact with this medicine? -cimetidine -probenecid This list may not describe all possible interactions. Give your health care provider a list of all the medicines, herbs, non-prescription drugs, or dietary supplements you use. Also tell them if you smoke, drink alcohol, or use illegal drugs. Some items may interact with your medicine. What should I watch for while using this medicine? Tell your doctor or health care professional if your symptoms do not start to get better after 1 week. This medicine works best when taken early in the course of an infection, within the first 72 hours. Begin treatment as soon as possible after the first signs of infection like tingling, itching, or pain in the affected area. It is possible that genital herpes may still be spread even when you are not having symptoms. Always use safer sex practices like condoms made of latex or polyurethane whenever you have sexual contact. You should stay well hydrated while taking this medicine. Drink plenty of fluids. What side effects may I notice from receiving this medicine? Side effects that you should report to your doctor or health care professional as soon as possible: -allergic reactions like skin rash, itching or hives, swelling of the face, lips, or tongue -aggressive behavior -confusion -hallucinations -problems with balance, talking, walking -stomach pain -tremor -trouble passing urine or change in the amount of urine Side effects that usually do not require medical attention (report to your doctor or health care professional if they continue or are bothersome): -dizziness -headache -nausea, vomiting This list may not describe all possible side effects. Call your doctor for medical advice about side effects. You   may report side effects to FDA at 1-800-FDA-1088. Where should I keep my medicine? Keep out of the reach of children. Store at room temperature between 15 and 25 degrees C (59 and 77 degrees F). Keep  container tightly closed. Throw away any unused medicine after the expiration date. NOTE: This sheet is a summary. It may not cover all possible information. If you have questions about this medicine, talk to your doctor, pharmacist, or health care provider.  2018 Elsevier/Gold Standard (2012-09-12 16:34:05)  

## 2018-05-02 NOTE — Progress Notes (Signed)
GYN ENCOUNTER NOTE  Subjective:       Julia Lynch is a 42 y.o. 643P0 female here to review lab results and discuss treatment options.   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 Currently, "working on marriage" and spouse is negative for HSV-2. She has several questions regarding outbreaks, treatment and suppression options.   Denies difficulty breathing or respiratory distress, chest pain, abdominal pain, excessive vaginal bleeding, dysuria, and leg pain or swelling.    Gynecologic History  Patient's last menstrual period was 04/30/2018.  Last Pap: 07/2017. Results were: AS-CUS/HPV negative  Last mammogram: 08/2017. Results were: BI-RADS 1  Obstetric History  OB History  Gravida Para Term Preterm AB Living  3         3  SAB TAB Ectopic Multiple Live Births          3    # Outcome Date GA Lbr Len/2nd Weight Sex Delivery Anes PTL Lv  3 Gravida 2013    M Vag-Spont   LIV  2 Gravida 2012    M Vag-Spont   LIV  1 Gravida 2007    M Vag-Spont   LIV    Past Medical History:  Diagnosis Date  . Anxiety   . Arthritis   . Depression   . Diabetic acidosis, type II (HCC)   . GERD (gastroesophageal reflux disease)   . History of chicken pox   . PCOS (polycystic ovarian syndrome)   . Vitamin D deficiency disease     Past Surgical History:  Procedure Laterality Date  . left ankle repair  1995    Current Outpatient Medications on File Prior to Visit  Medication Sig Dispense Refill  . citalopram (CELEXA) 40 MG tablet Take 1 tablet (40 mg total) by mouth daily. 30 tablet 3  . diazepam (VALIUM) 5 MG tablet AS NEEDED FOR HEADACHES, NO MORE THAN 3 IN A DAY 30 tablet 5  . hydrochlorothiazide (HYDRODIURIL) 25 MG tablet Take 1 tablet (25 mg total) by mouth daily. 90 tablet 3  . naproxen sodium (ALEVE) 220 MG tablet Take by mouth. Reported on 12/15/2015    . omeprazole (PRILOSEC) 20 MG capsule TAKE 1 CAPSULE (20 MG TOTAL) BY MOUTH DAILY. 90 capsule 4  .  Vitamin D, Ergocalciferol, (DRISDOL) 50000 units CAPS capsule Take 1 capsule (50,000 Units total) by mouth 2 (two) times a week. 30 capsule 1   No current facility-administered medications on file prior to visit.     Allergies  Allergen Reactions  . Ibuprofen     Other reaction(s): Other (See Comments) GI Upset  . Lodine [Etodolac]     Social History   Socioeconomic History  . Marital status: Married    Spouse name: Not on file  . Number of children: 3  . Years of education: Not on file  . Highest education level: Not on file  Occupational History  . Not on file  Social Needs  . Financial resource strain: Not on file  . Food insecurity:    Worry: Not on file    Inability: Not on file  . Transportation needs:    Medical: Not on file    Non-medical: Not on file  Tobacco Use  . Smoking status: Former Smoker    Years: 10.00    Last attempt to quit: 10/11/2004    Years since quitting: 13.5  . Smokeless tobacco: Never Used  Substance and Sexual Activity  . Alcohol use: Yes    Alcohol/week: 0.0  oz    Comment: Saturdays  . Drug use: No  . Sexual activity: Yes    Comment: husband-vasectomy  Lifestyle  . Physical activity:    Days per week: Not on file    Minutes per session: Not on file  . Stress: Not on file  Relationships  . Social connections:    Talks on phone: Not on file    Gets together: Not on file    Attends religious service: Not on file    Active member of club or organization: Not on file    Attends meetings of clubs or organizations: Not on file    Relationship status: Not on file  . Intimate partner violence:    Fear of current or ex partner: Not on file    Emotionally abused: Not on file    Physically abused: Not on file    Forced sexual activity: Not on file  Other Topics Concern  . Not on file  Social History Narrative  . Not on file    Family History  Problem Relation Age of Onset  . Diabetes Father   . Hypertension Father   . Hepatitis C  Mother   . Heart attack Maternal Grandfather   . Hypertension Paternal Grandmother   . Diabetes Paternal Grandmother     The following portions of the patient's history were reviewed and updated as appropriate: allergies, current medications, past family history, past medical history, past social history, past surgical history and problem list.  Review of Systems  ROS negative except as noted above. Information obtained from patient.   Objective:   BP 127/81   Pulse 92   Ht 5\' 1"  (1.549 m)   Wt 292 lb 1.6 oz (132.5 kg)   LMP 04/30/2018   BMI 55.19 kg/m   GENERAL: Alert and oriented x 4, no apparent distress.   Labs:  HSV(herpes simplex vrs) 1+2 ab-IgG     Status: Abnormal   Collection Time: 03/24/18 11:38 AM  Result Value Ref Range   HSV 1 Glycoprotein G Ab, IgG <0.91 0.00 - 0.90 index    Comment:                                  Negative        <0.91                                  Equivocal 0.91 - 1.09                                  Positive        >1.09  Note: Negative indicates no antibodies detected to  HSV-1. Equivocal may suggest early infection.  If  clinically appropriate, retest at later date. Positive  indicates antibodies detected to HSV-1.    HSV 2 IgG, Type Spec 2.66 (H) 0.00 - 0.90 index    Comment:                                  Negative        <0.91  Equivocal 0.91 - 1.09                                  Positive        >1.09  Note: Negative indicates no antibodies detected to  HSV-2. Equivocal may suggest early infection.  If  clinically appropriate, retest at later date. Positive  indicates antibodies detected to HSV-2.   RPR     Status: None   Collection Time: 03/24/18 11:38 AM  Result Value Ref Range   RPR Ser Ql Non Reactive Non Reactive  HIV antibody     Status: None   Collection Time: 03/24/18 11:38 AM  Result Value Ref Range   HIV Screen 4th Generation wRfx Non Reactive Non Reactive  Hepatitis C  antibody     Status: None   Collection Time: 03/24/18 11:38 AM  Result Value Ref Range   Hep C Virus Ab <0.1 0.0 - 0.9 s/co ratio    Comment:                                   Negative:     < 0.8                              Indeterminate: 0.8 - 0.9                                   Positive:     > 0.9  The CDC recommends that a positive HCV antibody result  be followed up with a HCV Nucleic Acid Amplification  test (161096).   Hepatitis B surface antigen     Status: None   Collection Time: 03/24/18 11:38 AM  Result Value Ref Range   Hepatitis B Surface Ag Negative Negative  HSV 1 AND 2 IGM ABS, INDIRECT     Status: None   Collection Time: 03/24/18 11:38 AM  Result Value Ref Range   HSV 1 IgM <1:10 <1:10 titer   HSV 2 IgM <1:10 <1:10 titer    Comment: HSV 1 and HSV 2 share many cross-reacting antigens. Elevated titers to both HSV 1 and HSV 2 may represent crossreactive HSV antibodies rather than exposure to both HSV 1 and HSV 2. Results for this test are for research purposes only by the assay's manufacturer. The performance characteristics of this product have not been established.  Results should not be used as a diagnostic procedure without confirmation of the diagnosis by another medically established diagnostic product or procedure.   HSV-2 IgG Supplemental Test     Status: Abnormal   Collection Time: 03/24/18 11:38 AM  Result Value Ref Range   HSV-2 IgG Supplemental Test Positive (A) Negative    Comment:  HSV-2 IgG          HSV-2 IgG Type Specific      Confirmation      Interpretation ------------------------------------------------------- Positive/Equivocal   Positive    Indicates the presence                                  of detectable IgG  antibodies to HSV-2. ------------------------------------------------------- Positive/Equivocal   Negative    Unable to confirm the                                  presence of IgG                                   antibodies to HSV-2.                                  Recommend retesting                                  in 2-4 weeks. -------------------------------------------------------   POCT urine pregnancy     Status: None   Collection Time: 03/24/18 12:16 PM  Result Value Ref Range   Preg Test, Ur Negative Negative  HSV(herpes simplex vrs) 1+2 ab-IgG     Status: Abnormal   Collection Time: 04/21/18 10:59 AM  Result Value Ref Range   HSV 1 Glycoprotein G Ab, IgG <0.91 0.00 - 0.90 index    Comment:                                  Negative        <0.91                                  Equivocal 0.91 - 1.09                                  Positive        >1.09  Note: Negative indicates no antibodies detected to  HSV-1. Equivocal may suggest early infection.  If  clinically appropriate, retest at later date. Positive  indicates antibodies detected to HSV-1.    HSV 2 IgG, Type Spec 2.86 (H) 0.00 - 0.90 index    Comment:                                  Negative        <0.91                                  Equivocal 0.91 - 1.09                                  Positive        >1.09  Note: Negative indicates no antibodies detected to  HSV-2. Equivocal may suggest early infection.  If  clinically appropriate, retest at later date. Positive  indicates antibodies detected to HSV-2.   HSV(herpes simplex vrs) 1+2 ab-IgM     Status: Abnormal   Collection Time: 04/21/18 10:59 AM  Result Value Ref Range   HSVI/II Comb IgM 1.12 (H) 0.00 - 0.90 Ratio    Comment:  Negative        <0.91                                  Equivocal 0.91 - 1.09                                  Positive        >1.09   HSV-2 IgG Supplemental Test     Status: Abnormal   Collection Time: 04/21/18 10:59 AM  Result Value Ref Range   HSV-2 IgG Supplemental Test Positive (A) Negative    Comment:  HSV-2 IgG          HSV-2 IgG Type Specific      Confirmation       Interpretation ------------------------------------------------------- Positive/Equivocal   Positive    Indicates the presence                                  of detectable IgG                                  antibodies to HSV-2. ------------------------------------------------------- Positive/Equivocal   Negative    Unable to confirm the                                  presence of IgG                                  antibodies to HSV-2.                                  Recommend retesting                                  in 2-4 weeks. -------------------------------------------------------      Assessment:   1. HSV-2 infection  Plan:   Education regarding symptoms, treatment and management of HSV; see AVS  Rx: Valtex, see orders  Reviewed red flag symptoms and when to call  RTC as needed   Gunnar Bulla, CNM [facility], [facility]

## 2018-05-30 ENCOUNTER — Other Ambulatory Visit: Payer: Self-pay | Admitting: Obstetrics and Gynecology

## 2018-06-12 ENCOUNTER — Encounter: Payer: Self-pay | Admitting: Physician Assistant

## 2018-06-28 ENCOUNTER — Ambulatory Visit (INDEPENDENT_AMBULATORY_CARE_PROVIDER_SITE_OTHER): Payer: BLUE CROSS/BLUE SHIELD | Admitting: Family Medicine

## 2018-06-28 ENCOUNTER — Encounter: Payer: Self-pay | Admitting: Family Medicine

## 2018-06-28 VITALS — BP 126/78 | HR 81 | Temp 98.1°F | Wt 298.6 lb

## 2018-06-28 DIAGNOSIS — T3995XA Adverse effect of unspecified nonopioid analgesic, antipyretic and antirheumatic, initial encounter: Secondary | ICD-10-CM

## 2018-06-28 DIAGNOSIS — G44209 Tension-type headache, unspecified, not intractable: Secondary | ICD-10-CM

## 2018-06-28 DIAGNOSIS — G4440 Drug-induced headache, not elsewhere classified, not intractable: Secondary | ICD-10-CM | POA: Diagnosis not present

## 2018-06-28 MED ORDER — CYCLOBENZAPRINE HCL 5 MG PO TABS: 5.0000 mg | ORAL_TABLET | Freq: Three times a day (TID) | ORAL | 1 refills | Status: DC | PRN

## 2018-06-28 NOTE — Patient Instructions (Signed)

## 2018-06-28 NOTE — Progress Notes (Signed)
Patient: Julia Lynch Female    DOB: Jun 24, 1976   42 y.o.   MRN: [id number] Visit Date: 06/28/2018  Today's Provider: Shirlee Latch, MD   Chief Complaint  Patient presents with  . Headache   Subjective:    I, Presley Raddle, CMA, am acting as a Neurosurgeon for Shirlee Latch, MD.   Headache   Episode onset: 5 days ago. The problem occurs constantly. The problem has been gradually improving. Pain location: base of neck radiating to frontal area. The pain quality is similar to prior headaches. The quality of the pain is described as pulsating. Associated symptoms include nausea. Pertinent negatives include no blurred vision, dizziness or vomiting. Exacerbated by: stress  Treatments tried: Flexeril and Diazepam. (PMH includes tension headaches)   Also with b/l jaw pain and tightness today.  Headache is starting to feel better today.  States that she has had a very stressful year with her husband cheating and separating.    Allergies  Allergen Reactions  . Ibuprofen     Other reaction(s): Other (See Comments) GI Upset  . Lodine [Etodolac]      Current Outpatient Medications:  .  citalopram (CELEXA) 40 MG tablet, Take 1 tablet (40 mg total) by mouth daily., Disp: 30 tablet, Rfl: 3 .  diazepam (VALIUM) 5 MG tablet, AS NEEDED FOR HEADACHES, NO MORE THAN 3 IN A DAY, Disp: 30 tablet, Rfl: 5 .  hydrochlorothiazide (HYDRODIURIL) 25 MG tablet, Take 1 tablet (25 mg total) by mouth daily., Disp: 90 tablet, Rfl: 3 .  naproxen sodium (ALEVE) 220 MG tablet, Take by mouth. Reported on 12/15/2015, Disp: , Rfl:  .  omeprazole (PRILOSEC) 20 MG capsule, TAKE 1 CAPSULE (20 MG TOTAL) BY MOUTH DAILY., Disp: 90 capsule, Rfl: 4 .  valACYclovir (VALTREX) 500 MG tablet, Take 1 tablet (500 mg total) by mouth daily. Can increase to twice a day for 5 days in the event of a recurrence, Disp: 30 tablet, Rfl: 12 .  Vitamin D, Ergocalciferol, (DRISDOL) 50000 units CAPS capsule, TAKE 1 CAPSULE (50,000  UNITS TOTAL) BY MOUTH 2 (TWO) TIMES A WEEK., Disp: 24 capsule, Rfl: 2  Review of Systems  Constitutional: Negative.   Eyes: Negative for blurred vision.  Respiratory: Negative.   Cardiovascular: Negative.   Gastrointestinal: Positive for nausea. Negative for vomiting.  Musculoskeletal: Negative.   Neurological: Positive for headaches. Negative for dizziness.    Social History   Tobacco Use  . Smoking status: Former Smoker    Years: 10.00    Last attempt to quit: 10/11/2004    Years since quitting: 13.7  . Smokeless tobacco: Never Used  Substance Use Topics  . Alcohol use: Yes    Alcohol/week: 0.0 standard drinks    Comment: Saturdays   Objective:   BP 126/78 (BP Location: Left Arm, Patient Position: Sitting, Cuff Size: Large)   Pulse 81   Temp 98.1 F (36.7 C) (Oral)   Wt 298 lb 9.6 oz (135.4 kg)   SpO2 98%   BMI 56.42 kg/m  Vitals:   06/28/18 1537  BP: 126/78  Pulse: 81  Temp: 98.1 F (36.7 C)  TempSrc: Oral  SpO2: 98%  Weight: 298 lb 9.6 oz (135.4 kg)     Physical Exam  Constitutional: She is oriented to person, place, and time. She appears well-developed and well-nourished. No distress.  HENT:  Head: Normocephalic and atraumatic.  Mouth/Throat: Oropharynx is clear and moist.  Eyes: Pupils are equal, round, and reactive to  light. EOM are normal. No scleral icterus.  Neck: Neck supple. No neck rigidity.  Neck muscle tightness, no TTP  Cardiovascular: Normal rate, regular rhythm, normal heart sounds and intact distal pulses.  No murmur heard. Pulmonary/Chest: Effort normal and breath sounds normal. No respiratory distress. She has no wheezes.  Abdominal: Soft. She exhibits no distension. There is no tenderness.  Musculoskeletal: She exhibits no edema.  Neurological: She is alert and oriented to person, place, and time. She has normal strength. No cranial nerve deficit. Coordination normal.  Skin: Skin is warm and dry. Capillary refill takes less than 2  seconds. No rash noted.  Psychiatric: She has a normal mood and affect. Her behavior is normal.  Vitals reviewed.      Assessment & Plan:   1. Tension headache - symptoms c/w tension headache, now resolving - this is a chronic and intermittent problem - neuro intact without migranous symptoms - likely 2/2 stress and possibly analgesic rebound  - discussed stretching, stress relief, and muscle relaxers prn - discussed return precautions  2. Analgesic rebound headache - may contribute to headaches - discussed avoidance of OTC analgesics - return precautions discussed   Meds ordered this encounter  Medications  . cyclobenzaprine (FLEXERIL) 5 MG tablet    Sig: Take 1 tablet (5 mg total) by mouth 3 (three) times daily as needed for muscle spasms.    Dispense:  30 tablet    Refill:  1     Return if symptoms worsen or fail to improve.   Approximately 25 minutes was spent in discussion of which greater than 50% was consultation.    The entirety of the information documented in the History of Present Illness, Review of Systems and Physical Exam were personally obtained by me. Portions of this information were initially documented by Presley RaddleNikki Walston, CMA and reviewed by me for thoroughness and accuracy.    Erasmo DownerBacigalupo, Quince Santana M, MD, MPH [facility] 06/28/2018 4:53 PM

## 2018-07-10 ENCOUNTER — Other Ambulatory Visit (HOSPITAL_COMMUNITY): Payer: Self-pay | Admitting: General Surgery

## 2018-07-13 ENCOUNTER — Other Ambulatory Visit: Payer: Self-pay | Admitting: Physician Assistant

## 2018-07-13 DIAGNOSIS — I10 Essential (primary) hypertension: Secondary | ICD-10-CM

## 2018-07-23 ENCOUNTER — Other Ambulatory Visit: Payer: Self-pay | Admitting: Physician Assistant

## 2018-07-23 DIAGNOSIS — F321 Major depressive disorder, single episode, moderate: Secondary | ICD-10-CM

## 2018-07-25 ENCOUNTER — Ambulatory Visit (HOSPITAL_COMMUNITY): Payer: BLUE CROSS/BLUE SHIELD

## 2018-07-25 ENCOUNTER — Other Ambulatory Visit (HOSPITAL_COMMUNITY): Payer: BLUE CROSS/BLUE SHIELD

## 2018-07-28 ENCOUNTER — Encounter: Payer: BLUE CROSS/BLUE SHIELD | Admitting: Obstetrics and Gynecology

## 2018-08-01 ENCOUNTER — Encounter: Payer: BLUE CROSS/BLUE SHIELD | Admitting: Obstetrics and Gynecology

## 2018-08-02 ENCOUNTER — Encounter: Payer: BLUE CROSS/BLUE SHIELD | Attending: General Surgery | Admitting: Dietician

## 2018-08-02 ENCOUNTER — Encounter: Payer: Self-pay | Admitting: Dietician

## 2018-08-02 VITALS — Ht 61.0 in | Wt 294.7 lb

## 2018-08-02 DIAGNOSIS — Z6841 Body Mass Index (BMI) 40.0 and over, adult: Secondary | ICD-10-CM | POA: Diagnosis not present

## 2018-08-02 NOTE — Progress Notes (Signed)
Nutrition Assessment  Proposed Surgery: Sleeve gastrectomy  MD: Adonis Housekeeper RD: Erlene Quan  Height: 5'1" Weight: 294.7lbs BMI: 55.7 Upper IBW% (UIBW): 253% (IBS 116lbs)  Patient's Goal Weight: < 200  Medical History: diabetes, HTN, GERD, sleep apnea Medications and Supplements: citalopram, cyclobenzaprine prn, diazepam prn, hydrochlorothiazide, omeprazole, valACYclovir  Previous surgeries: ankle surgery 1995 Drug allergies: ibuprofen, lodine Food allergies: none known Alcohol use: maybe one drink a week Tobacco use: none (quit 2006)  Physical activity: none due to knee pain  Weight history: Childhood: normal weight    Adolescence: normal weight but "curvy"    Adulthood: gradual increase throughout adulthood, with minor losses when following weight loss diets    Weight 1 year ago: 297lbs  Dieting/ weight loss history: worked with Physiological scientist x 1 year, with 10lbs weight loss (did lose inches, 1 dress size). Tried keto diet, lost 40lbs with diet and high stress; intermittent fasting recently but no weight loss after 2 weeks. In the past, met with RD and practiced low fat, low-calories with exercise. Multiple fad diets with short-term, limited success. Diagnosed with PCOS at age 70. Wants to lose weight to stay healthy for her three children  Dietary Recall:  Daily pattern: 3 meals and 0-1 snacks. Dining out: 1-2 meals per week. Breakfast: (after work) sometimes none; eggs with cheese Lunch: 2-3am at work sandwich Supper: recent meals-- [place] broil with lima beans and corn; buffalo chicken with salad wrap; hamburger helper with green beans, shrimp scampi with spaghetti squash; chicken alfredo no sugar added; occasional fast food (decreasing due to cost) Snack(s): ham or [place] roll-up  Beverages: water, 64+ oz daily; 2c coffee; occasional tea with Stevia or diet soda with stevia  Psychosocial: Emotional eating history: occasional increase in eating during stress; rarely binge  eating followed by fasting (none in past several months) Disordered eating history: none diagnosed   Intervention:  Patient has researched this procedure by consulting with a close family friend who has undergone weight loss surgery and has been successful, as well as her PCP and therapist.   Instructed her on pre-op diet guidelines, including liver reduction diet.   Discussed stages of the bariatric diet after surgery as well as the importance of adequate protein and fluid intake.   Summary:  Patient has made diet and lifestyle changes in effort to lose weight; seems to have worked diligently for many years with little success. .  She has solid support from sister, friends, family.   She agrees to work on reducing fluid intake during meals, chewing foods thoroughly, and further controlling carbohydrate food portions prior to surgery.   She is motivated to follow the bariatric diet after surgery. From a nutrition standpoint, she is ready to proceed with the bariatric surgery program.    Plan:  Patient commits to returning for  6 supervised weight loss visits prior to surgery.   She will plan to return for post-op RD visits beginning 2 weeks after surgery.

## 2018-08-02 NOTE — Patient Instructions (Signed)
   Continue making healthy food choices, great job!  Begin chewing foods thoroughly when eating.   Decrease the amount of fluid intake during meals by keeping glass away from the table, pouring a smaller glass, etc.   Think about and list non-food related activities to avoid stress eating.   Keep carbs to no more 45grams with meals.

## 2018-08-04 ENCOUNTER — Encounter (INDEPENDENT_AMBULATORY_CARE_PROVIDER_SITE_OTHER): Payer: BLUE CROSS/BLUE SHIELD | Admitting: Dietician

## 2018-08-04 ENCOUNTER — Encounter: Payer: Self-pay | Admitting: Dietician

## 2018-08-04 VITALS — Wt 293.7 lb

## 2018-08-04 DIAGNOSIS — Z6841 Body Mass Index (BMI) 40.0 and over, adult: Secondary | ICD-10-CM

## 2018-08-04 NOTE — Progress Notes (Signed)
Appt start time: 1300 end time:  1345.  Assessment:   1st SWL Appointment.   Start Wt at NDES: 294.7 lbs Wt: 293.7 lbs Ht: 5' 1"  BMI: 55.49  Preferred Learning Style:   No preference indicated   Learning Readiness:   Ready  MEDICATIONS: Celexa, Flexeril, Valium, HCTZ, Prilosec, Valtrex  DIETARY INTAKE: Breakfast: nothing or eggs with cheese Snack(s): ham or [place] roll up, Hot Cheetos, sweets sometimes Lunch: sandwich or leftovers Dinner: meat + a vegetable or two + sometimes a starch like mac n' cheese, taco salad. Beef ranges from 80-93% lean Beverages: 64+ oz of water daily, 2c coffee, diet soda or tea with Stevia  Usual physical activity: tries to walk but is limited d/t knee pain  Discussion: Pt has started to work on her initial goals which were set during her evaluation earlier this week. She is going on vacation tomorrow and has started to think about what she will do for meals that will allow her to make healthier choices, for example cooking meals at their condo rather than buying food in the [place]. Prior to this time she had not been thinking about her food choices and "if she wanted it she ate it." To better manage stress she is thinking about starting to write in a journal or write poetry. Her new work schedule of working third shift is an adjustment that she is slowly getting used to. She is finding that she has not been eating as much with this schedule and she is also eating out less. 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               Nutritional Diagnosis:  [place]-3.3 Overweight/obesity related to past poor dietary habits and physical inactivity as evidenced by patient w/ upcoming sleeve gastrectomy bariatric surgery following dietary guidelines for continued weight loss.              Intervention:  Nutrition counseling for upcoming Bariatric Surgery. Patient will continue to work on  separating eating from drinking fluids at / around meal times, chewing food slowly and more thoroughly, choosing non-food related stress relievers, reducing carbohydrate quantity at meal times (45g or less), and being more aware of her food choices / quality in general.  Teaching Method Utilized:  Auditory  Handouts given during visit include:  Pre-op goal sheet  Barriers to learning/adherence to lifestyle change: None  Demonstrated degree of understanding via:  Teach Back   Monitoring/Evaluation:  Dietary intake, exercise, and body weight in 4 week(s): November 22

## 2018-08-18 ENCOUNTER — Encounter: Payer: BLUE CROSS/BLUE SHIELD | Admitting: Obstetrics and Gynecology

## 2018-09-01 ENCOUNTER — Encounter: Payer: Self-pay | Admitting: Dietician

## 2018-09-01 ENCOUNTER — Encounter: Payer: BLUE CROSS/BLUE SHIELD | Attending: General Surgery | Admitting: Dietician

## 2018-09-01 VITALS — Wt 296.3 lb

## 2018-09-01 DIAGNOSIS — Z6841 Body Mass Index (BMI) 40.0 and over, adult: Secondary | ICD-10-CM | POA: Diagnosis not present

## 2018-09-01 NOTE — Progress Notes (Signed)
Appt start time: 1500 end time: 1530.  Assessment:2nd SWL Appointment.   Start Wt at NDES: 294.7 lbs Wt: 296.3 lbs   Ht: 5\' 1"  BMI: 55.99  Preferred Learning Style:   No preference indicated   Learning Readiness:   Ready  MEDICATIONS: Celexa, Flexeril, Valium, HCTZ, Prilosec, Valtrex  DIETARY INTAKE: Breakfast: nothing or eggs with cheese Snack(s): ham or [place] roll up, sweets sometimes Lunch: sandwich or leftovers Dinner: meat + a vegetable or two, eating less starches at meal times overall Beverages: 80+ oz of water daily, 2c coffee, diet soda or tea with Stevia occasionally  Usual physical activity: Has been trying to walk but continues to be limited d/t knee pain and new foot injury. [place] [employer] [place].  Discussion: Pt has continued to work on her initial goals of separating meals from fluid intake and chewing thoroughly. She has been trying to establish a regular eating schedule / routine but it has been challenging working third shift. She does however always make a point to have dinner with her family. Still feels that she has been eating less overall d/t new routine. On vacation she was able to utilize their condo to cook meals rather than rely on eating meals out. Since then she feels she has been making healthier food choices overall. She is thirsty often but is doing her best not to drink at meal times. She is also choosing mostly vegetables and protein to eat, and limiting carbohydrates.    Nutritional Diagnosis:  [place]-3.3 Overweight/obesity related to past poor dietary habits and physical inactivity as evidenced by patient w/ upcoming sleeve gastrectomy bariatric surgery following dietary guidelines for continued weight loss.   Intervention: Nutrition counseling for upcoming Bariatric Surgery. Patient will continue to work on separating eating from drinking fluids at / around meal times,  chewing food slowly and more thoroughly, and being more aware of her food choices / quality in general. For next session she will start to look at food labels to identify choices with less than 10g sugar and fat per serving  Teaching Method Utilized:  Auditory  Handouts given during visit include:  Pre-op goal sheet  'Bariatric plate' handout  Barriers to learning/adherence to lifestyle change: None  Demonstrated degree of understanding via: Teach Back   Monitoring/Evaluation: Dietary intake, exercise, and body weight in 4 week(s): December 20

## 2018-09-09 ENCOUNTER — Other Ambulatory Visit: Payer: Self-pay | Admitting: Physician Assistant

## 2018-09-09 DIAGNOSIS — R519 Headache, unspecified: Secondary | ICD-10-CM

## 2018-09-09 DIAGNOSIS — R51 Headache: Principal | ICD-10-CM

## 2018-09-22 ENCOUNTER — Encounter: Payer: BLUE CROSS/BLUE SHIELD | Admitting: Obstetrics and Gynecology

## 2018-09-29 ENCOUNTER — Ambulatory Visit: Payer: Self-pay | Admitting: Dietician

## 2018-10-02 ENCOUNTER — Encounter: Payer: BLUE CROSS/BLUE SHIELD | Attending: General Surgery | Admitting: Dietician

## 2018-10-02 VITALS — Ht 61.0 in | Wt 303.7 lb

## 2018-10-02 DIAGNOSIS — Z6841 Body Mass Index (BMI) 40.0 and over, adult: Secondary | ICD-10-CM | POA: Diagnosis not present

## 2018-10-02 NOTE — Patient Instructions (Signed)
Work to establish a more consistent meal pattern: 6:30 - Ex. Cottage cheese, fruit 2:00 pm snack- Ex. Ham/cheese roll-up, 3-4 crackers 5:00pm- (think of plate)- meat,starch, non-starchy vegetable) 8:45pm snack, 12:30 meal - leftovers or sandwich, fruit  Refer to food guide plate when planning meals: 1/4 plate protein, 1/4 plate starch and 1/2 plate non-starchy vegetables/fruit.  Continue with other pre-op goals such as practice chewing and having beverages separate from meals. Also, continue to decrease carbonated beverages.   Continue with walking dog at least once daily.

## 2018-10-02 NOTE — Progress Notes (Signed)
Medical Nutrition Therapy Follow-up visit:  Time with patient: 1340- 1420 Visit #: 3rd SWL appointment  ASSESSMENT:  Diagnosis:obesity  Current weight:303 lbs   Height:61 in Medications: no change from previous visit Medical History: hypertension, diabetes Progress and evaluation: Patient in for supervised weight loss visit in preparation for Sleeve bariatric surgery. She reports she has been snacking more. She doesn't typically eat after getting off work at [employer]6:00am. (work hours- 9:00pm-6:00am) She eats a small snack-type meal at 3:00pm after waking up on some of her work days and waits until 5:00 dinner meal on other days. She eats a snack just before going to work at 9:00pm and then eats a meal at 3:00am, typically leftovers or a sandwich. She  stated she has been snacking more between these times. Her main beverage is water along with 2 cups of coffee, occasional diet soda or tea with Stevia. She rarely drinks regular soda.  Her weight today is 7.4 lbs greater than weight 5 weeks ago.   Physical activity: Has been walking her dog when the weather allows; 1/2 mile.   NUTRITION CARE EDUCATION: Discussed importance of establishing a consistent meal pattern. Reviewed how to better balance meals using plate method. Reviewed pre-op goals and progress with goals.   NUTRITIONAL DIAGNOSIS:  [place]-3.3 Overweight/obesity As related to history of poor dietary habits  and lack of physical activity .  As evidenced by diet history and upcoming sleeve gastrectomy bariatric surgery. .. INTERVENTION: Work to establish a more consistent meal pattern: 6:30 - Ex. Cottage cheese, fruit 2:00 pm snack- Ex. Ham/cheese roll-up, 3-4 crackers 5:00pm- (think of plate)- meat,starch, non-starchy vegetable) 8:45pm snack, 12:30 meal - leftovers or sandwich, fruit  Refer to food guide plate when planning meals: 1/4 plate protein, 1/4 plate starch and 1/2 plate non-starchy vegetables/fruit.  Continue with other  pre-op goals such as practice chewing and having beverages separate from meals. Also, continue to decrease carbonated beverages.   Continue with walking dog at least once daily. EDUCATION MATERIALS GIVEN:  . Goals/ instructions        Patient has copy of pre-op goals.  LEARNER/ who was taught:  . Patient  LEVEL OF UNDERSTANDING:  Verbalizes understanding  Demonstrated degree of understanding via teach back.  LEARNING BARRIERS: . None  MONITORING AND EVALUATION:   Weight, diet, exercise Follow-up: 11/03/18

## 2018-10-13 ENCOUNTER — Ambulatory Visit (INDEPENDENT_AMBULATORY_CARE_PROVIDER_SITE_OTHER): Payer: BLUE CROSS/BLUE SHIELD | Admitting: Obstetrics and Gynecology

## 2018-10-13 ENCOUNTER — Encounter: Payer: Self-pay | Admitting: Obstetrics and Gynecology

## 2018-10-13 ENCOUNTER — Other Ambulatory Visit (HOSPITAL_COMMUNITY)
Admission: RE | Admit: 2018-10-13 | Discharge: 2018-10-13 | Disposition: A | Discharge status: Home / Self Care | Payer: BLUE CROSS/BLUE SHIELD | Source: Ambulatory Visit | Attending: Obstetrics and Gynecology | Admitting: Obstetrics and Gynecology

## 2018-10-13 VITALS — BP 140/84 | HR 109 | Ht 61.0 in | Wt 294.9 lb

## 2018-10-13 DIAGNOSIS — Z01419 Encounter for gynecological examination (general) (routine) without abnormal findings: Secondary | ICD-10-CM | POA: Insufficient documentation

## 2018-10-13 DIAGNOSIS — Z23 Encounter for immunization: Secondary | ICD-10-CM

## 2018-10-13 DIAGNOSIS — E139 Other specified diabetes mellitus without complications: Secondary | ICD-10-CM

## 2018-10-13 NOTE — Progress Notes (Signed)
Subjective:   Julia Lynch is a 43 y.o. G58P0 Caucasian female here for a routine well-woman exam.  Patient's last menstrual period was 09/29/2018.    Current complaints: none PCP: hedrick       does desire labs  Social History: Sexual: heterosexual Marital Status: separated Living situation: with family Occupation: works from [employer] Tobacco/alcohol: no tobacco use Illicit drugs: no history of illicit drug use  The following portions of the patient's history were reviewed and updated as appropriate: allergies, current medications, past family history, past medical history, past social history, past surgical history and problem list.  Past Medical History Past Medical History:  Diagnosis Date  . Anxiety   . Arthritis   . Depression   . Diabetic acidosis, type II (HCC)   . GERD (gastroesophageal reflux disease)   . History of chicken pox   . PCOS (polycystic ovarian syndrome)   . Vitamin D deficiency disease     Past Surgical History Past Surgical History:  Procedure Laterality Date  . left ankle repair  1995    Gynecologic History G3P0  Patient's last menstrual period was 09/29/2018. Contraception: tubal ligation Last Pap: 07/2017. Results were: ASCUS, HPV- Last mammogram: 08/2017. Results were: normal   Obstetric History OB History  Gravida Para Term Preterm AB Living  3         3  SAB TAB Ectopic Multiple Live Births          3    # Outcome Date GA Lbr Len/2nd Weight Sex Delivery Anes PTL Lv  3 Gravida 2013    M Vag-Spont   LIV  2 Gravida 2012    M Vag-Spont   LIV  1 Gravida 2007    M Vag-Spont   LIV    Current Medications Current Outpatient Medications on File Prior to Visit  Medication Sig Dispense Refill  . citalopram (CELEXA) 40 MG tablet TAKE 1 TABLET BY MOUTH EVERY DAY 90 tablet 3  . cyclobenzaprine (FLEXERIL) 5 MG tablet Take 1 tablet (5 mg total) by mouth 3 (three) times daily as needed for muscle spasms. 30 tablet 1  . diazepam  (VALIUM) 5 MG tablet TAKE AS NEEDED FOR HEADACHES, NO MORE THAN 3 IN A DAY 30 tablet 5  . hydrochlorothiazide (HYDRODIURIL) 25 MG tablet TAKE 1 TABLET BY MOUTH EVERY DAY 90 tablet 3  . omeprazole (PRILOSEC) 20 MG capsule TAKE 1 CAPSULE (20 MG TOTAL) BY MOUTH DAILY. 90 capsule 4  . valACYclovir (VALTREX) 500 MG tablet Take 1 tablet (500 mg total) by mouth daily. Can increase to twice a day for 5 days in the event of a recurrence 30 tablet 12   No current facility-administered medications on file prior to visit.     Review of Systems Patient denies any headaches, blurred vision, shortness of breath, chest pain, abdominal pain, problems with bowel movements, urination, or intercourse.  Objective:  BP 140/84   Pulse (!) 109   Ht 5\' 1"  (1.549 m)   Wt 294 lb 14.4 oz (133.8 kg)   LMP 09/29/2018   BMI 55.72 kg/m  Physical Exam  General:  Well developed, well nourished, no acute distress. She is alert and oriented x3. Skin:  Warm and dry Neck:  Midline trachea, no thyromegaly or nodules Cardiovascular: Regular rate and rhythm, no murmur heard Lungs:  Effort normal, all lung fields clear to auscultation bilaterally Breasts:  No dominant palpable mass, retraction, or nipple discharge Abdomen:  Soft, non tender, no hepatosplenomegaly or  masses Pelvic:  External genitalia is normal in appearance.  The vagina is normal in appearance. The cervix is bulbous, no CMT.  Thin prep pap is done with HR HPV cotesting. Uterus is felt to be normal size, shape, and contour.  No adnexal masses or tenderness noted. Extremities:  No swelling or varicosities noted Psych:  She has a normal mood and affect  Assessment:   Healthy well-woman exam Obesity HSV2 Depression Needs flu vaccine   Plan:  Plans gastric surgery in 2 months Flu vaccine given F/U 1 year for AE, or sooner if needed Mammogram ordered  Melody Suzan NailerN Shambley, CNM

## 2018-10-13 NOTE — Patient Instructions (Signed)
 Preventive Care 18-39 Years, Female Preventive care refers to lifestyle choices and visits with your health care provider that can promote health and wellness. What does preventive care include?   A yearly physical exam. This is also called an annual well check.  Dental exams once or twice a year.  Routine eye exams. Ask your health care provider how often you should have your eyes checked.  Personal lifestyle choices, including: ? Daily care of your teeth and gums. ? Regular physical activity. ? Eating a healthy diet. ? Avoiding tobacco and drug use. ? Limiting alcohol use. ? Practicing safe sex. ? Taking vitamin and mineral supplements as recommended by your health care provider. What happens during an annual well check? The services and screenings done by your health care provider during your annual well check will depend on your age, overall health, lifestyle risk factors, and family history of disease. Counseling Your health care provider may ask you questions about your:  Alcohol use.  Tobacco use.  Drug use.  Emotional well-being.  Home and relationship well-being.  Sexual activity.  Eating habits.  Work and work environment.  Method of birth control.  Menstrual cycle.  Pregnancy history. Screening You may have the following tests or measurements:  Height, weight, and BMI.  Diabetes screening. This is done by checking your blood sugar (glucose) after you have not eaten for a while (fasting).  Blood pressure.  Lipid and cholesterol levels. These may be checked every 5 years starting at age 20.  Skin check.  Hepatitis C blood test.  Hepatitis B blood test.  Sexually transmitted disease (STD) testing.  BRCA-related cancer screening. This may be done if you have a family history of breast, ovarian, tubal, or peritoneal cancers.  Pelvic exam and Pap test. This may be done every 3 years starting at age 21. Starting at age 30, this may be done  every 5 years if you have a Pap test in combination with an HPV test. Discuss your test results, treatment options, and if necessary, the need for more tests with your health care provider. Vaccines Your health care provider may recommend certain vaccines, such as:  Influenza vaccine. This is recommended every year.  Tetanus, diphtheria, and acellular pertussis (Tdap, Td) vaccine. You may need a Td booster every 10 years.  Varicella vaccine. You may need this if you have not been vaccinated.  HPV vaccine. If you are 26 or younger, you may need three doses over 6 months.  Measles, mumps, and rubella (MMR) vaccine. You may need at least one dose of MMR. You may also need a second dose.  Pneumococcal 13-valent conjugate (PCV13) vaccine. You may need this if you have certain conditions and were not previously vaccinated.  Pneumococcal polysaccharide (PPSV23) vaccine. You may need one or two doses if you smoke cigarettes or if you have certain conditions.  Meningococcal vaccine. One dose is recommended if you are age 19-21 years and a first-year college student living in a residence hall, or if you have one of several medical conditions. You may also need additional booster doses.  Hepatitis A vaccine. You may need this if you have certain conditions or if you travel or work in places where you may be exposed to hepatitis A.  Hepatitis B vaccine. You may need this if you have certain conditions or if you travel or work in places where you may be exposed to hepatitis B.  Haemophilus influenzae type b (Hib) vaccine. You may need this if   you have certain risk factors. Talk to your health care provider about which screenings and vaccines you need and how often you need them. This information is not intended to replace advice given to you by your health care provider. Make sure you discuss any questions you have with your health care provider. Document Released: 11/23/2001 Document Revised:  05/10/2017 Document Reviewed: 07/29/2015 Elsevier Interactive Patient Education  2019 Elsevier Inc.  

## 2018-10-14 LAB — COMPREHENSIVE METABOLIC PANEL
ALBUMIN: 4 g/dL (ref 3.5–5.5)
ALK PHOS: 92 IU/L (ref 39–117)
ALT: 24 IU/L (ref 0–32)
AST: 18 IU/L (ref 0–40)
Albumin/Globulin Ratio: 1.4 (ref 1.2–2.2)
BILIRUBIN TOTAL: 0.3 mg/dL (ref 0.0–1.2)
BUN / CREAT RATIO: 15 (ref 9–23)
BUN: 12 mg/dL (ref 6–24)
CALCIUM: 9.7 mg/dL (ref 8.7–10.2)
CHLORIDE: 97 mmol/L (ref 96–106)
CO2: 24 mmol/L (ref 20–29)
CREATININE: 0.81 mg/dL (ref 0.57–1.00)
GFR, EST AFRICAN AMERICAN: 104 mL/min/{1.73_m2} (ref >59)
GFR, EST NON AFRICAN AMERICAN: 90 mL/min/{1.73_m2} (ref >59)
GLOBULIN, TOTAL: 2.9 g/dL (ref 1.5–4.5)
Glucose: 202 mg/dL — ABNORMAL HIGH (ref 65–99)
Potassium: 4 mmol/L (ref 3.5–5.2)
SODIUM: 138 mmol/L (ref 134–144)
Total Protein: 6.9 g/dL (ref 6.0–8.5)

## 2018-10-14 LAB — VITAMIN D 25 HYDROXY (VIT D DEFICIENCY, FRACTURES): Vit D, 25-Hydroxy: 22.2 ng/mL — ABNORMAL LOW (ref 30.0–100.0)

## 2018-10-14 LAB — LIPID PANEL
CHOL/HDL RATIO: 4.7 ratio — AB (ref 0.0–4.4)
CHOLESTEROL TOTAL: 210 mg/dL — AB (ref 100–199)
HDL: 45 mg/dL (ref >39)
LDL CALC: 107 mg/dL — AB (ref 0–99)
Triglycerides: 291 mg/dL — ABNORMAL HIGH (ref 0–149)
VLDL CHOLESTEROL CAL: 58 mg/dL — AB (ref 5–40)

## 2018-10-14 LAB — TSH: TSH: 0.773 u[IU]/mL (ref 0.450–4.500)

## 2018-10-14 LAB — HEMOGLOBIN A1C
ESTIMATED AVERAGE GLUCOSE: 183 mg/dL
Hgb A1c MFr Bld: 8 % — ABNORMAL HIGH (ref 4.8–5.6)

## 2018-10-17 LAB — CYTOLOGY - PAP
Adequacy: ABSENT
CHLAMYDIA, DNA PROBE: NEGATIVE
DIAGNOSIS: NEGATIVE
HPV (WINDOPATH): NOT DETECTED
Neisseria Gonorrhea: NEGATIVE

## 2018-10-19 ENCOUNTER — Other Ambulatory Visit: Payer: Self-pay | Admitting: Obstetrics and Gynecology

## 2018-10-19 MED ORDER — VITAMIN D (ERGOCALCIFEROL) 1.25 MG (50000 UNIT) PO CAPS: 50000.0000 [IU] | ORAL_CAPSULE | ORAL | 1 refills | Status: DC

## 2018-10-20 ENCOUNTER — Other Ambulatory Visit: Payer: Self-pay | Admitting: Family Medicine

## 2018-11-03 ENCOUNTER — Ambulatory Visit: Payer: Self-pay | Admitting: Dietician

## 2018-11-03 ENCOUNTER — Telehealth: Payer: Self-pay | Admitting: Dietician

## 2018-11-03 NOTE — Telephone Encounter (Signed)
Returned a IT sales professional from patient to reschedule her appointment for today due to flu illness. Unable to leave a message for patient as her mailbox is full.

## 2018-11-06 ENCOUNTER — Telehealth: Payer: Self-pay | Admitting: Dietician

## 2018-11-06 NOTE — Telephone Encounter (Signed)
Called patient to reschedule her appointment from 11/03/18; rescheduled for 11/10/18.

## 2018-11-10 ENCOUNTER — Encounter: Payer: BLUE CROSS/BLUE SHIELD | Attending: General Surgery | Admitting: Dietician

## 2018-11-10 ENCOUNTER — Encounter: Payer: Self-pay | Admitting: Dietician

## 2018-11-10 VITALS — Ht 61.0 in | Wt 295.2 lb

## 2018-11-10 DIAGNOSIS — Z6841 Body Mass Index (BMI) 40.0 and over, adult: Secondary | ICD-10-CM | POA: Diagnosis not present

## 2018-11-10 NOTE — Patient Instructions (Signed)
   Continue with current eating pattern, great job working on diet changes!  Keep up regular exercise, consider adding gym workouts or strength exercise as able.

## 2018-11-10 NOTE — Progress Notes (Signed)
Appt start time: 1315 end time:  1355.  Assessment:   #4 SWL Appointment.   Start Wt at NDES: 294.7lbs Wt: 295.2lbs Ht: 5'1" BMI: 55.78  Preferred Learning Style:   Hands on   Learning Readiness:   Change in progress  Medications: citalopram, cyclobenzaprine prn, diazepam prn, hydrochlorothiazide, omeprazole, valACYclovir, Vitamin D  Progress:   Patient has lost 8.5lbs since previous visit on 10/02/18; she has been working to follow a ketogenic diet plan with some intermittent fasting. She reports feeling better since losing from her high weight of 303lbs.   She has been reducing fluid intake during meals by keeping water glass away from the table when eating.   Patient reports recent HbA1C test result of 8%; she will be contacting her PCP as instructed.   Dietary Intake: 24-hr recall:  Breakfast: light -- cottage cheese and blackberries; about once a week none due to fasting (extended fasting for weight loss) Sleeps until 2-3pm Snack: none Lunch: 5pm full meal chicken, salad; steak + cauliflower with bacon, cheese and sour cream; spinach with cream cheese + parmesan + meat Snack: 9-9:30pm cheese stick or roll-up of deli meat and cheese; coffee with creamer and stevia; cottage cheese and berries;  Dinner: 12am usually leftovers; or omelet Snack: occasionally 4am same as pm snack Beverages: water, sometimes with BCAA powder, coffee, occasional diet soda 1-2 times a week, unsweetened tea  Usual physical activity: walking dog daily; plans to begin gym visits soon.  Diet to Follow: Patient will continue following keto style diet, restricting carbs to 15-30g per day.               Nutritional Diagnosis:  [place]-3.3 Overweight/obesity related to history of excess calories and physical inactivity as evidenced by patient current BMI of 55.78, following dietary guidelines for continued weight loss prior to bariatric surgery.              Intervention:   . Nutrition counseling for  upcoming bariatric surgery. . Discussed stress management -- physical effects of stress on body/ brain chemistry, food cravings; importance of having non-food stress management strategies in place.  . Reviewed bariatric diet goals and basics of pre-op diet. . Discussed factors affecting long-term weight loss success.  . Patient's recent diet changes have likely had a positive impact on her BG control.   Teaching Method Utilized:  Visual Auditory Hands on  Handouts given during visit include:  52 Stress Reducers  Goals and Instructions  Barriers to learning/adherence to lifestyle change: none  Demonstrated degree of understanding via:  Teach Back   Monitoring/Evaluation:  Dietary intake, exercise, BG control, and body weight 12/04/18.

## 2018-11-13 ENCOUNTER — Ambulatory Visit (INDEPENDENT_AMBULATORY_CARE_PROVIDER_SITE_OTHER): Payer: BLUE CROSS/BLUE SHIELD | Admitting: Physician Assistant

## 2018-11-13 ENCOUNTER — Encounter: Payer: Self-pay | Admitting: Physician Assistant

## 2018-11-13 VITALS — BP 132/84 | HR 85 | Temp 97.5°F | Resp 16 | Wt 296.0 lb

## 2018-11-13 DIAGNOSIS — E1165 Type 2 diabetes mellitus with hyperglycemia: Secondary | ICD-10-CM | POA: Diagnosis not present

## 2018-11-13 DIAGNOSIS — Z6841 Body Mass Index (BMI) 40.0 and over, adult: Secondary | ICD-10-CM

## 2018-11-13 DIAGNOSIS — I10 Essential (primary) hypertension: Secondary | ICD-10-CM | POA: Diagnosis not present

## 2018-11-13 DIAGNOSIS — F320 Major depressive disorder, single episode, mild: Secondary | ICD-10-CM

## 2018-11-13 DIAGNOSIS — E782 Mixed hyperlipidemia: Secondary | ICD-10-CM | POA: Insufficient documentation

## 2018-11-13 MED ORDER — ESCITALOPRAM OXALATE 20 MG PO TABS: 20.0000 mg | ORAL_TABLET | Freq: Every day | ORAL | 1 refills | Status: DC

## 2018-11-13 MED ORDER — METFORMIN HCL ER 500 MG PO TB24: 1000.0000 mg | ORAL_TABLET | Freq: Every day | ORAL | 1 refills | Status: DC

## 2018-11-13 NOTE — Progress Notes (Signed)
Patient: Julia Lynch Female    DOB: 05/27/1976   43 y.o.   MRN: [id number] Visit Date: 11/19/2018  Today's Provider: Margaretann LovelessJennifer M Kaire Stary, PA-C   Chief Complaint  Patient presents with  . Hypertension  . Hyperglycemia   Subjective:     HPI   Diabetes Mellitus Type II, Follow-up:   Lab Results  Component Value Date   HGBA1C 8.0 (H) 10/13/2018   HGBA1C 6.5 01/31/2018   HGBA1C 6.7 (H) 07/27/2017    Last seen for diabetes more than a year ago. Patient's A1C was checked by her GYN. Management since then includes dieting and exercising. Patient reports that she id well at first and than she had problems at home and started drinking but now she has started keto and IF for the past 3 weeks. Current symptoms include foot ulcerations, increase appetite, polydipsia, polyuria and visual disturbances and have been unchanged. Home blood sugar records: not checking   Current Insulin Regimen: none Most Recent Eye Exam: 2 yrs ago Weight trend: increasing steadily Current diet: Keto and IF Patient's sees a nutritionist. She is also seeing Dr. Johna SheriffHoxworth for consideration of bariatric surgery (gastric sleeve). Current exercise: walking  Pertinent Labs:    Component Value Date/Time   CHOL 210 (H) 10/13/2018 1605   TRIG 291 (H) 10/13/2018 1605   HDL 45 10/13/2018 1605   LDLCALC 107 (H) 10/13/2018 1605   CREATININE 0.81 10/13/2018 1605    Wt Readings from Last 3 Encounters:  11/13/18 296 lb (134.3 kg)  11/10/18 295 lb 3.2 oz (133.9 kg)  10/13/18 294 lb 14.4 oz (133.8 kg)   ------------------------------------------------------------------------   Depression: Patient complains of depression. She complains of depressed mood, fatigue, hopelessness and weight gain. Onset was approximately a few weeks ago, gradually worsening since that time.  She denies current suicidal and homicidal plan or intent.   Family history significant for anxiety, depression and substance  abuse.Possible organic causes contributing are: seperation.  Risk factors: positive family history in  grandfather, mother and uncle. Main source of depression is marital issues. She and her husband are in counseling at this time trying to work on their marriage.  Allergies  Allergen Reactions  . Ibuprofen     Other reaction(s): Other (See Comments) GI Upset  . Lodine [Etodolac]      Current Outpatient Medications:  .  cyclobenzaprine (FLEXERIL) 5 MG tablet, Take 1 tablet (5 mg total) by mouth 3 (three) times daily as needed for muscle spasms., Disp: 30 tablet, Rfl: 1 .  diazepam (VALIUM) 5 MG tablet, TAKE AS NEEDED FOR HEADACHES, NO MORE THAN 3 IN A DAY, Disp: 30 tablet, Rfl: 5 .  hydrochlorothiazide (HYDRODIURIL) 25 MG tablet, TAKE 1 TABLET BY MOUTH EVERY DAY, Disp: 90 tablet, Rfl: 3 .  omeprazole (PRILOSEC) 20 MG capsule, TAKE 1 CAPSULE BY MOUTH EVERY DAY, Disp: 90 capsule, Rfl: 4 .  valACYclovir (VALTREX) 500 MG tablet, Take 1 tablet (500 mg total) by mouth daily. Can increase to twice a day for 5 days in the event of a recurrence, Disp: 30 tablet, Rfl: 12 .  Vitamin D, Ergocalciferol, (DRISDOL) 1.25 MG (50000 UT) CAPS capsule, Take 1 capsule (50,000 Units total) by mouth 2 (two) times a week., Disp: 30 capsule, Rfl: 1 .  escitalopram (LEXAPRO) 20 MG tablet, Take 1 tablet (20 mg total) by mouth daily., Disp: 90 tablet, Rfl: 1 .  metFORMIN (GLUCOPHAGE-XR) 500 MG 24 hr tablet, Take 2 tablets (1,000 mg total)  by mouth daily with breakfast., Disp: 180 tablet, Rfl: 1  Review of Systems  Constitutional: Positive for activity change, appetite change and fatigue.  Eyes: Positive for visual disturbance.  Respiratory: Negative.   Cardiovascular: Negative for chest pain, palpitations and leg swelling.  Gastrointestinal: Negative.   Endocrine: Negative.   Neurological: Negative.   Psychiatric/Behavioral: Positive for dysphoric mood. Negative for self-injury.    Social History   Tobacco Use   . Smoking status: Former Smoker    Years: 10.00    Last attempt to quit: 10/11/2004    Years since quitting: 14.1  . Smokeless tobacco: Never Used  Substance Use Topics  . Alcohol use: Yes    Alcohol/week: 1.0 standard drinks    Types: 1 Standard drinks or equivalent per week    Comment: Saturdays      Objective:   BP 132/84 (BP Location: Left Arm, Patient Position: Sitting, Cuff Size: Large)   Pulse 85   Temp (!) 97.5 F (36.4 C) (Oral)   Resp 16   Wt 296 lb (134.3 kg)   BMI 55.93 kg/m  Vitals:   11/13/18 1705  BP: 132/84  Pulse: 85  Resp: 16  Temp: (!) 97.5 F (36.4 C)  TempSrc: Oral  Weight: 296 lb (134.3 kg)     Physical Exam Vitals signs reviewed.  Constitutional:      General: She is not in acute distress.    Appearance: She is well-developed. She is obese. She is not diaphoretic.  Neck:     Musculoskeletal: Normal range of motion and neck supple.     Thyroid: No thyromegaly.  Cardiovascular:     Rate and Rhythm: Normal rate and regular rhythm.     Heart sounds: Normal heart sounds. No murmur. No friction rub. No gallop.   Pulmonary:     Effort: Pulmonary effort is normal. No respiratory distress.     Breath sounds: Normal breath sounds. No wheezing or rales.  Neurological:     Mental Status: She is alert.  Psychiatric:        Attention and Perception: Attention normal.        Mood and Affect: Mood is depressed.        Speech: Speech normal.        Behavior: Behavior normal.        Thought Content: Thought content normal.         Assessment & Plan    1. Benign essential HTN Stable today. Continue HCTZ 25mg .   2. Type 2 diabetes mellitus with hyperglycemia, without long-term current use of insulin (HCC) Uncontrolled. Add metformin XR 1000mg  (2-500mg ) as below. Continue lifestyle modifications. I will see her back in 1-2 months.  - metFORMIN (GLUCOPHAGE-XR) 500 MG 24 hr tablet; Take 2 tablets (1,000 mg total) by mouth daily with breakfast.   Dispense: 180 tablet; Refill: 1  3. Class 3 severe obesity due to excess calories with serious comorbidity and body mass index (BMI) of 50.0 to 59.9 in adult [facility]) Counseled patient on healthy lifestyle modifications including dieting and exercise.   4. Morbidly obese (HCC) See above medical treatment plan.   5. Hypercholesterolemia with hypertriglyceridemia Stable. Patient declines cholesterol lowering medications at this time.   6. Depression, major, single episode, mild (HCC) Change citalopram to lexapro as below due to uncontrolled symptoms. I will see her back in 4-6 weeks to see how she is tolerating and see if symptoms improving.  - escitalopram (LEXAPRO) 20 MG tablet; Take 1 tablet (20  mg total) by mouth daily.  Dispense: 90 tablet; Refill: 1     Margaretann LovelessJennifer M Vian Fluegel, PA-C  [facility]

## 2018-11-13 NOTE — Patient Instructions (Signed)
Metformin extended-release tablets  What is this medicine?  METFORMIN (met FOR min) is used to treat type 2 diabetes. It helps to control blood sugar. Treatment is combined with diet and exercise. This medicine can be used alone or with other medicines for diabetes.  This medicine may be used for other purposes; ask your health care provider or pharmacist if you have questions.  COMMON BRAND NAME(S): Fortamet, Glucophage XR, Glumetza  What should I tell my health care provider before I take this medicine?  They need to know if you have any of these conditions:  -anemia  -dehydration  -heart disease  -frequently drink alcohol-containing beverages  -kidney disease  -liver disease  -polycystic ovary syndrome  -serious infection or injury  -vomiting  -an unusual or allergic reaction to metformin, other medicines, foods, dyes, or preservatives  -pregnant or trying to get pregnant  -breast-feeding  How should I use this medicine?  Take this medicine by mouth with a glass of water. Follow the directions on the prescription label. Take this medicine with food. Take your medicine at regular intervals. Do not take your medicine more often than directed. Do not stop taking except on your doctor's advice.  Talk to your pediatrician regarding the use of this medicine in children. Special care may be needed.  Overdosage: If you think you have taken too much of this medicine contact a poison control center or emergency room at once.  NOTE: This medicine is only for you. Do not share this medicine with others.  What if I miss a dose?  If you miss a dose, take it as soon as you can. If it is almost time for your next dose, take only that dose. Do not take double or extra doses.  What may interact with this medicine?  Do not take this medicine with any of the following medications:  -certain contrast medicines given before X-rays, CT scans, MRI, or other procedures  -dofetilide  This medicine may also interact with the following  medications:  -acetazolamide  -alcohol  -certain antivirals for HIV or hepatitis  -certain medicines for blood pressure, heart disease, irregular heart beat  -cimetidine  -dichlorphenamide  -digoxin  -diuretics  -female hormones, like estrogens or progestins and birth control pills  -glycopyrrolate  -isoniazid  -lamotrigine  -memantine  -methazolamide  -metoclopramide  -midodrine  -niacin  -phenothiazines like chlorpromazine, mesoridazine, prochlorperazine, thioridazine  -phenytoin  -ranolazine  -steroid medicines like prednisone or cortisone  -stimulant medicines for attention disorders, weight loss, or to stay awake  -thyroid medicines  -topiramate  -trospium  -vandetanib  -zonisamide  This list may not describe all possible interactions. Give your health care provider a list of all the medicines, herbs, non-prescription drugs, or dietary supplements you use. Also tell them if you smoke, drink alcohol, or use illegal drugs. Some items may interact with your medicine.  What should I watch for while using this medicine?  Visit your doctor or health care professional for regular checks on your progress.  A test called the HbA1C (A1C) will be monitored. This is a simple blood test. It measures your blood sugar control over the last 2 to 3 months. You will receive this test every 3 to 6 months.  Learn how to check your blood sugar. Learn the symptoms of low and high blood sugar and how to manage them.  Always carry a quick-source of sugar with you in case you have symptoms of low blood sugar. Examples include hard sugar candy or   or exercising more than usual, you might need to  change the dose of your medicine. Do not skip meals. Ask your doctor or health care professional if you should avoid alcohol. Many nonprescription cough and cold products contain sugar or alcohol. These can affect blood sugar. This medicine may cause ovulation in premenopausal women who do not have regular monthly periods. This may increase your chances of becoming pregnant. You should not take this medicine if you become pregnant or think you may be pregnant. Talk with your doctor or health care professional about your birth control options while taking this medicine. Contact your doctor or health care professional right away if you think you are pregnant. The tablet shell for some brands of this medicine does not dissolve. This is normal. The tablet shell may appear whole in the stool. This is not a cause for concern. If you are going to need surgery, a MRI, CT scan, or other procedure, tell your doctor that you are taking this medicine. You may need to stop taking this medicine before the procedure. Wear a medical ID bracelet or chain, and carry a card that describes your disease and details of your medicine and dosage times. This medicine may cause a decrease in folic acid and vitamin B12. You should make sure that you get enough vitamins while you are taking this medicine. Discuss the foods you eat and the vitamins you take with your health care professional. What side effects may I notice from receiving this medicine? Side effects that you should report to your doctor or health care professional as soon as possible: -allergic reactions like skin rash, itching or hives, swelling of the face, lips, or tongue -breathing problems -feeling faint or lightheaded, falls -muscle aches or pains -signs and symptoms of low blood sugar such as feeling anxious, confusion, dizziness, increased hunger, unusually weak or tired, sweating, shakiness, cold, irritable, headache, blurred vision, fast heartbeat, loss of  consciousness -slow or irregular heartbeat -unusual stomach pain or discomfort -unusually tired or weak Side effects that usually do not require medical attention (report to your doctor or health care professional if they continue or are bothersome): -diarrhea -headache -heartburn -metallic taste in mouth -nausea -stomach gas, upset This list may not describe all possible side effects. Call your doctor for medical advice about side effects. You may report side effects to FDA at 1-800-FDA-1088. Where should I keep my medicine? Keep out of the reach of children. Store at room temperature between 15 and 30 degrees C (59 and 86 degrees F). Protect from light. Throw away any unused medicine after the expiration date. NOTE: This sheet is a summary. It may not cover all possible information. If you have questions about this medicine, talk to your doctor, pharmacist, or health care provider.  2019 Elsevier/Gold Standard (2017-11-03 18:58:32)

## 2018-12-04 ENCOUNTER — Encounter: Payer: Self-pay | Admitting: Dietician

## 2018-12-04 ENCOUNTER — Encounter: Payer: BLUE CROSS/BLUE SHIELD | Attending: General Surgery | Admitting: Dietician

## 2018-12-04 VITALS — Ht 61.0 in | Wt 293.5 lb

## 2018-12-04 DIAGNOSIS — Z6841 Body Mass Index (BMI) 40.0 and over, adult: Secondary | ICD-10-CM | POA: Diagnosis not present

## 2018-12-04 NOTE — Patient Instructions (Signed)
   Continue current eating pattern.   Keep working to gradually increase physical activity; try starting gym visits on an easier day ie Saturday, and allow yourself a short workout, and gradually increase time and frequency as energy increases.   Start checking into options for protein drinks after surgery, homemade or ready-made, protein goal is 15 grams or more per serving and 5g carb or less per serving.

## 2018-12-04 NOTE — Progress Notes (Signed)
Appt start time: 1630 end time:  1700.  Assessment:   #5 SWL Appointment.   Start Wt at NDES: 294.7lbs Wt: 293.5lbs Ht: 5'1" BMI: 55.46  Preferred Learning Style:   Hands on  Learning Readiness:   Change in progress  MEDICATIONS: citalopram, cyclobenzaprine prn, diazepam prn, hydrochlorothiazide, metFORMIN, omeprazole, valACYclovir, Vitamin D  Progress:  Weight loss of 2lbs since previous visit on 11/10/18; patient voices ongoing concern with slow progression of weight loss despite diet changes.   Patient continues to follow low-carb, high protein, moderate fat diet with some intermittent fasting.   DIETARY INTAKE: 24-hr recall:  Breakfast: 6pm cottage cheese and fruit; cheese + berries; ham and cream cheese roll-up + broccoli with Ranch  Snack: none  Lunch: 6-7pm chicken/ steak + vegetables ie broccoli, cauliflower Snack: none Dinner: 12 am leftovers from dinner Snack: 2am cheese and fruit Beverages: water (less BCAA powder recently), sometimes with stevia-sweetened flavoring, coffee daily, tea with stevia, occasional diet soda-- does not keep at home   Usual physical activity: walking dog most days; no gym visits yet, continues to work on scheduling  Diet to Follow:  Patient will continue to follow keto-style eating pattern with some intermittent fasting for now.               Nutritional Diagnosis:  [place]-3.3 Overweight/obesity related to history of excess calories and physical inactivity as evidenced by patient current BMI of 55.46, following dietary guidelines for continued weight loss prior to bariatric surgery.              Intervention:   . Nutrition counseling for weight loss prior to upcoming bariatric surgery. . Discussed options for protein shakes for pre-op and post-op diet, and importance of low carb and high protein choices. . Reviewed pre-op goals and patient's progress.   Teaching Method Utilized:  Visual Auditory Hands on  Handouts given during visit  include:  Goals and instructions   Barriers to learning/adherence to lifestyle change: none  Demonstrated degree of understanding via:  Teach Back   Monitoring/Evaluation:  Dietary intake, exercise, and body weight 01/05/19.

## 2018-12-08 ENCOUNTER — Encounter: Payer: Self-pay | Admitting: Physician Assistant

## 2018-12-08 LAB — HM DIABETES EYE EXAM

## 2018-12-15 ENCOUNTER — Encounter: Payer: Self-pay | Admitting: Physician Assistant

## 2018-12-15 ENCOUNTER — Ambulatory Visit (INDEPENDENT_AMBULATORY_CARE_PROVIDER_SITE_OTHER): Payer: BLUE CROSS/BLUE SHIELD | Admitting: Physician Assistant

## 2018-12-15 VITALS — BP 122/82 | HR 90 | Temp 97.8°F | Resp 16 | Wt 287.0 lb

## 2018-12-15 DIAGNOSIS — E1165 Type 2 diabetes mellitus with hyperglycemia: Secondary | ICD-10-CM | POA: Diagnosis not present

## 2018-12-15 DIAGNOSIS — F321 Major depressive disorder, single episode, moderate: Secondary | ICD-10-CM

## 2018-12-15 DIAGNOSIS — L2084 Intrinsic (allergic) eczema: Secondary | ICD-10-CM | POA: Diagnosis not present

## 2018-12-15 LAB — POCT GLYCOSYLATED HEMOGLOBIN (HGB A1C)
Est. average glucose Bld gHb Est-mCnc: 157
Hemoglobin A1C: 7.1 % — AB (ref 4.0–5.6)

## 2018-12-15 MED ORDER — TRIAMCINOLONE ACETONIDE 0.1 % EX CREA: 1.0000 "application " | TOPICAL_CREAM | Freq: Two times a day (BID) | CUTANEOUS | 0 refills | Status: DC

## 2018-12-15 NOTE — Progress Notes (Signed)
Patient: Julia Lynch Female    DOB: Nov 29, 1975   43 y.o.   MRN: [id number] Visit Date: 12/15/2018  Today's Provider: Margaretann Loveless, PA-C   Chief Complaint  Patient presents with  . Follow-up   Subjective:     HPI  Depression, Follow-up  She  was last seen for this 1 months ago.  Changes made at last visit include. Change citalopram to lexapro as below due to uncontrolled symptoms. Patient stayed on citalopram. Started feeling better soon after appointment so decided not to change. She is also in counseling and that is helping.   She reports good compliance with treatment. She is not having side effects.   She reports excellent tolerance of treatment. Current symptoms include: patient denies any symptoms currently She feels she is Improved since last visit.  ------------------------------------------------------------------------  Diabetes Mellitus Type II, Follow-up:   Lab Results  Component Value Date   HGBA1C 7.1 (A) 12/15/2018   HGBA1C 8.0 (H) 10/13/2018   HGBA1C 6.5 01/31/2018    Last seen for diabetes 1 months ago.  Management since then includes Uncontrolled. Add metformin XR 1000mg  (2-500mg ) as below. Continue lifestyle modifications. She is doing Keto and intermittent fasting.  She reports excellent compliance with treatment. She is not having side effects.  Current symptoms include none and have been stable. Home blood sugar records: not being checked  Episodes of hypoglycemia? no   Current Insulin Regimen:  Most Recent Eye Exam: UTD, [facility] Weight trend: stable Prior visit with dietician: No Current exercise: none Current diet habits: not asked   Pertinent Labs:    Component Value Date/Time   CHOL 210 (H) 10/13/2018 1605   TRIG 291 (H) 10/13/2018 1605   HDL 45 10/13/2018 1605   LDLCALC 107 (H) 10/13/2018 1605   CREATININE 0.81 10/13/2018 1605    Wt Readings from Last 3 Encounters:  12/15/18 287 lb (130.2  kg)  12/04/18 293 lb 8 oz (133.1 kg)  11/13/18 296 lb (134.3 kg)   ------------------------------------------------------------------------    Allergies  Allergen Reactions  . Ibuprofen     Other reaction(s): Other (See Comments) GI Upset  . Lodine [Etodolac]      Current Outpatient Medications:  .  citalopram (CELEXA) 40 MG tablet, , Disp: , Rfl:  .  cyclobenzaprine (FLEXERIL) 5 MG tablet, Take 1 tablet (5 mg total) by mouth 3 (three) times daily as needed for muscle spasms., Disp: 30 tablet, Rfl: 1 .  diazepam (VALIUM) 5 MG tablet, TAKE AS NEEDED FOR HEADACHES, NO MORE THAN 3 IN A DAY, Disp: 30 tablet, Rfl: 5 .  hydrochlorothiazide (HYDRODIURIL) 25 MG tablet, TAKE 1 TABLET BY MOUTH EVERY DAY, Disp: 90 tablet, Rfl: 3 .  metFORMIN (GLUCOPHAGE-XR) 500 MG 24 hr tablet, Take 2 tablets (1,000 mg total) by mouth daily with breakfast., Disp: 180 tablet, Rfl: 1 .  omeprazole (PRILOSEC) 20 MG capsule, TAKE 1 CAPSULE BY MOUTH EVERY DAY, Disp: 90 capsule, Rfl: 4 .  valACYclovir (VALTREX) 500 MG tablet, Take 1 tablet (500 mg total) by mouth daily. Can increase to twice a day for 5 days in the event of a recurrence, Disp: 30 tablet, Rfl: 12 .  Vitamin D, Ergocalciferol, (DRISDOL) 1.25 MG (50000 UT) CAPS capsule, Take 1 capsule (50,000 Units total) by mouth 2 (two) times a week., Disp: 30 capsule, Rfl: 1  Review of Systems  Constitutional: Negative.   Respiratory: Negative.   Cardiovascular: Negative.   Endocrine: Negative.  Neurological: Negative.   Psychiatric/Behavioral: Negative.     Social History   Tobacco Use  . Smoking status: Former Smoker    Years: 10.00    Last attempt to quit: 10/11/2004    Years since quitting: 14.1  . Smokeless tobacco: Never Used  Substance Use Topics  . Alcohol use: Yes    Alcohol/week: 1.0 standard drinks    Types: 1 Standard drinks or equivalent per week    Comment: Saturdays      Objective:   BP 122/82 (BP Location: Left Arm, Patient  Position: Sitting, Cuff Size: Normal)   Pulse 90   Temp 97.8 F (36.6 C) (Oral)   Resp 16   Wt 287 lb (130.2 kg)   BMI 54.23 kg/m  Vitals:   12/15/18 0911  BP: 122/82  Pulse: 90  Resp: 16  Temp: 97.8 F (36.6 C)  TempSrc: Oral  Weight: 287 lb (130.2 kg)     Physical Exam Vitals signs reviewed.  Constitutional:      General: She is not in acute distress.    Appearance: She is well-developed. She is not diaphoretic.  Neck:     Musculoskeletal: Normal range of motion and neck supple.     Thyroid: No thyromegaly.     Vascular: No JVD.     Trachea: No tracheal deviation.  Cardiovascular:     Rate and Rhythm: Normal rate and regular rhythm.     Heart sounds: Normal heart sounds. No murmur. No friction rub. No gallop.   Pulmonary:     Effort: Pulmonary effort is normal. No respiratory distress.     Breath sounds: Normal breath sounds. No wheezing or rales.  Psychiatric:        Mood and Affect: Mood normal.        Behavior: Behavior normal.        Thought Content: Thought content normal.        Judgment: Judgment normal.    Diabetic Foot Exam - Simple   Simple Foot Form Diabetic Foot exam was performed with the following findings:  Yes 12/15/2018  4:35 PM  Visual Inspection No deformities, no ulcerations, no other skin breakdown bilaterally:  Yes Sensation Testing Intact to touch and monofilament testing bilaterally:  Yes Pulse Check Posterior Tibialis and Dorsalis pulse intact bilaterally:  Yes Comments         Assessment & Plan    1. Type 2 diabetes mellitus with hyperglycemia, without long-term current use of insulin (HCC) A1c improved from 8.0 to 7.1. Patient is doing keto and intermittent fasting. Also continues to take Metformin XR 500mg . Scheduled for bariatric surgery in May.  - POCT glycosylated hemoglobin (Hb A1C)  2. Depression, major, single episode, moderate (HCC) Much improved. Continue counseling and citalopram 40mg .   3. Intrinsic eczema Two  small lesions noted on the chest that are pruritic. Will try triamcinolone as below. Call if worsening. - triamcinolone cream (KENALOG) 0.1 %; Apply 1 application topically 2 (two) times daily.  Dispense: 30 g; Refill: 0     Margaretann Loveless, PA-C  [facility]

## 2019-01-05 ENCOUNTER — Encounter: Payer: BLUE CROSS/BLUE SHIELD | Attending: General Surgery | Admitting: Dietician

## 2019-01-05 ENCOUNTER — Other Ambulatory Visit: Payer: Self-pay

## 2019-01-05 ENCOUNTER — Encounter: Payer: Self-pay | Admitting: Dietician

## 2019-01-05 VITALS — Ht 61.0 in | Wt 295.5 lb

## 2019-01-05 DIAGNOSIS — Z6841 Body Mass Index (BMI) 40.0 and over, adult: Secondary | ICD-10-CM | POA: Diagnosis not present

## 2019-01-05 NOTE — Patient Instructions (Signed)
   Continue with current eating pattern -- lean protein foods and plenty of low-carb veggies.   Keep carb intake to 15g or less with each meal/ snack.  Continue to include exercise on a regular basis, great job of creatively including fun ways to be active.   Take a time out to de-stress before eating if needed. Remember that guilty feelings/ thoughts tend to escalate quickly into more unhealthy eating.

## 2019-01-05 NOTE — Progress Notes (Signed)
Appt start time: 0900 end time:  0930.  Assessment:   #6 SWL Appointment.   Start Wt at NDES: 294.7lbs Wt: 295.5lbs Ht: 5'1" BMI: 55.83  Preferred Learning Style:   Hands on   Learning Readiness:   Change in progress  Medications: citalopram, cyclobenzaprine prn, diazepam prn, hydrochlorothiazide, metFORMIN, omeprazole, valACYclovir, Vitamin D  Progress:   Patient reports some increased stress and snacking in recent weeks due to schedule/ lifestyle changes with coronavirus restrictions, and working on marital issues with husband (currently separated).   She has worked to reduce caffeine intake.  She has increased physical exercise in the past several weeks.   Patient has joined Building services engineer.   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               Nutritional Diagnosis:  [place]-3.3 Overweight/obesity related to history of excess calories and physical inactivity as evidenced by patient current BMI of 55.8 following dietary guidelines for continued weight loss prior to bariatric surgery.              Intervention:   . Nutrition counseling for weight loss prior to upcoming bariatric surgery. . Discussed dealing with stress eating and guilt feelings when straying from healthy eating.  . Reviewed importance of calorie control vs only avoiding carbohydrate calories  (and overeating protein/ fat calories). . Patient continues to make positive changes and is motivated to keep working on weight loss before surgery.    Teaching Method Utilized:  Visual Auditory Hands on  Handouts given during visit include:  Goals and instructions   Barriers to learning/adherence to lifestyle change: none  Demonstrated degree of understanding via:  Teach Back   Monitoring/Evaluation:  Dietary intake, exercise, and body weight pre-op class, tentatively scheduled for 01/26/19.

## 2019-01-09 ENCOUNTER — Ambulatory Visit (INDEPENDENT_AMBULATORY_CARE_PROVIDER_SITE_OTHER): Payer: BLUE CROSS/BLUE SHIELD | Admitting: Psychology

## 2019-01-09 DIAGNOSIS — F509 Eating disorder, unspecified: Secondary | ICD-10-CM | POA: Diagnosis not present

## 2019-01-12 ENCOUNTER — Telehealth: Payer: Self-pay | Admitting: Obstetrics and Gynecology

## 2019-01-12 ENCOUNTER — Telehealth: Payer: Self-pay | Admitting: *Deleted

## 2019-01-12 ENCOUNTER — Ambulatory Visit (INDEPENDENT_AMBULATORY_CARE_PROVIDER_SITE_OTHER): Payer: BLUE CROSS/BLUE SHIELD | Admitting: Certified Nurse Midwife

## 2019-01-12 ENCOUNTER — Other Ambulatory Visit: Payer: Self-pay

## 2019-01-12 VITALS — BP 134/95 | HR 87 | Ht 61.0 in | Wt 290.4 lb

## 2019-01-12 DIAGNOSIS — T192XXA Foreign body in vulva and vagina, initial encounter: Secondary | ICD-10-CM

## 2019-01-12 DIAGNOSIS — Z0389 Encounter for observation for other suspected diseases and conditions ruled out: Secondary | ICD-10-CM | POA: Diagnosis not present

## 2019-01-12 NOTE — Telephone Encounter (Signed)
Coronavirus (COVID-19) Are you at risk?  Are you at risk for the Coronavirus (COVID-19)?  To be considered HIGH RISK for Coronavirus (COVID-19), you have to meet the following criteria:  . Traveled to [place], [place], [place], [place] or [place]; or in the [place] to [place], [place], [place], or [place]; and have fever, cough, and shortness of breath within the last 2 weeks of travel OR . Been in close contact with a person diagnosed with COVID-19 within the last 2 weeks and have fever, cough, and shortness of breath . IF YOU DO NOT MEET THESE CRITERIA, YOU ARE CONSIDERED LOW RISK FOR COVID-19.  What to do if you are HIGH RISK for COVID-19?  . If you are having a medical emergency, call 911. . Seek medical care right away. Before you go to a doctor's office, urgent care or emergency department, call ahead and tell them about your recent travel, contact with someone diagnosed with COVID-19, and your symptoms. You should receive instructions from your physician's office regarding next steps of care.  . When you arrive at healthcare provider, tell the healthcare staff immediately you have returned from visiting [place], [place], [place], [place] or [place]; or traveled in the [place] to [place], [place], [place], or [place]; in the last two weeks or you have been in close contact with a person diagnosed with COVID-19 in the last 2 weeks.   . Tell the health care staff about your symptoms: fever, cough and shortness of breath. . After you have been seen by a medical provider, you will be either: o Tested for (COVID-19) and discharged home on quarantine except to seek medical care if symptoms worsen, and asked to  - Stay home and avoid contact with others until you get your results (4-5 days)  - Avoid travel on public transportation if possible (such as bus, train, or airplane) or o Sent to the Emergency Department by EMS for evaluation, COVID-19 testing, and possible  admission depending on your condition and test results.  What to do if you are LOW RISK for COVID-19?  Reduce your risk of any infection by using the same precautions used for avoiding the common cold or flu:  . Wash your hands often with soap and warm water for at least 20 seconds.  If soap and water are not readily available, use an alcohol-based hand sanitizer with at least 60% alcohol.  . If coughing or sneezing, cover your mouth and nose by coughing or sneezing into the elbow areas of your shirt or coat, into a tissue or into your sleeve (not your hands). . Avoid shaking hands with others and consider head nods or verbal greetings only. . Avoid touching your eyes, nose, or mouth with unwashed hands.  . Avoid close contact with people who are sick. . Avoid places or events with large numbers of people in one location, like concerts or sporting events. . Carefully consider travel plans you have or are making. . If you are planning any travel outside or inside the [place], visit the CDC's Travelers' Health webpage for the latest health notices. . If you have some symptoms but not all symptoms, continue to monitor at home and seek medical attention if your symptoms worsen. . If you are having a medical emergency, call 911.   ADDITIONAL HEALTHCARE OPTIONS FOR PATIENTS  [place] [facility] / e-Visit: [email or web address]         [facility]: [phone]  [facility]: [phone]                   [facility]: [phone]   Spoke with pt denies any sx.  Arlander Gillen, CMA 

## 2019-01-12 NOTE — Telephone Encounter (Signed)
appt made

## 2019-01-12 NOTE — Telephone Encounter (Signed)
The patient called and stated she needs an appointment due to her tampon being turned around and she is unable to get it out now. Please advise.

## 2019-01-12 NOTE — Progress Notes (Signed)
Julia Lynch is a 43 y.o. year old G33P0 Caucasian female who presents for removal of retained tampon. Reports placing tampon in vagina after shower last night, but unable to remove this morning after several attempts.   BP (!) 134/95   Pulse 87   Ht 5\' 1"  (1.549 m)   Wt 290 lb 7 oz (131.7 kg)   LMP 01/09/2019 (Exact Date)   BMI 54.88 kg/m   Verbal consent was obtained for removal. Time out was performed.  A large plastic speculum was placed in the vagina.  The cervix was visualized. Blood was noted in the vagina, but no signs of tampon.   Speculum was removed and bimanual exam preformed. Cervix palpable. No foreign object present.   Encouraged routine health maintenance.   Reviewed red flag symptoms and when to call.    Gunnar Bulla, CNM [facility], [facility] 01/12/19 2:18 PM

## 2019-01-15 ENCOUNTER — Encounter (HOSPITAL_COMMUNITY): Payer: Self-pay

## 2019-01-15 ENCOUNTER — Other Ambulatory Visit (HOSPITAL_COMMUNITY): Payer: BLUE CROSS/BLUE SHIELD

## 2019-01-15 ENCOUNTER — Ambulatory Visit (HOSPITAL_COMMUNITY): Payer: BLUE CROSS/BLUE SHIELD

## 2019-01-25 ENCOUNTER — Encounter: Payer: Self-pay | Admitting: Physician Assistant

## 2019-01-25 DIAGNOSIS — G44209 Tension-type headache, unspecified, not intractable: Secondary | ICD-10-CM

## 2019-01-26 ENCOUNTER — Ambulatory Visit: Payer: Self-pay

## 2019-01-26 MED ORDER — BACLOFEN 10 MG PO TABS: 10.0000 mg | ORAL_TABLET | Freq: Three times a day (TID) | ORAL | 5 refills | Status: DC

## 2019-02-01 ENCOUNTER — Encounter: Payer: Self-pay | Admitting: Physician Assistant

## 2019-02-23 ENCOUNTER — Ambulatory Visit: Payer: Self-pay

## 2019-03-20 ENCOUNTER — Telehealth: Payer: Self-pay

## 2019-03-20 NOTE — Telephone Encounter (Signed)
LMTCB for Depression screening patient is also due for Depression follow-up. Please offer appt. E-visit or office.

## 2019-03-30 ENCOUNTER — Other Ambulatory Visit: Payer: Self-pay

## 2019-03-30 ENCOUNTER — Encounter: Payer: BC Managed Care – PPO | Attending: General Surgery | Admitting: Dietician

## 2019-03-30 DIAGNOSIS — Z6841 Body Mass Index (BMI) 40.0 and over, adult: Secondary | ICD-10-CM

## 2019-03-30 NOTE — Progress Notes (Signed)
Pre-Operative Nutrition Class:  Appt start time: 0900   End time:  1030.  Patient was seen on 03/30/19 for Pre-Operative Bariatric Surgery Education at [facility].   Surgery date: TBD Surgery type: Sleeve Gastrectomy Start weight at [facility]: 294.7lbs Weight today: 287.6lbs  InBody  BODY COMP RESULTS    BMI (kg/m^2) 54.4  Fat Mass (lbs) 160.9  Fat Free Mass (lbs) 32.  Total Body Water (lbs) 93.9   Samples given per MNT protocol. Patient educated on appropriate usage: Celebrate Vitamins Multivitamin Lot # [id number]  Exp: 12/2019  Celebrate Vitamins Calcium Citrate Lot # [id number]  Exp: 09/2019;  Lot# [id number]  Exp: 07/2019  Celebrate Iron Soft Chews Lot# [id number] Exp: 10/2019  Renee Pain Protein Powder Lot # [id number]  Exp: 12/2019; Lot# [id number]  Exp: 12/2019;  Lot# [id number]  Exp: 12/2019  Unjury Protein Shake Lot# [id number]  Exp: 04/22/19   The following the learning objectives were met by the patient during this course:  Identify Pre-Op Dietary Goals and will begin 2 weeks pre-operatively  Identify appropriate sources of fluids and proteins   State protein recommendations and appropriate sources pre and post-operatively  Identify Post-Operative Dietary Goals and will follow for 2 weeks post-operatively  Identify appropriate multivitamin and calcium sources  Describe the need for physical activity post-operatively and will follow MD recommendations  State when to call healthcare provider regarding medication questions or post-operative complications  Handouts given during class include:  Pre-Op Bariatric Surgery Diet Handout  Protein Shake Handout  Post-Op Bariatric Surgery Nutrition Handout  [facility]EL[facility] Program Information Flyer  Support Group Information Flyer  [facility] Bariatric Supplements Price List  Follow-Up Plan: Patient will follow-up at [facility], at about 2 weeks post operatively for diet advancement per MD.

## 2019-04-02 ENCOUNTER — Other Ambulatory Visit: Payer: Self-pay | Admitting: General Surgery

## 2019-04-02 ENCOUNTER — Other Ambulatory Visit (HOSPITAL_COMMUNITY): Payer: Self-pay | Admitting: General Surgery

## 2019-04-03 ENCOUNTER — Other Ambulatory Visit: Payer: Self-pay | Admitting: General Surgery

## 2019-04-04 ENCOUNTER — Other Ambulatory Visit: Payer: Self-pay

## 2019-04-04 ENCOUNTER — Ambulatory Visit
Admission: RE | Admit: 2019-04-04 | Discharge: 2019-04-04 | Disposition: A | Discharge status: Home / Self Care | Payer: BC Managed Care – PPO | Source: Ambulatory Visit | Attending: General Surgery | Admitting: General Surgery

## 2019-04-11 ENCOUNTER — Ambulatory Visit (INDEPENDENT_AMBULATORY_CARE_PROVIDER_SITE_OTHER): Payer: Self-pay | Admitting: Psychology

## 2019-04-11 DIAGNOSIS — F509 Eating disorder, unspecified: Secondary | ICD-10-CM

## 2019-04-12 ENCOUNTER — Ambulatory Visit (HOSPITAL_COMMUNITY): Payer: BC Managed Care – PPO

## 2019-06-02 ENCOUNTER — Other Ambulatory Visit: Payer: Self-pay | Admitting: Obstetrics and Gynecology

## 2019-06-06 ENCOUNTER — Other Ambulatory Visit: Payer: Self-pay | Admitting: Physician Assistant

## 2019-06-06 DIAGNOSIS — E1165 Type 2 diabetes mellitus with hyperglycemia: Secondary | ICD-10-CM

## 2019-06-12 ENCOUNTER — Encounter: Payer: Self-pay | Admitting: Physician Assistant

## 2019-07-10 NOTE — Progress Notes (Deleted)
Patient: Julia Lynch Female    DOB: 03/07/1976   43 y.o.   MRN: [id number] Visit Date: 07/10/2019  Today's Provider: Margaretann Loveless, PA-C   No chief complaint on file.  Subjective:     Virtual Visit via Video Note  I connected with Julia Lynch on 07/10/19 at  8:40 AM EDT by a video enabled telemedicine application and verified that I am speaking with the correct person using two identifiers.  Location: Patient: *** Provider: ***   I discussed the limitations of evaluation and management by telemedicine and the availability of in person appointments. The patient expressed understanding and agreed to proceed.   HPI  Patient with c/o worsening depression.She feels so tired,exhausted, overwhelmed,  and sad.She have gotten to where some days she cannot even get out of bed and I have so much tension and pressure in her neck and shoulders that she has headaches daily.  Allergies  Allergen Reactions  . Ibuprofen     Other reaction(s): Other (See Comments) GI Upset  . Lodine [Etodolac]      Current Outpatient Medications:  .  baclofen (LIORESAL) 10 MG tablet, Take 1 tablet (10 mg total) by mouth 3 (three) times daily., Disp: 30 each, Rfl: 5 .  citalopram (CELEXA) 40 MG tablet, , Disp: , Rfl:  .  cyclobenzaprine (FLEXERIL) 5 MG tablet, Take 1 tablet (5 mg total) by mouth 3 (three) times daily as needed for muscle spasms., Disp: 30 tablet, Rfl: 1 .  diazepam (VALIUM) 5 MG tablet, TAKE AS NEEDED FOR HEADACHES, NO MORE THAN 3 IN A DAY, Disp: 30 tablet, Rfl: 5 .  hydrochlorothiazide (HYDRODIURIL) 25 MG tablet, TAKE 1 TABLET BY MOUTH EVERY DAY, Disp: 90 tablet, Rfl: 3 .  metFORMIN (GLUCOPHAGE-XR) 500 MG 24 hr tablet, TAKE 2 TABLETS BY MOUTH EVERY DAY WITH BREAKFAST, Disp: 60 tablet, Rfl: 5 .  omeprazole (PRILOSEC) 20 MG capsule, TAKE 1 CAPSULE BY MOUTH EVERY DAY, Disp: 90 capsule, Rfl: 4 .  triamcinolone cream (KENALOG) 0.1 %, Apply 1 application topically 2  (two) times daily., Disp: 30 g, Rfl: 0 .  valACYclovir (VALTREX) 500 MG tablet, Take 1 tablet (500 mg total) by mouth daily. Can increase to twice a day for 5 days in the event of a recurrence, Disp: 30 tablet, Rfl: 12 .  Vitamin D, Ergocalciferol, (DRISDOL) 1.25 MG (50000 UT) CAPS capsule, TAKE 1 CAPSULE (50,000 UNITS TOTAL) BY MOUTH 2 (TWO) TIMES A WEEK., Disp: 8 capsule, Rfl: 7  Review of Systems  Social History   Tobacco Use  . Smoking status: Former Smoker    Years: 10.00    Quit date: 10/11/2004    Years since quitting: 14.7  . Smokeless tobacco: Never Used  Substance Use Topics  . Alcohol use: Yes    Alcohol/week: 1.0 standard drinks    Types: 1 Standard drinks or equivalent per week    Comment: Saturdays      Objective:   There were no vitals taken for this visit. There were no vitals filed for this visit.There is no height or weight on file to calculate BMI.   Physical Exam   No results found for any visits on 07/11/19.     Assessment & Plan    I discussed the assessment and treatment plan with the patient. The patient was provided an opportunity to ask questions and all were answered. The patient agreed with the plan and demonstrated an understanding of the instructions.  The patient was advised to call back or seek an in-person evaluation if the symptoms worsen or if the condition fails to improve as anticipated.  I provided *** minutes of non-face-to-face time during this encounter.    Mar Daring, PA-C  [facility]

## 2019-07-11 ENCOUNTER — Ambulatory Visit: Payer: Self-pay | Admitting: Physician Assistant

## 2019-07-18 ENCOUNTER — Telehealth (INDEPENDENT_AMBULATORY_CARE_PROVIDER_SITE_OTHER): Payer: BC Managed Care – PPO | Admitting: Physician Assistant

## 2019-07-18 ENCOUNTER — Encounter: Payer: Self-pay | Admitting: Physician Assistant

## 2019-07-18 VITALS — BP 136/84 | HR 87 | Wt 293.2 lb

## 2019-07-18 DIAGNOSIS — E1165 Type 2 diabetes mellitus with hyperglycemia: Secondary | ICD-10-CM | POA: Diagnosis not present

## 2019-07-18 DIAGNOSIS — I10 Essential (primary) hypertension: Secondary | ICD-10-CM | POA: Diagnosis not present

## 2019-07-18 DIAGNOSIS — Z6841 Body Mass Index (BMI) 40.0 and over, adult: Secondary | ICD-10-CM

## 2019-07-18 DIAGNOSIS — F339 Major depressive disorder, recurrent, unspecified: Secondary | ICD-10-CM | POA: Diagnosis not present

## 2019-07-18 DIAGNOSIS — E66813 Obesity, class 3: Secondary | ICD-10-CM

## 2019-07-18 NOTE — Progress Notes (Signed)
Patient: Julia Lynch Female    DOB: [birth year]   43 y.o.   MRN: [id number] Visit Date: 07/18/2019  Today's Provider: Margaretann Loveless, PA-C   No chief complaint on file.  Subjective:    Virtual Visit via Video Note  I connected with AJLA MCGEACHY on 07/18/19 at  9:40 AM EDT by a video enabled telemedicine application and verified that I am speaking with the correct person using two identifiers.  Location: Patient: home Provider: home office  I discussed the limitations of evaluation and management by telemedicine and the availability of in person appointments. The patient expressed understanding and agreed to proceed.   HPI  Jori "Hilda Lias" Vogan is a 43 yr old female that presents today to f/u her HTN, diabetes and depression.   HTN: She reports it has been doing well. Runs normally in the 120-130s/80s. She is compliant with her medications and has no side effects. Denies chest pain, sob, doe, or visual disturbances.  T2DM: Last A1c was 7.1 in march. She reports home fasting sugars average around 150. She is trying to eat healthy and just started back walking 30 minutes per day (broken up into 2 15 minute increments) last week.   Depression: Reports doing "ok". 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 She does go to counseling. She feels she is doing stable and is working on her coping mechanisms. Feels citalopram 40mg  is working well.   Allergies  Allergen Reactions  . Ibuprofen     Other reaction(s): Other (See Comments) GI Upset  . Lodine [Etodolac]      Current Outpatient Medications:  .  baclofen (LIORESAL) 10 MG tablet, Take 1 tablet (10 mg total) by mouth 3 (three) times daily., Disp: 30 each, Rfl: 5 .  citalopram (CELEXA) 40 MG tablet, , Disp: , Rfl:  .  cyclobenzaprine (FLEXERIL) 5 MG tablet, Take 1 tablet (5 mg total) by mouth 3 (three) times daily as needed  for muscle spasms., Disp: 30 tablet, Rfl: 1 .  diazepam (VALIUM) 5 MG tablet, TAKE AS NEEDED FOR HEADACHES, NO MORE THAN 3 IN A DAY, Disp: 30 tablet, Rfl: 5 .  hydrochlorothiazide (HYDRODIURIL) 25 MG tablet, TAKE 1 TABLET BY MOUTH EVERY DAY, Disp: 90 tablet, Rfl: 3 .  metFORMIN (GLUCOPHAGE-XR) 500 MG 24 hr tablet, TAKE 2 TABLETS BY MOUTH EVERY DAY WITH BREAKFAST, Disp: 60 tablet, Rfl: 5 .  omeprazole (PRILOSEC) 20 MG capsule, TAKE 1 CAPSULE BY MOUTH EVERY DAY, Disp: 90 capsule, Rfl: 4 .  triamcinolone cream (KENALOG) 0.1 %, Apply 1 application topically 2 (two) times daily., Disp: 30 g, Rfl: 0 .  valACYclovir (VALTREX) 500 MG tablet, Take 1 tablet (500 mg total) by mouth daily. Can increase to twice a day for 5 days in the event of a recurrence, Disp: 30 tablet, Rfl: 12 .  Vitamin D, Ergocalciferol, (DRISDOL) 1.25 MG (50000 UT) CAPS capsule, TAKE 1 CAPSULE (50,000 UNITS TOTAL) BY MOUTH 2 (TWO) TIMES A WEEK., Disp: 8 capsule, Rfl: 7  Review of Systems  Constitutional: Negative for appetite change, chills, fatigue and fever.  Respiratory: Negative for chest tightness and shortness of breath.   Cardiovascular: Negative for chest pain and palpitations.  Gastrointestinal: Negative for abdominal pain, nausea and vomiting.  Neurological: Negative for dizziness and weakness.  Psychiatric/Behavioral: Positive for dysphoric mood. Negative for agitation, self-injury, sleep disturbance and suicidal ideas. The patient is nervous/anxious.  Social History   Tobacco Use  . Smoking status: Former Smoker    Years: 10.00    Quit date: 10/11/2004    Years since quitting: 14.7  . Smokeless tobacco: Never Used  Substance Use Topics  . Alcohol use: Yes    Alcohol/week: 1.0 standard drinks    Types: 1 Standard drinks or equivalent per week    Comment: Saturdays      Objective:   BP 136/84 (BP Location: Left Wrist, Patient Position: Sitting, Cuff Size: Normal)   Pulse 87   Wt 293 lb 3.2 oz (133 kg)    BMI 55.40 kg/m  Vitals:   07/18/19 0955  BP: 136/84  Pulse: 87  Weight: 293 lb 3.2 oz (133 kg)  Body mass index is 55.4 kg/m.   Physical Exam Vitals signs reviewed.  Constitutional:      General: She is not in acute distress.    Appearance: Normal appearance. She is well-developed. She is obese. She is not ill-appearing.  HENT:     Head: Normocephalic and atraumatic.  Neck:     Musculoskeletal: Normal range of motion and neck supple.  Pulmonary:     Effort: Pulmonary effort is normal. No respiratory distress.  Neurological:     Mental Status: She is alert.  Psychiatric:        Mood and Affect: Mood normal.        Behavior: Behavior normal.        Thought Content: Thought content normal.        Judgment: Judgment normal.     Depression screen [facility] 2/9 07/18/2019 12/15/2018 08/02/2018 02/24/2018 07/06/2017  Decreased Interest 1 1 0 3 2  Down, Depressed, Hopeless 1 1 0 2 2  PHQ - 2 Score 2 2 0 5 4  Altered sleeping 2 2 - 1 3  Tired, decreased energy 3 2 - 3 3  Change in appetite 2 0 - 3 2  Feeling bad or failure about yourself  1 3 - 3 3  Trouble concentrating 0 0 - 3 3  Moving slowly or fidgety/restless 0 0 - 3 2  Suicidal thoughts 0 1 - 0 0  PHQ-9 Score 10 10 - 21 20  Difficult doing work/chores Somewhat difficult Somewhat difficult - Very difficult Extremely dIfficult    No results found for any visits on 07/18/19.     Assessment & Plan    1. Benign essential HTN Stable. Continue HCTZ 25mg . Return in January for labs and CPE.   2. Type 2 diabetes mellitus with hyperglycemia, without long-term current use of insulin (HCC) Stable. Continue Metformin XR 500mg  (take 2 tabs 1000mg ) daily.  3. Depression, recurrent (HCC) Stable. Continue counseling and citalopram 40mg . Call if worsening.   4. Class 3 severe obesity due to excess calories with serious comorbidity and body mass index (BMI) of 50.0 to 59.9 in adult [facility]) Counseled patient on healthy lifestyle  modifications including dieting and exercise.   5. Morbidly obese ([place]) Counseled patient on healthy lifestyle modifications including dieting and exercise.    I discussed the assessment and treatment plan with the patient. The patient was provided an opportunity to ask questions and all were answered. The patient agreed with the plan and demonstrated an understanding of the instructions.  The patient was advised to call back or seek an in-person evaluation if the symptoms worsen or if the condition fails to improve as anticipated.  I provided 30 minutes of non-face-to-face time during this encounter.  Mar Daring, PA-C  [facility]

## 2019-07-18 NOTE — Patient Instructions (Signed)
10 Relaxation Techniques That Zap Stress Fast By Jeannette Moninger   Listen  Relax. You deserve it, it's good for you, and it takes less time than you think. You don't need a spa weekend or a retreat. Each of these stress-relieving tips can get you from OMG to om in less than 15 minutes. 1. Meditate  A few minutes of practice per day can help ease anxiety. "Research suggests that daily meditation may alter the brain's neural pathways, making you more resilient to stress," says psychologist Robbie Maller Hartman, PhD, a [place] health and wellness coach. It's simple. Sit up straight with both feet on the floor. Close your eyes. Focus your attention on reciting -- out loud or silently -- a positive mantra such as "I feel at peace" or "I love myself." Place one hand on your belly to sync the mantra with your breaths. Let any distracting thoughts float by like clouds. 2. Breathe Deeply  Take a 5-minute break and focus on your breathing. Sit up straight, eyes closed, with a hand on your belly. Slowly inhale through your nose, feeling the breath start in your abdomen and work its way to the top of your head. Reverse the process as you exhale through your mouth.  "Deep breathing counters the effects of stress by slowing the heart rate and lowering blood pressure," psychologist Judith Tutin, PhD, says. She's a certified life coach in [place], [place] 3. Be Present  Slow down.  "Take 5 minutes and focus on only one behavior with awareness," Tutin says. Notice how the air feels on your face when you're walking and how your feet feel hitting the ground. Enjoy the texture and taste of each bite of food. When you spend time in the moment and focus on your senses, you should feel less tense. 4. Reach Out  Your social network is one of your best tools for handling stress. Talk to others -- preferably face to face, or at least on the phone. Share what's going on. You can get a fresh perspective while keeping your  connection strong. 5. Tune In to Your Body  Mentally scan your body to get a sense of how stress affects it each day. Lie on your back, or sit with your feet on the floor. Start at your toes and work your way up to your scalp, noticing how your body feels.  "Simply be aware of places you feel tight or loose without trying to change anything," Tutin says. For 1 to 2 minutes, imagine each deep breath flowing to that body part. Repeat this process as you move your focus up your body, paying close attention to sensations you feel in each body part. 6. Decompress  Place a warm heat wrap around your neck and shoulders for 10 minutes. Close your eyes and relax your face, neck, upper chest, and back muscles. Remove the wrap, and use a tennis ball or foam roller to massage away tension.  "Place the ball between your back and the wall. Lean into the ball, and hold gentle pressure for up to 15 seconds. Then move the ball to another spot, and apply pressure," says Cathy Benninger, a nurse practitioner and assistant professor at [employer] [facility] in [place]. 7. Laugh Out Loud  A good belly laugh doesn't just lighten the load mentally. It lowers cortisol, your body's stress hormone, and boosts brain chemicals called endorphins, which help your mood. Lighten up by tuning in to your favorite sitcom or video, reading   the comics, or chatting with someone who makes you smile. 8. Crank Up the Tunes  Research shows that listening to soothing music can lower blood pressure, heart rate, and anxiety. "Create a playlist of songs or nature sounds (the ocean, a bubbling brook, birds chirping), and allow your mind to focus on the different melodies, instruments, or singers in the piece," Benninger says. You also can blow off steam by rocking out to more upbeat tunes -- or singing at the top of your lungs! 9. Get Moving  You don't have to run in order to get a runner's high. All forms of exercise,  including yoga and walking, can ease depression and anxiety by helping the brain release feel-good chemicals and by giving your body a chance to practice dealing with stress. You can go for a quick walk around the block, take the stairs up and down a few flights, or do some stretching exercises like head rolls and shoulder shrugs. 10. Be Grateful  Keep a gratitude journal or several (one by your bed, one in your purse, and one at work) to help you remember all the things that are good in your life.  "Being grateful for your blessings cancels out negative thoughts and worries," says Joni Emmerling, a wellness coach in [place], [place].  Use these journals to savor good experiences like a child's smile, a sunshine-filled day, and good health. Don't forget to celebrate accomplishments like mastering a new task at work or a new hobby. When you start feeling stressed, spend a few minutes looking through your notes to remind yourself what really matters.   

## 2019-07-22 ENCOUNTER — Other Ambulatory Visit: Payer: Self-pay | Admitting: Physician Assistant

## 2019-07-22 DIAGNOSIS — R519 Headache, unspecified: Secondary | ICD-10-CM

## 2019-08-18 ENCOUNTER — Other Ambulatory Visit: Payer: Self-pay | Admitting: Physician Assistant

## 2019-08-18 DIAGNOSIS — I10 Essential (primary) hypertension: Secondary | ICD-10-CM

## 2019-09-11 ENCOUNTER — Encounter: Payer: Self-pay | Admitting: Physician Assistant

## 2019-09-12 NOTE — Telephone Encounter (Signed)
Mychart message sent.

## 2019-09-14 ENCOUNTER — Encounter: Payer: Self-pay | Admitting: Physician Assistant

## 2019-10-15 ENCOUNTER — Encounter: Payer: Self-pay | Admitting: Physician Assistant

## 2019-10-15 DIAGNOSIS — Z130 Encounter for screening for diseases of the blood and blood-forming organs and certain disorders involving the immune mechanism: Secondary | ICD-10-CM

## 2019-11-23 ENCOUNTER — Other Ambulatory Visit: Payer: Self-pay | Admitting: Family Medicine

## 2019-12-24 ENCOUNTER — Encounter: Payer: Self-pay | Admitting: Physician Assistant

## 2019-12-24 ENCOUNTER — Telehealth (INDEPENDENT_AMBULATORY_CARE_PROVIDER_SITE_OTHER): Payer: BC Managed Care – PPO | Admitting: Physician Assistant

## 2019-12-24 VITALS — BP 165/99 | HR 96 | Wt 289.2 lb

## 2019-12-24 DIAGNOSIS — K580 Irritable bowel syndrome with diarrhea: Secondary | ICD-10-CM

## 2019-12-24 DIAGNOSIS — E282 Polycystic ovarian syndrome: Secondary | ICD-10-CM

## 2019-12-24 DIAGNOSIS — N920 Excessive and frequent menstruation with regular cycle: Secondary | ICD-10-CM | POA: Diagnosis not present

## 2019-12-24 MED ORDER — DIPHENOXYLATE-ATROPINE 2.5-0.025 MG PO TABS: 1.0000 | ORAL_TABLET | Freq: Four times a day (QID) | ORAL | 1 refills | Status: DC

## 2019-12-24 NOTE — Progress Notes (Signed)
Patient: Julia Lynch Female    DOB: Jan 10, 1976   44 y.o.   MRN: [id number] Visit Date: 12/24/2019  Today's Provider: Margaretann Loveless, PA-C   Chief Complaint  Patient presents with  . Abdominal Pain   Subjective:     Virtual Visit via Video Note  I connected with Julia Lynch on 12/24/19 at  4:00 PM EDT by a video enabled telemedicine application and verified that I am speaking with the correct person using two identifiers.  Location: Patient: Home Provider: [facility]   I discussed the limitations of evaluation and management by telemedicine and the availability of in person appointments. The patient expressed understanding and agreed to proceed.  Abdominal Pain This is a new problem. The current episode started more than 1 month ago (for a month with her stomach pain but in the last week worsening in the lower abdomen and the side on her back sharp pains). The problem has been gradually worsening. The pain is located in the left flank and right flank. The quality of the pain is aching and sharp. Associated symptoms include diarrhea ("watery") and nausea. Pertinent negatives include no headaches or vomiting. Nothing aggravates the pain. The pain is relieved by nothing. Treatments tried: cutting sugars/carbs.  February 24th is when the diarrhea started worsening. Had severe cramping, felt like labor pains, during the bowel movement. Has sharp, shooting pains, sometimes cramping in the lower abdomen. Radiates up to the flank bilaterally. Anytime she eats she has diarrhea for about an hour after. Will avoid eating to not have a BM.   Also has very heavy menstrual cycles. Reports the first two days are so heavy she leaks down her legs even with the heavy absorption pads. She is unable to wear tampons because her menstrual cycles are so heavy.   Allergies  Allergen Reactions  . Ibuprofen     Other reaction(s): Other (See Comments) GI Upset  . Lodine [Etodolac]       Current Outpatient Medications:  .  citalopram (CELEXA) 40 MG tablet, TAKE 1 TABLET BY MOUTH EVERY DAY, Disp: 30 tablet, Rfl: 11 .  cyclobenzaprine (FLEXERIL) 5 MG tablet, Take 1 tablet (5 mg total) by mouth 3 (three) times daily as needed for muscle spasms., Disp: 30 tablet, Rfl: 1 .  diazepam (VALIUM) 5 MG tablet, TAKE AS NEEDED FOR HEADACHES, NO MORE THAN 3 IN A DAY, Disp: 30 tablet, Rfl: 5 .  hydrochlorothiazide (HYDRODIURIL) 25 MG tablet, TAKE 1 TABLET BY MOUTH EVERY DAY, Disp: 30 tablet, Rfl: 11 .  metFORMIN (GLUCOPHAGE-XR) 500 MG 24 hr tablet, TAKE 2 TABLETS BY MOUTH EVERY DAY WITH BREAKFAST, Disp: 60 tablet, Rfl: 5 .  omeprazole (PRILOSEC) 20 MG capsule, TAKE 1 CAPSULE BY MOUTH EVERY DAY**NEEDS REFILLS*, Disp: 90 capsule, Rfl: 2 .  triamcinolone cream (KENALOG) 0.1 %, Apply 1 application topically 2 (two) times daily., Disp: 30 g, Rfl: 0 .  valACYclovir (VALTREX) 500 MG tablet, Take 1 tablet (500 mg total) by mouth daily. Can increase to twice a day for 5 days in the event of a recurrence, Disp: 30 tablet, Rfl: 12 .  Vitamin D, Ergocalciferol, (DRISDOL) 1.25 MG (50000 UT) CAPS capsule, TAKE 1 CAPSULE (50,000 UNITS TOTAL) BY MOUTH 2 (TWO) TIMES A WEEK., Disp: 8 capsule, Rfl: 7 .  baclofen (LIORESAL) 10 MG tablet, Take 1 tablet (10 mg total) by mouth 3 (three) times daily. (Patient not taking: Reported on 12/24/2019), Disp: 30 each, Rfl: 5  Review  of Systems  Constitutional: Negative.   Respiratory: Negative.   Cardiovascular: Negative.   Gastrointestinal: Positive for abdominal pain, diarrhea ("watery") and nausea. Negative for vomiting.  Genitourinary: Positive for menstrual problem.  Neurological: Negative for dizziness and headaches.    Social History   Tobacco Use  . Smoking status: Former Smoker    Years: 10.00    Quit date: 10/11/2004    Years since quitting: 15.2  . Smokeless tobacco: Never Used  Substance Use Topics  . Alcohol use: Yes    Alcohol/week: 1.0 standard  drinks    Types: 1 Standard drinks or equivalent per week    Comment: Saturdays      Objective:   BP (!) 165/99 (BP Location: Left Arm, Patient Position: Sitting, Cuff Size: Large) Comment: No meds yet  Pulse 96   Wt 289 lb 3.2 oz (131.2 kg)   BMI 54.64 kg/m  Vitals:   12/24/19 1518  BP: (!) 165/99  Pulse: 96  Weight: 289 lb 3.2 oz (131.2 kg)  Body mass index is 54.64 kg/m.   Physical Exam Vitals reviewed.  Constitutional:      General: She is not in acute distress.    Appearance: She is well-developed. She is obese. She is not ill-appearing.  Pulmonary:     Effort: No respiratory distress.  Neurological:     Mental Status: She is alert.      No results found for any visits on 12/24/19.     Assessment & Plan     1. Irritable bowel syndrome with diarrhea Suspect IBS due to symptoms and having for such a long time. Has had for many years but progressed over the last month. Will try Lomotil as below. If not improving will refer to GI. Patient agrees. F/U in 4-6 weeks via mychart about effectiveness and tolerance of medication.  - diphenoxylate-atropine (LOMOTIL) 2.5-0.025 MG tablet; Take 1 tablet by mouth 4 (four) times daily.  Dispense: 120 tablet; Refill: 1  2. Menorrhagia with regular cycle Worsening. Has seen [facility]. Referral placed for yearly follow up and to discuss treatment considerations for her heavy menstrual cycles.  - Ambulatory referral to Gynecology  3. Polycystic ovaries See above medical treatment plan. - Ambulatory referral to Gynecology   I discussed the assessment and treatment plan with the patient. The patient was provided an opportunity to ask questions and all were answered. The patient agreed with the plan and demonstrated an understanding of the instructions.   The patient was advised to call back or seek an in-person evaluation if the symptoms worsen or if the condition fails to improve as anticipated.  I provided 24 minutes of  non-face-to-face time during this encounter.    Mar Daring, PA-C  [facility]

## 2019-12-24 NOTE — Patient Instructions (Signed)
Irritable Bowel Syndrome, Adult  Irritable bowel syndrome (IBS) is a group of symptoms that affects the organs responsible for digestion (gastrointestinal or GI tract). IBS is not one specific disease. To regulate how the GI tract works, the body sends signals back and forth between the intestines and the brain. If you have IBS, there may be a problem with these signals. As a result, the GI tract does not function normally. The intestines may become more sensitive and overreact to certain things. This may be especially true when you eat certain foods or when you are under stress. There are four types of IBS. These may be determined based on the consistency of your stool (feces):  IBS with diarrhea.  IBS with constipation.  Mixed IBS.  Unsubtyped IBS. It is important to know which type of IBS you have. Certain treatments are more likely to be helpful for certain types of IBS. What are the causes? The exact cause of IBS is not known. What increases the risk? You may have a higher risk for IBS if you:  Are female.  Are younger than 40.  Have a family history of IBS.  Have a mental health condition, such as depression, anxiety, or post-traumatic stress disorder.  Have had a bacterial infection of your GI tract. What are the signs or symptoms? Symptoms of IBS vary from person to person. The main symptom is abdominal pain or discomfort. Other symptoms usually include one or more of the following:  Diarrhea, constipation, or both.  Abdominal swelling or bloating.  Feeling full after eating a small or regular-sized meal.  Frequent gas.  Mucus in the stool.  A feeling of having more stool left after a bowel movement. Symptoms tend to come and go. They may be triggered by stress, mental health conditions, or certain foods. How is this diagnosed? This condition may be diagnosed based on a physical exam, your medical history, and your symptoms. You may have tests, such as:  Blood  tests.  Stool test.  X-rays.  CT scan.  Colonoscopy. This is a procedure in which your GI tract is viewed with a long, thin, flexible tube. How is this treated? There is no cure for IBS, but treatment can help relieve symptoms. Treatment depends on the type of IBS you have, and may include:  Changes to your diet, such as: ? Avoiding foods that cause symptoms. ? Drinking more water. ? Following a low-FODMAP (fermentable oligosaccharides, disaccharides, monosaccharides, and polyols) diet for up to 6 weeks, or as told by your health care provider. FODMAPs are sugars that are hard for some people to digest. ? Eating more fiber. ? Eating medium-sized meals at the same times every day.  Medicines. These may include: ? Fiber supplements, if you have constipation. ? Medicine to control diarrhea (antidiarrheal medicines). ? Medicine to help control muscle tightening (spasms) in your GI tract (antispasmodic medicines). ? Medicines to help with mental health conditions, such as antidepressants or tranquilizers.  Talk therapy or counseling.  Working with a diet and nutrition specialist (dietitian) to help create a food plan that is right for you.  Managing your stress. Follow these instructions at home: Eating and drinking  Eat a healthy diet.  Eat medium-sized meals at about the same time every day. Do not eat large meals.  Gradually eat more fiber-rich foods. These include whole grains, fruits, and vegetables. This may be especially helpful if you have IBS with constipation.  Eat a diet low in FODMAPs.  Drink enough   fluid to keep your urine pale yellow.  Keep a journal of foods that seem to trigger symptoms.  Avoid foods and drinks that: ? Contain added sugar. ? Make your symptoms worse. Dairy products, caffeinated drinks, and carbonated drinks can make symptoms worse for some people. General instructions  Take over-the-counter and prescription medicines and supplements only  as told by your health care provider.  Get enough exercise. Do at least 150 minutes of moderate-intensity exercise each week.  Manage your stress. Getting enough sleep and exercise can help you manage stress.  Keep all follow-up visits as told by your health care provider and therapist. This is important. Alcohol Use  Do not drink alcohol if: ? Your health care provider tells you not to drink. ? You are pregnant, may be pregnant, or are planning to become pregnant.  If you drink alcohol, limit how much you have: ? 0-1 drink a day for women. ? 0-2 drinks a day for men.  Be aware of how much alcohol is in your drink. In the [place] one drink equals one typical bottle of beer (12 oz), one-half glass of wine (5 oz), or one shot of hard liquor (1 oz). Contact a health care provider if you have:  Constant pain.  Weight loss.  Difficulty or pain when swallowing.  Diarrhea that gets worse. Get help right away if you have:  Severe abdominal pain.  Fever.  Diarrhea with symptoms of dehydration, such as dizziness or dry mouth.  Bright red blood in your stool.  Stool that is black and tarry.  Abdominal swelling.  Vomiting that does not stop.  Blood in your vomit. Summary  Irritable bowel syndrome (IBS) is not one specific disease. It is a group of symptoms that affects digestion.  Your intestines may become more sensitive and overreact to certain things. This may be especially true when you eat certain foods or when you are under stress.  There is no cure for IBS, but treatment can help relieve symptoms. This information is not intended to replace advice given to you by your health care provider. Make sure you discuss any questions you have with your health care provider. Document Revised: 09/20/2017 Document Reviewed: 09/20/2017 Elsevier Patient Education  2020 [place]. Diet for Irritable Bowel Syndrome When you have irritable bowel syndrome (IBS), it is very  important to eat the foods and follow the eating habits that are best for your condition. IBS may cause various symptoms such as pain in the abdomen, constipation, or diarrhea. Choosing the right foods can help to ease the discomfort from these symptoms. Work with your health care provider and diet and nutrition specialist (dietitian) to find the eating plan that will help to control your symptoms. What are tips for following this plan?      Keep a food diary. This will help you identify foods that cause symptoms. Write down: ? What you eat and when you eat it. ? What symptoms you have. ? When symptoms occur in relation to your meals, such as "pain in abdomen 2 hours after dinner."  Eat your meals slowly and in a relaxed setting.  Aim to eat 5-6 small meals per day. Do not skip meals.  Drink enough fluid to keep your urine pale yellow.  Ask your health care provider if you should take an over-the-counter probiotic to help restore healthy bacteria in your gut (digestive tract). ? Probiotics are foods that contain good bacteria and yeasts.  Your dietitian may have specific dietary  recommendations for you based on your symptoms. He or she may recommend that you: ? Avoid foods that cause symptoms. Talk with your dietitian about other ways to get the same nutrients that are in those problem foods. ? Avoid foods with gluten. Gluten is a protein that is found in rye, wheat, and barley. ? Eat more foods that contain soluble fiber. Examples of foods with high soluble fiber include oats, seeds, and certain fruits and vegetables. Take a fiber supplement if directed by your dietitian. ? Reduce or avoid certain foods called FODMAPs. These are foods that contain carbohydrates that are hard to digest. Ask your doctor which foods contain these carbohydrates. What foods are not recommended? The following are some foods and drinks that may make your symptoms worse:  Fatty foods, such as french  fries.  Foods that contain gluten, such as pasta and cereal.  Dairy products, such as milk, cheese, and ice cream.  Chocolate.  Alcohol.  Products with caffeine, such as coffee.  Carbonated drinks, such as soda.  Foods that are high in FODMAPs. These include certain fruits and vegetables.  Products with sweeteners such as honey, high fructose corn syrup, sorbitol, and mannitol. The items listed above may not be a complete list of foods and beverages you should avoid. Contact a dietitian for more information. What foods are good sources of fiber? Your health care provider or dietitian may recommend that you eat more foods that contain fiber. Fiber can help to reduce constipation and other IBS symptoms. Add foods with fiber to your diet a little at a time so your body can get used to them. Too much fiber at one time might cause gas and swelling of your abdomen. The following are some foods that are good sources of fiber:  Berries, such as raspberries, strawberries, and blueberries.  Tomatoes.  Carrots.  Brown rice.  Oats.  Seeds, such as chia and pumpkin seeds. The items listed above may not be a complete list of recommended sources of fiber. Contact your dietitian for more options. Where to find more information  International Foundation for Functional Gastrointestinal Disorders: www.iffgd.[employer] of Diabetes and Digestive and Kidney Diseases: [employer]tn Summary  When you have irritable bowel syndrome (IBS), it is very important to eat the foods and follow the eating habits that are best for your condition.  IBS may cause various symptoms such as pain in the abdomen, constipation, or diarrhea.  Choosing the right foods can help to ease the discomfort that comes from symptoms.  Keep a food diary. This will help you identify foods that cause symptoms.  Your health care provider or diet and nutrition specialist (dietitian) may recommend that you eat  more foods that contain fiber. This information is not intended to replace advice given to you by your health care provider. Make sure you discuss any questions you have with your health care provider. Document Revised: 01/17/2019 Document Reviewed: 05/31/2017 Elsevier Patient Education  2020 Elsevier Inc. Atropine; Diphenoxylate oral liquid What is this medicine? ATROPINE; DIPHENOXYLATE (A troe peen dye fen OX i late) is used to treat diarrhea. This medicine may be used for other purposes; ask your health care provider or pharmacist if you have questions. COMMON BRAND NAME(S): Lomotil What should I tell my health care provider before I take this medicine? They need to know if you have any of these conditions:  bacterial food poisoning  colitis  dehydration  Down's syndrome  jaundice or liver disease  an unusual  or allergic reaction to atropine, diphenoxylate, other medicines, foods, dyes, or preservatives  pregnant or trying to get pregnant  breast-feeding How should I use this medicine? Take this medicine by mouth. Follow the directions on the prescription label. Use a specially marked spoon or dropper to measure your medicine. Ask your pharmacist if you do not have one. Household spoons are not accurate. Take your medicine at regular intervals. Do not take it more often than directed. Once your diarrhea has been brought under control your doctor or health care professional may reduce your doses. Talk to your pediatrician regarding the use of this medicine in children. Special care may be needed. Overdosage: If you think you have taken too much of this medicine contact a poison control center or emergency room at once. NOTE: This medicine is only for you. Do not share this medicine with others. What if I miss a dose? If you miss a dose, take it as soon as you can. If it is almost time for your next dose, take only that dose. Do not take double or extra doses. What may interact  with this medicine?  alcohol  antihistamines for allergy, cough and cold  barbiturate medicines for inducing sleep or treating seizures  certain medicines for depression, anxiety, or psychotic disturbances  certain medicines for sleep  medicines for movement abnormalities as in Parkinson's disease, or for gastrointestinal problems  muscle relaxants  narcotic medicines (opiates) for pain This list may not describe all possible interactions. Give your health care provider a list of all the medicines, herbs, non-prescription drugs, or dietary supplements you use. Also tell them if you smoke, drink alcohol, or use illegal drugs. Some items may interact with your medicine. What should I watch for while using this medicine? If your symptoms do not start to get better after taking this medicine for two days, check with your doctor or health care professional, you may have a problem that needs further evaluation. Check with your doctor or health care professional right away if you develop a fever or bloody diarrhea. You may get drowsy or dizzy. Do not drive, use machinery, or do anything that needs mental alertness until you know how this medicine affects you. Do not stand or sit up quickly, especially if you are an older patient. This reduces the risk of dizzy or fainting spells. Alcohol may interfere with the effect of this medicine. Avoid alcoholic drinks. Your mouth may get dry. Chewing sugarless gum or sucking hard candy, and drinking plenty of water may help. Contact your doctor if the problem does not go away or is severe. Drinking plenty of water can also help prevent dehydration that can occur with diarrhea. What side effects may I notice from receiving this medicine? Side effects that you should report to your doctor or health care professional as soon as possible:  allergic reactions like skin rash, itching or hives, swelling of the face, lips, or tongue  bloated, swollen  feeling  breathing problems  changes in vision  fast, irregular heartbeat  stomach pain Side effects that usually do not require medical attention (report to your doctor or health care professional if they continue or are bothersome):  headache  loss of appetite  mood changes  nausea, vomiting  pain, tingling, numbness in the hands or feet This list may not describe all possible side effects. Call your doctor for medical advice about side effects. You may report side effects to FDA at 1-800-FDA-1088. Where should I keep my medicine?  Keep out of the reach of children. This medicine can be abused. Keep your medicine in a safe place to protect it from theft. Do not share this medicine with anyone. Selling or giving away this medicine is dangerous and against the law. This medicine may cause accidental overdose and death if taken by other adults, children, or pets. Mix any unused medicine with a substance like cat litter or coffee grounds. Then throw the medicine away in a sealed container like a sealed bag or a coffee can with a lid. Do not use the medicine after the expiration date. Store at room temperature between 15 and 30 degrees C (59 and 86 degrees F). Do not freeze. Protect from light. Keep container tightly closed. NOTE: This sheet is a summary. It may not cover all possible information. If you have questions about this medicine, talk to your doctor, pharmacist, or health care provider.  2020 Elsevier/Gold Standard (2016-08-03 13:26:05)

## 2020-01-21 ENCOUNTER — Encounter: Payer: Self-pay | Admitting: Certified Nurse Midwife

## 2020-01-21 ENCOUNTER — Other Ambulatory Visit: Payer: Self-pay

## 2020-01-21 ENCOUNTER — Ambulatory Visit: Payer: BC Managed Care – PPO | Admitting: Certified Nurse Midwife

## 2020-01-21 VITALS — BP 127/59 | HR 88 | Ht 61.0 in | Wt 294.3 lb

## 2020-01-21 DIAGNOSIS — N920 Excessive and frequent menstruation with regular cycle: Secondary | ICD-10-CM | POA: Diagnosis not present

## 2020-01-21 NOTE — Patient Instructions (Signed)
Endometrial Ablation Endometrial ablation is a procedure that destroys the thin inner layer of the lining of the uterus (endometrium). This procedure may be done:  To stop heavy periods.  To stop bleeding that is causing anemia.  To control irregular bleeding.  To treat bleeding caused by small tumors (fibroids) in the endometrium. This procedure is often an alternative to major surgery, such as removal of the uterus and cervix (hysterectomy). As a result of this procedure:  You may not be able to have children. However, if you are premenopausal (you have not gone through menopause): ? You may still have a small chance of getting pregnant. ? You will need to use a reliable method of birth control after the procedure to prevent pregnancy.  You may stop having a menstrual period, or you may have only a small amount of bleeding during your period. Menstruation may return several years after the procedure. Tell a health care provider about:  Any allergies you have.  All medicines you are taking, including vitamins, herbs, eye drops, creams, and over-the-counter medicines.  Any problems you or family members have had with the use of anesthetic medicines.  Any blood disorders you have.  Any surgeries you have had.  Any medical conditions you have. What are the risks? Generally, this is a safe procedure. However, problems may occur, including:  A hole (perforation) in the uterus or bowel.  Infection of the uterus, bladder, or vagina.  Bleeding.  Damage to other structures or organs.  An air bubble in the lung (air embolus).  Problems with pregnancy after the procedure.  Failure of the procedure.  Decreased ability to diagnose cancer in the endometrium. What happens before the procedure?  You will have tests of your endometrium to make sure there are no pre-cancerous cells or cancer cells present.  You may have an ultrasound of the uterus.  You may be given medicines to  thin the endometrium.  Ask your health care provider about: ? Changing or stopping your regular medicines. This is especially important if you take diabetes medicines or blood thinners. ? Taking medicines such as aspirin and ibuprofen. These medicines can thin your blood. Do not take these medicines before your procedure if your doctor tells you not to.  Plan to have someone take you home from the hospital or clinic. What happens during the procedure?   You will lie on an exam table with your feet and legs supported as in a pelvic exam.  To lower your risk of infection: ? Your health care team will wash or sanitize their hands and put on germ-free (sterile) gloves. ? Your genital area will be washed with soap.  An IV tube will be inserted into one of your veins.  You will be given a medicine to help you relax (sedative).  A surgical instrument with a light and camera (resectoscope) will be inserted into your vagina and moved into your uterus. This allows your surgeon to see inside your uterus.  Endometrial tissue will be removed using one of the following methods: ? Radiofrequency. This method uses a radiofrequency-alternating electric current to remove the endometrium. ? Cryotherapy. This method uses extreme cold to freeze the endometrium. ? Heated-free liquid. This method uses a heated saltwater (saline) solution to remove the endometrium. ? Microwave. This method uses high-energy microwaves to heat up the endometrium and remove it. ? Thermal balloon. This method involves inserting a catheter with a balloon tip into the uterus. The balloon tip is filled with   heated fluid to remove the endometrium. The procedure may vary among health care providers and hospitals. What happens after the procedure?  Your blood pressure, heart rate, breathing rate, and blood oxygen level will be monitored until the medicines you were given have worn off.  As tissue healing occurs, you may notice  vaginal bleeding for 4-6 weeks after the procedure. You may also experience: ? Cramps. ? Thin, watery vaginal discharge that is light pink or brown in color. ? A need to urinate more frequently than usual. ? Nausea.  Do not drive for 24 hours if you were given a sedative.  Do not have sex or insert anything into your vagina until your health care provider approves. Summary  Endometrial ablation is done to treat the many causes of heavy menstrual bleeding.  The procedure may be done only after medications have been tried to control the bleeding.  Plan to have someone take you home from the hospital or clinic. This information is not intended to replace advice given to you by your health care provider. Make sure you discuss any questions you have with your health care provider. Document Revised: 03/14/2018 Document Reviewed: 10/14/2016 Elsevier Patient Education  [place].   Tranexamic acid oral tablets What is this medicine? TRANEXAMIC ACID (TRAN ex AM ik AS id) slows down or stops blood clots from being broken down. This medicine is used to treat heavy monthly menstrual bleeding. This medicine may be used for other purposes; ask your health care provider or pharmacist if you have questions. COMMON BRAND NAME(S): Cyklokapron, Lysteda What should I tell my health care provider before I take this medicine? They need to know if you have any of these conditions:  bleeding in the brain  blood clotting problems  kidney disease  vision problems  an unusual allergic reaction to tranexamic acid, other medicines, foods, dyes, or preservatives  pregnant or trying to get pregnant  breast-feeding How should I use this medicine? Take this medicine by mouth with a glass of water. Follow the directions on the prescription label. Do not cut, crush, or chew this medicine. You can take it with or without food. If it upsets your stomach, take it with food. Take your medicine at  regular intervals. Do not take it more often than directed. Do not stop taking except on your doctor's advice. Do not take this medicine until your period has started. Do not take it for more than 5 days in a row. Do not take this medicine when you do not have your period. Talk to your pediatrician regarding the use of this medicine in children. While this drug may be prescribed for female children as young as 75 years of age for selected conditions, precautions do apply. Overdosage: If you think you have taken too much of this medicine contact a poison control center or emergency room at once. NOTE: This medicine is only for you. Do not share this medicine with others. What if I miss a dose? If you miss a dose, take it when you remember, and then take your next dose at least 6 hours later. Do not take more than 2 tablets at a time to make up for missed doses. What may interact with this medicine? Do not take this medicine with any of the following medications:  estrogens  birth control pills, patches, injections, rings or other devices that contain both an estrogen and a progestin This medicine may also interact with the following medications:  certain medicines  used to help your blood clot  tretinoin (taken by mouth) This list may not describe all possible interactions. Give your health care provider a list of all the medicines, herbs, non-prescription drugs, or dietary supplements you use. Also tell them if you smoke, drink alcohol, or use illegal drugs. Some items may interact with your medicine. What should I watch for while using this medicine? Tell your doctor or healthcare professional if your symptoms do not start to get better or if they get worse. Tell your doctor or healthcare professional if you notice any eye problems while taking this medicine. Your doctor will refer you to an eye doctor who will examine your eyes. What side effects may I notice from receiving this  medicine? Side effects that you should report to your doctor or health care professional as soon as possible:  allergic reactions like skin rash, itching or hives, swelling of the face, lips, or tongue  breathing difficulties  changes in vision  sudden or severe pain in the chest, legs, head, or groin  unusually weak or tired Side effects that usually do not require medical attention (report to your doctor or health care professional if they continue or are bothersome):  back pain  headache  muscle or joint aches  sinus and nasal problems  stomach pain  tiredness This list may not describe all possible side effects. Call your doctor for medical advice about side effects. You may report side effects to FDA at 1-800-FDA-1088. Where should I keep my medicine? Keep out of the reach of children. Store at room temperature between 15 and 30 degrees C (59 and 86 degrees F). Throw away any unused medicine after the expiration date. NOTE: This sheet is a summary. It may not cover all possible information. If you have questions about this medicine, talk to your doctor, pharmacist, or health care provider.  2020 Elsevier/Gold Standard (2015-10-30 09:12:15)

## 2020-01-21 NOTE — Progress Notes (Signed)
Pt present today due to having menorrhagia with regular cycles. Pt's LMP 01/08/2020. Pt stated that she has noticed heavy bleeding x 6 months. Pt stated that her cycles are heavy and noticed that it will run down her legs also noticed PMS symptoms increased.  Pt stated changing her pad every 2 hours.

## 2020-01-22 LAB — CBC
Hematocrit: 42 % (ref 34.0–46.6)
Hemoglobin: 14 g/dL (ref 11.1–15.9)
MCH: 27.7 pg (ref 26.6–33.0)
MCHC: 33.3 g/dL (ref 31.5–35.7)
MCV: 83 fL (ref 79–97)
Platelets: 297 10*3/uL (ref 150–450)
RBC: 5.05 x10E6/uL (ref 3.77–5.28)
RDW: 13.4 % (ref 11.7–15.4)
WBC: 8.1 10*3/uL (ref 3.4–10.8)

## 2020-01-22 LAB — FERRITIN: Ferritin: 33 ng/mL (ref 15–150)

## 2020-01-22 LAB — FSH/LH
FSH: 5.3 m[IU]/mL
LH: 11.2 m[IU]/mL

## 2020-01-22 LAB — TSH: TSH: 0.759 u[IU]/mL (ref 0.450–4.500)

## 2020-01-22 LAB — ESTRADIOL: Estradiol: 79.9 pg/mL

## 2020-01-26 NOTE — Progress Notes (Signed)
GYN ENCOUNTER NOTE  Subjective:       Julia Lynch is a 44 y.o. G59P0 female is here for gynecologic evaluation of the following issues:  1. Menorrhagia with regular cycle for the last six (6) months 2. Increased headaches and other PMS symptoms  Denies difficulty breathing or respiratory distress, chest pain, abdominal pain, dysuria, and leg pain or swelling.     Gynecologic History  Patient's last menstrual period was 01/08/2020. Period Cycle (Days): 28 Period Duration (Days): 5 Period Pattern: Regular Menstrual Flow: Heavy Menstrual Control: Maxi pad Dysmenorrhea: (!) Mild Dysmenorrhea Symptoms: Headache, Cramping, Nausea, Diarrhea  Contraception: vasectomy  Last Pap: 10/2018. Results were: Neg/Neg  Last mammogram: 08/2017. Results were: BI-RADS 1  Obstetric History  OB History  Gravida Para Term Preterm AB Living  3         3  SAB TAB Ectopic Multiple Live Births          3    # Outcome Date GA Lbr Len/2nd Weight Sex Delivery Anes PTL Lv  3 Gravida 2013    M Vag-Spont   LIV  2 Gravida 2012    M Vag-Spont   LIV  1 Gravida 2007    M Vag-Spont   LIV    Past Medical History:  Diagnosis Date  . Anxiety   . Arthritis   . Depression   . Diabetic acidosis, type II ([place])   . GERD (gastroesophageal reflux disease)   . History of chicken pox   . PCOS (polycystic ovarian syndrome)   . Vitamin D deficiency disease     Past Surgical History:  Procedure Laterality Date  . left ankle repair  1995    Current Outpatient Medications on File Prior to Visit  Medication Sig Dispense Refill  . citalopram (CELEXA) 40 MG tablet TAKE 1 TABLET BY MOUTH EVERY DAY 30 tablet 11  . cyclobenzaprine (FLEXERIL) 5 MG tablet Take 1 tablet (5 mg total) by mouth 3 (three) times daily as needed for muscle spasms. 30 tablet 1  . diazepam (VALIUM) 5 MG tablet TAKE AS NEEDED FOR HEADACHES, NO MORE THAN 3 IN A DAY 30 tablet 5  . diphenoxylate-atropine (LOMOTIL) 2.5-0.025 MG tablet Take 1  tablet by mouth 4 (four) times daily. 120 tablet 1  . hydrochlorothiazide (HYDRODIURIL) 25 MG tablet TAKE 1 TABLET BY MOUTH EVERY DAY 30 tablet 11  . metFORMIN (GLUCOPHAGE-XR) 500 MG 24 hr tablet TAKE 2 TABLETS BY MOUTH EVERY DAY WITH BREAKFAST 60 tablet 5  . omeprazole (PRILOSEC) 20 MG capsule TAKE 1 CAPSULE BY MOUTH EVERY DAY**NEEDS REFILLS* 90 capsule 2  . triamcinolone cream (KENALOG) 0.1 % Apply 1 application topically 2 (two) times daily. 30 g 0  . valACYclovir (VALTREX) 500 MG tablet Take 1 tablet (500 mg total) by mouth daily. Can increase to twice a day for 5 days in the event of a recurrence 30 tablet 12  . Vitamin D, Ergocalciferol, (DRISDOL) 1.25 MG (50000 UT) CAPS capsule TAKE 1 CAPSULE (50,000 UNITS TOTAL) BY MOUTH 2 (TWO) TIMES A WEEK. 8 capsule 7  . [DISCONTINUED] citalopram (CELEXA) 40 MG tablet     . [DISCONTINUED] hydrochlorothiazide (HYDRODIURIL) 25 MG tablet TAKE 1 TABLET BY MOUTH EVERY DAY 90 tablet 3   No current facility-administered medications on file prior to visit.    Allergies  Allergen Reactions  . Ibuprofen     Other reaction(s): Other (See Comments) GI Upset  . Lodine [Etodolac]     Social History  Socioeconomic History  . Marital status: Married    Spouse name: Not on file  . Number of children: 3  . Years of education: Not on file  . Highest education level: Not on file  Occupational History  . Not on file  Tobacco Use  . Smoking status: Former Smoker    Years: 10.00    Quit date: 10/11/2004    Years since quitting: 15.3  . Smokeless tobacco: Never Used  Substance and Sexual Activity  . Alcohol use: Yes    Alcohol/week: 1.0 standard drinks    Types: 1 Standard drinks or equivalent per week    Comment: Saturdays  . Drug use: No  . Sexual activity: Yes    Comment: husband-vasectomy  Other Topics Concern  . Not on file  Social History Narrative  . Not on file   Social Determinants of Health   Financial Resource Strain:   . Difficulty  of Paying Living Expenses:   Food Insecurity:   . Worried About Programme researcher, broadcasting/film/video in the Last Year:   . Barista in the Last Year:   Transportation Needs:   . Freight forwarder (Medical):   Marland Kitchen Lack of Transportation (Non-Medical):   Physical Activity:   . Days of Exercise per Week:   . Minutes of Exercise per Session:   Stress:   . Feeling of Stress :   Social Connections:   . Frequency of Communication with Friends and Family:   . Frequency of Social Gatherings with Friends and Family:   . Attends Religious Services:   . Active Member of Clubs or Organizations:   . Attends Banker Meetings:   Marland Kitchen Marital Status:   Intimate Partner Violence:   . Fear of Current or Ex-Partner:   . Emotionally Abused:   Marland Kitchen Physically Abused:   . Sexually Abused:     Family History  Problem Relation Age of Onset  . Diabetes Father   . Hypertension Father   . Hepatitis C Mother   . Heart attack Maternal Grandfather   . Hypertension Paternal Grandmother   . Diabetes Paternal Grandmother     The following portions of the patient's history were reviewed and updated as appropriate: allergies, current medications, past family history, past medical history, past social history, past surgical history and problem list.  Review of Systems  ROS negative except as noted above. Information obtained from patient.   Objective:   BP (!) 127/59   Pulse 88   Ht 5\' 1"  (1.549 m)   Wt 294 lb 4.8 oz (133.5 kg)   LMP 01/08/2020   BMI 55.61 kg/m   CONSTITUTIONAL: Well-developed, well-nourished female in no acute distress.   PELVIC:  External Genitalia: Normal  BUS: Normal  Vagina: Normal  Cervix: Normal  Uterus: Normal size, shape,consistency, mobile  Adnexa: Normal   MUSCULOSKELETAL: Normal range of motion. No tenderness.  No cyanosis, clubbing, or edema.  Recent Results (from the past 2160 hour(s))  CBC     Status: None   Collection Time: 01/21/20 11:41 AM  Result  Value Ref Range   WBC 8.1 3.4 - 10.8 x10E3/uL   RBC 5.05 3.77 - 5.28 x10E6/uL   Hemoglobin 14.0 11.1 - 15.9 g/dL   Hematocrit 03/22/20 09.9 - 46.6 %   MCV 83 79 - 97 fL   MCH 27.7 26.6 - 33.0 pg   MCHC 33.3 31.5 - 35.7 g/dL   RDW 83.3 82.5 - 05.3 %   Platelets  297 150 - 450 x10E3/uL  Estradiol     Status: None   Collection Time: 01/21/20 11:41 AM  Result Value Ref Range   Estradiol 79.9 pg/mL    Comment:                     Adult Female:                       Follicular phase   12.5 -   166.0                       Ovulation phase    85.8 -   498.0                       Luteal phase       43.8 -   211.0                       Postmenopausal     <6.0 -    54.7                     Pregnancy                       1st trimester     215.0 - >4300.0 Roche ECLIA methodology   Ferritin     Status: None   Collection Time: 01/21/20 11:41 AM  Result Value Ref Range   Ferritin 33 15 - 150 ng/mL  FSH/LH     Status: None   Collection Time: 01/21/20 11:41 AM  Result Value Ref Range   LH 11.2 mIU/mL    Comment:                     Adult Female:                       Follicular phase      2.4 -  12.6                       Ovulation phase      14.0 -  95.6                       Luteal phase          1.0 -  11.4                       Postmenopausal        7.7 -  58.5    FSH 5.3 mIU/mL    Comment:                     Adult Female:                       Follicular phase      3.5 -  12.5                       Ovulation phase       4.7 -  21.5                       Luteal phase          1.7 -   7.7  Postmenopausal       25.8 - 134.8   TSH     Status: None   Collection Time: 01/21/20 11:41 AM  Result Value Ref Range   TSH 0.759 0.450 - 4.500 uIU/mL      Assessment:   1. Menorrhagia with regular cycle  - CBC - Estradiol - Ferritin - FSH/LH - TSH     Plan:   Labs today, see orders.   Discussed bleeding management, see AVS.   Reviewed red flag symptoms and  when to call.   RTC x 1-2 weeks for ultrasound and results review or sooner if needed   Gunnar Bulla, CNM [facility], [facility]

## 2020-01-31 ENCOUNTER — Other Ambulatory Visit (INDEPENDENT_AMBULATORY_CARE_PROVIDER_SITE_OTHER): Payer: BC Managed Care – PPO | Admitting: Certified Nurse Midwife

## 2020-01-31 DIAGNOSIS — N920 Excessive and frequent menstruation with regular cycle: Secondary | ICD-10-CM

## 2020-01-31 NOTE — Progress Notes (Signed)
Ultrasound orders placed.    Gunnar Bulla, CNM [facility], [facility] 01/31/20 2:45 PM

## 2020-02-07 ENCOUNTER — Ambulatory Visit (INDEPENDENT_AMBULATORY_CARE_PROVIDER_SITE_OTHER): Payer: BC Managed Care – PPO

## 2020-02-07 ENCOUNTER — Ambulatory Visit: Payer: BC Managed Care – PPO | Admitting: Certified Nurse Midwife

## 2020-02-07 ENCOUNTER — Other Ambulatory Visit: Payer: Self-pay

## 2020-02-07 ENCOUNTER — Encounter: Payer: Self-pay | Admitting: Certified Nurse Midwife

## 2020-02-07 VITALS — BP 139/77 | HR 99 | Ht 61.0 in | Wt 297.2 lb

## 2020-02-07 DIAGNOSIS — N920 Excessive and frequent menstruation with regular cycle: Secondary | ICD-10-CM | POA: Diagnosis not present

## 2020-02-07 NOTE — Patient Instructions (Signed)
Endometrial Ablation  Endometrial ablation is a procedure that destroys the thin inner layer of the lining of the uterus (endometrium). This procedure may be done:  To stop heavy periods.  To stop bleeding that is causing anemia.  To control irregular bleeding.  To treat bleeding caused by small tumors (fibroids) in the endometrium.  This procedure is often an alternative to major surgery, such as removal of the uterus and cervix (hysterectomy). As a result of this procedure:  You may not be able to have children. However, if you are premenopausal (you have not gone through menopause):  You may still have a small chance of getting pregnant.  You will need to use a reliable method of birth control after the procedure to prevent pregnancy.  You may stop having a menstrual period, or you may have only a small amount of bleeding during your period. Menstruation may return several years after the procedure.  Tell a health care provider about:  Any allergies you have.  All medicines you are taking, including vitamins, herbs, eye drops, creams, and over-the-counter medicines.  Any problems you or family members have had with the use of anesthetic medicines.  Any blood disorders you have.  Any surgeries you have had.  Any medical conditions you have.  What are the risks?  Generally, this is a safe procedure. However, problems may occur, including:  A hole (perforation) in the uterus or bowel.  Infection of the uterus, bladder, or vagina.  Bleeding.  Damage to other structures or organs.  An air bubble in the lung (air embolus).  Problems with pregnancy after the procedure.  Failure of the procedure.  Decreased ability to diagnose cancer in the endometrium.  What happens before the procedure?  You will have tests of your endometrium to make sure there are no pre-cancerous cells or cancer cells present.  You may have an ultrasound of the uterus.  You may be given medicines to thin the endometrium.  Ask your health care  provider about:  Changing or stopping your regular medicines. This is especially important if you take diabetes medicines or blood thinners.  Taking medicines such as aspirin and ibuprofen. These medicines can thin your blood. Do not take these medicines before your procedure if your doctor tells you not to.  Plan to have someone take you home from the hospital or clinic.  What happens during the procedure?    You will lie on an exam table with your feet and legs supported as in a pelvic exam.  To lower your risk of infection:  Your health care team will wash or sanitize their hands and put on germ-free (sterile) gloves.  Your genital area will be washed with soap.  An IV tube will be inserted into one of your veins.  You will be given a medicine to help you relax (sedative).  A surgical instrument with a light and camera (resectoscope) will be inserted into your vagina and moved into your uterus. This allows your surgeon to see inside your uterus.  Endometrial tissue will be removed using one of the following methods:  Radiofrequency. This method uses a radiofrequency-alternating electric current to remove the endometrium.  Cryotherapy. This method uses extreme cold to freeze the endometrium.  Heated-free liquid. This method uses a heated saltwater (saline) solution to remove the endometrium.  Microwave. This method uses high-energy microwaves to heat up the endometrium and remove it.  Thermal balloon. This method involves inserting a catheter with a balloon tip into   the uterus. The balloon tip is filled with heated fluid to remove the endometrium.  The procedure may vary among health care providers and hospitals.  What happens after the procedure?  Your blood pressure, heart rate, breathing rate, and blood oxygen level will be monitored until the medicines you were given have worn off.  As tissue healing occurs, you may notice vaginal bleeding for 4-6 weeks after the procedure. You may also  experience:  Cramps.  Thin, watery vaginal discharge that is light pink or brown in color.  A need to urinate more frequently than usual.  Nausea.  Do not drive for 24 hours if you were given a sedative.  Do not have sex or insert anything into your vagina until your health care provider approves.  Summary  Endometrial ablation is done to treat the many causes of heavy menstrual bleeding.  The procedure may be done only after medications have been tried to control the bleeding.  Plan to have someone take you home from the hospital or clinic.  This information is not intended to replace advice given to you by your health care provider. Make sure you discuss any questions you have with your health care provider.  Document Revised: 03/14/2018 Document Reviewed: 10/14/2016  Elsevier Patient Education  2020 Elsevier Inc[employer]

## 2020-02-07 NOTE — Progress Notes (Signed)
GYN ENCOUNTER NOTE  Subjective:       Julia Lynch is a 44 y.o. G43P2003 female here for review of ultrasound and lab results.   Seen in office on 01/21/2020 for evaluation of menorrhagia with regular cycle.   Denies difficulty breathing or respiratory distress, chest pain, abdominal pain, dysuria, and leg pain or swelling.    Gynecologic History  Patient's last menstrual period was 01/08/2020.  Contraception: vasectomy  Last Pap: 10/2018. Results were: Neg/Neg  Last mammogram: 08/2017. Results were: BI-RADS 1  Obstetric History  OB History  Gravida Para Term Preterm AB Living  3 2 2     3   SAB TAB Ectopic Multiple Live Births          3    # Outcome Date GA Lbr Len/2nd Weight Sex Delivery Anes PTL Lv  3 Gravida 2013    M Vag-Spont   LIV  2 Term 01/04/11   9 lb 3 oz (4.167 kg) M Vag-Spont  N LIV  1 Term 08/29/06   7 lb 2 oz (3.232 kg) M Vag-Spont  N LIV    Past Medical History:  Diagnosis Date  . Anxiety   . Arthritis   . Depression   . Diabetic acidosis, type II ([facility])   . GERD (gastroesophageal reflux disease)   . History of chicken pox   . PCOS (polycystic ovarian syndrome)   . Vitamin D deficiency disease     Past Surgical History:  Procedure Laterality Date  . left ankle repair  1995    Current Outpatient Medications on File Prior to Visit  Medication Sig Dispense Refill  . citalopram (CELEXA) 40 MG tablet TAKE 1 TABLET BY MOUTH EVERY DAY 30 tablet 11  . cyclobenzaprine (FLEXERIL) 5 MG tablet Take 1 tablet (5 mg total) by mouth 3 (three) times daily as needed for muscle spasms. 30 tablet 1  . diazepam (VALIUM) 5 MG tablet TAKE AS NEEDED FOR HEADACHES, NO MORE THAN 3 IN A DAY 30 tablet 5  . diphenoxylate-atropine (LOMOTIL) 2.5-0.025 MG tablet Take 1 tablet by mouth 4 (four) times daily. 120 tablet 1  . hydrochlorothiazide (HYDRODIURIL) 25 MG tablet TAKE 1 TABLET BY MOUTH EVERY DAY 30 tablet 11  . metFORMIN (GLUCOPHAGE-XR) 500 MG 24 hr tablet TAKE 2  TABLETS BY MOUTH EVERY DAY WITH BREAKFAST 60 tablet 5  . omeprazole (PRILOSEC) 20 MG capsule TAKE 1 CAPSULE BY MOUTH EVERY DAY**NEEDS REFILLS* 90 capsule 2  . triamcinolone cream (KENALOG) 0.1 % Apply 1 application topically 2 (two) times daily. 30 g 0  . valACYclovir (VALTREX) 500 MG tablet Take 1 tablet (500 mg total) by mouth daily. Can increase to twice a day for 5 days in the event of a recurrence 30 tablet 12  . Vitamin D, Ergocalciferol, (DRISDOL) 1.25 MG (50000 UT) CAPS capsule TAKE 1 CAPSULE (50,000 UNITS TOTAL) BY MOUTH 2 (TWO) TIMES A WEEK. 8 capsule 7  . [DISCONTINUED] citalopram (CELEXA) 40 MG tablet     . [DISCONTINUED] hydrochlorothiazide (HYDRODIURIL) 25 MG tablet TAKE 1 TABLET BY MOUTH EVERY DAY 90 tablet 3   No current facility-administered medications on file prior to visit.    Allergies  Allergen Reactions  . Ibuprofen     Other reaction(s): Other (See Comments) GI Upset  . Lodine [Etodolac]     Social History   Socioeconomic History  . Marital status: Married    Spouse name: Not on file  . Number of children: 3  . Years of  education: Not on file  . Highest education level: Not on file  Occupational History  . Not on file  Tobacco Use  . Smoking status: Former Smoker    Years: 10.00    Quit date: 10/11/2004    Years since quitting: 15.3  . Smokeless tobacco: Never Used  Substance and Sexual Activity  . Alcohol use: Yes    Alcohol/week: 1.0 standard drinks    Types: 1 Standard drinks or equivalent per week    Comment: Saturdays  . Drug use: No  . Sexual activity: Yes    Comment: husband-vasectomy  Other Topics Concern  . Not on file  Social History Narrative  . Not on file   Social Determinants of Health   Financial Resource Strain:   . Difficulty of Paying Living Expenses:   Food Insecurity:   . Worried About Programme researcher, broadcasting/film/video in the Last Year:   . Barista in the Last Year:   Transportation Needs:   . Freight forwarder  (Medical):   Marland Kitchen Lack of Transportation (Non-Medical):   Physical Activity:   . Days of Exercise per Week:   . Minutes of Exercise per Session:   Stress:   . Feeling of Stress :   Social Connections:   . Frequency of Communication with Friends and Family:   . Frequency of Social Gatherings with Friends and Family:   . Attends Religious Services:   . Active Member of Clubs or Organizations:   . Attends Banker Meetings:   Marland Kitchen Marital Status:   Intimate Partner Violence:   . Fear of Current or Ex-Partner:   . Emotionally Abused:   Marland Kitchen Physically Abused:   . Sexually Abused:     Family History  Problem Relation Age of Onset  . Diabetes Father   . Hypertension Father   . Hepatitis C Mother   . Heart attack Maternal Grandfather   . Hypertension Paternal Grandmother   . Diabetes Paternal Grandmother     The following portions of the patient's history were reviewed and updated as appropriate: allergies, current medications, past family history, past medical history, past social history, past surgical history and problem list.  Review of Systems  ROS negative except as noted above. Information obtained from patient.   Objective:   BP 139/77   Pulse 99   Ht 5\' 1"  (1.549 m)   Wt 297 lb 3 oz (134.8 kg)   LMP 01/08/2020   BMI 56.15 kg/m    CONSTITUTIONAL: Well-developed, well-nourished female in no acute distress.   PELVIC: Unable to visual cervix due to habitus; endometrial biopsy deferred.   Recent Results (from the past 2160 hour(s))  CBC     Status: None   Collection Time: 01/21/20 11:41 AM  Result Value Ref Range   WBC 8.1 3.4 - 10.8 x10E3/uL   RBC 5.05 3.77 - 5.28 x10E6/uL   Hemoglobin 14.0 11.1 - 15.9 g/dL   Hematocrit 03/22/20 18.8 - 46.6 %   MCV 83 79 - 97 fL   MCH 27.7 26.6 - 33.0 pg   MCHC 33.3 31.5 - 35.7 g/dL   RDW 41.6 60.6 - 30.1 %   Platelets 297 150 - 450 x10E3/uL  Estradiol     Status: None   Collection Time: 01/21/20 11:41 AM  Result Value  Ref Range   Estradiol 79.9 pg/mL    Comment:  Adult Female:                       Follicular phase   12.5 -   166.0                       Ovulation phase    85.8 -   498.0                       Luteal phase       43.8 -   211.0                       Postmenopausal     <6.0 -    54.7                     Pregnancy                       1st trimester     215.0 - >4300.0 Roche ECLIA methodology   Ferritin     Status: None   Collection Time: 01/21/20 11:41 AM  Result Value Ref Range   Ferritin 33 15 - 150 ng/mL  FSH/LH     Status: None   Collection Time: 01/21/20 11:41 AM  Result Value Ref Range   LH 11.2 mIU/mL    Comment:                     Adult Female:                       Follicular phase      2.4 -  12.6                       Ovulation phase      14.0 -  95.6                       Luteal phase          1.0 -  11.4                       Postmenopausal        7.7 -  58.5    FSH 5.3 mIU/mL    Comment:                     Adult Female:                       Follicular phase      3.5 -  12.5                       Ovulation phase       4.7 -  21.5                       Luteal phase          1.7 -   7.7                       Postmenopausal       25.8 - 134.8   TSH     Status: None   Collection Time: 01/21/20 11:41 AM  Result Value Ref Range   TSH 0.759 0.450 -  4.500 uIU/mL     ULTRASOUND REPORT  Location: [facility]  Date of Service: 02/07/2020   Indications:AUB Findings:  The uterus is anteverted and measures 8.6 x 4.4 x 5.0 cm. Echo texture is homogenous without evidence of focal masses.  The Endometrium measures 27 mm.  Right Ovary measures 2.6 x 1.3 x 1.9 cm. It is normal in appearance. Left Ovary measures 2.1 x 1.6 x 1.5 cm. It is normal in appearance. Survey of the adnexa demonstrates no adnexal masses. There is no free fluid in the cul de sac.  Impression: 1. Pelvic ultrasound is WNL at this time.  Recommendations: 1.Clinical  correlation with the patient's History and Physical Exam.   Assessment:   1. Menorrhagia with regular cycle   Plan:   Patient desires ablation after reviewing options discussed during last visit.   RTC to discuss ablation with Dr. Marcelline Mates or sooner if needed.    Diona Fanti, CNM [facility], [facility] 02/07/20 12:16 PM

## 2020-02-10 ENCOUNTER — Other Ambulatory Visit: Payer: Self-pay | Admitting: Physician Assistant

## 2020-02-10 DIAGNOSIS — E1165 Type 2 diabetes mellitus with hyperglycemia: Secondary | ICD-10-CM

## 2020-02-10 NOTE — Telephone Encounter (Signed)
Requested medication (s) are due for refill today: yes  Requested medication (s) are on the active medication list: yes  Last refill:  06/06/19  Future visit scheduled: no  Notes to clinic:  overdue for labwork   Requested Prescriptions  Pending Prescriptions Disp Refills   metFORMIN (GLUCOPHAGE-XR) 500 MG 24 hr tablet [Pharmacy Med Name: METFORMIN HCL ER 500 MG TABLET] 60 tablet 5    Sig: TAKE 2 TABLETS BY [place]      Endocrinology:  Diabetes - Biguanides Failed - 02/10/2020  9:45 AM      Failed - Cr in normal range and within 360 days    Creatinine, Ser  Date Value Ref Range Status  10/13/2018 0.81 0.57 - 1.00 mg/dL Final          Failed - HBA1C is between 0 and 7.9 and within 180 days    Hemoglobin A1C  Date Value Ref Range Status  12/15/2018 7.1 (A) 4.0 - 5.6 % Final   Hgb A1c MFr Bld  Date Value Ref Range Status  10/13/2018 8.0 (H) 4.8 - 5.6 % Final    Comment:             Prediabetes: 5.7 - 6.4          Diabetes: >6.4          Glycemic control for adults with diabetes: <7.0           Failed - eGFR in normal range and within 360 days    GFR calc Af Amer  Date Value Ref Range Status  10/13/2018 104 >59 mL/min/1.73 Final   GFR calc non Af Amer  Date Value Ref Range Status  10/13/2018 90 >59 mL/min/1.73 Final          Passed - Valid encounter within last 6 months    Recent Outpatient Visits           1 month ago Irritable bowel syndrome with diarrhea   [place], [place], [place]   6 months ago Benign essential HTN   [place], [place], [place]   1 year ago Type 2 diabetes mellitus with hyperglycemia, without long-term current use of insulin [facility])   [place], [place], [place]   1 year ago Benign essential HTN   [place], [place], [place]   1 year ago Tension headache   [facility] [place], Dionne Bucy, MD

## 2020-02-20 ENCOUNTER — Other Ambulatory Visit (HOSPITAL_COMMUNITY)
Admission: RE | Admit: 2020-02-20 | Discharge: 2020-02-20 | Disposition: A | Discharge status: Home / Self Care | Payer: BC Managed Care – PPO | Source: Ambulatory Visit | Attending: Obstetrics and Gynecology | Admitting: Obstetrics and Gynecology

## 2020-02-20 ENCOUNTER — Ambulatory Visit (INDEPENDENT_AMBULATORY_CARE_PROVIDER_SITE_OTHER): Payer: BC Managed Care – PPO | Admitting: Obstetrics and Gynecology

## 2020-02-20 ENCOUNTER — Encounter: Payer: Self-pay | Admitting: Obstetrics and Gynecology

## 2020-02-20 ENCOUNTER — Other Ambulatory Visit: Payer: Self-pay

## 2020-02-20 VITALS — BP 126/94 | HR 88 | Ht 61.0 in | Wt 299.6 lb

## 2020-02-20 DIAGNOSIS — E282 Polycystic ovarian syndrome: Secondary | ICD-10-CM | POA: Diagnosis not present

## 2020-02-20 DIAGNOSIS — E139 Other specified diabetes mellitus without complications: Secondary | ICD-10-CM | POA: Diagnosis not present

## 2020-02-20 DIAGNOSIS — N920 Excessive and frequent menstruation with regular cycle: Secondary | ICD-10-CM

## 2020-02-20 DIAGNOSIS — I10 Essential (primary) hypertension: Secondary | ICD-10-CM

## 2020-02-20 DIAGNOSIS — Z6841 Body Mass Index (BMI) 40.0 and over, adult: Secondary | ICD-10-CM

## 2020-02-20 NOTE — Progress Notes (Signed)
GYNECOLOGY PROGRESS NOTE  Subjective:    Patient ID: Julia Lynch, female    DOB: [birth year], 44 y.o.   MRN: [id number]  HPI  Patient is a 44 y.o. [phone] female with a PMH of DM, PCOS, HTN, and obesity, who presents for consultation to discuss surgical management of her heavy cycles.  She was referred from Serafina Royals, CNM.  Patient reports that over the past 6 months her periods have been very heavy, with passage of clots.  Cycles currently lasting 5 days and are heavy 4 out of the 5.  She declined use of hormonal methods to regulate her cycles, and desires surgical intervention.     Gynecologic History Patient's last menstrual period was 02/09/2020.Marland Kitchen Period Cycle (Days): 28 Period Duration (Days): 5 Period Pattern: Regular Menstrual Flow: Heavy Menstrual Control: Maxi pad Dysmenorrhea: (!) Mild Dysmenorrhea Symptoms: Headache, Cramping, Nausea, Diarrhea  Contraception: vasectomy Last Pap: 10/2018. Results were: Neg/Neg Last mammogram: 08/2017. Results were: BI-RADS 1   The following portions of the patient's history were reviewed and updated as appropriate:  She  has a past medical history of Anxiety, Arthritis, Depression, Diabetic acidosis, type II (HCC), GERD (gastroesophageal reflux disease), History of chicken pox, PCOS (polycystic ovarian syndrome), and Vitamin D deficiency disease.   She  has a past surgical history that includes left ankle repair (1995).   Her family history includes Diabetes in her father and paternal grandmother; Heart attack in her maternal grandfather; Hepatitis C in her mother; Hypertension in her father and paternal grandmother.   She  reports that she quit smoking about 15 years ago. She quit after 10.00 years of use. She has never used smokeless tobacco. She reports current alcohol use of about 1.0 standard drinks of alcohol per week. She reports that she does not use drugs.   Current Outpatient Medications on File Prior to Visit   Medication Sig Dispense Refill  . citalopram (CELEXA) 40 MG tablet TAKE 1 TABLET BY MOUTH EVERY DAY 30 tablet 11  . cyclobenzaprine (FLEXERIL) 5 MG tablet Take 1 tablet (5 mg total) by mouth 3 (three) times daily as needed for muscle spasms. 30 tablet 1  . diazepam (VALIUM) 5 MG tablet TAKE AS NEEDED FOR HEADACHES, NO MORE THAN 3 IN A DAY 30 tablet 5  . diphenoxylate-atropine (LOMOTIL) 2.5-0.025 MG tablet Take 1 tablet by mouth 4 (four) times daily. 120 tablet 1  . hydrochlorothiazide (HYDRODIURIL) 25 MG tablet TAKE 1 TABLET BY MOUTH EVERY DAY 30 tablet 11  . metFORMIN (GLUCOPHAGE-XR) 500 MG 24 hr tablet TAKE 2 TABLETS BY MOUTH EVERY DAY WITH BREAKFAST 60 tablet 0  . omeprazole (PRILOSEC) 20 MG capsule TAKE 1 CAPSULE BY MOUTH EVERY DAY**NEEDS REFILLS* 90 capsule 2  . triamcinolone cream (KENALOG) 0.1 % Apply 1 application topically 2 (two) times daily. 30 g 0  . valACYclovir (VALTREX) 500 MG tablet Take 1 tablet (500 mg total) by mouth daily. Can increase to twice a day for 5 days in the event of a recurrence 30 tablet 12  . Vitamin D, Ergocalciferol, (DRISDOL) 1.25 MG (50000 UT) CAPS capsule TAKE 1 CAPSULE (50,000 UNITS TOTAL) BY MOUTH 2 (TWO) TIMES A WEEK. 8 capsule 7  . [DISCONTINUED] citalopram (CELEXA) 40 MG tablet     . [DISCONTINUED] hydrochlorothiazide (HYDRODIURIL) 25 MG tablet TAKE 1 TABLET BY MOUTH EVERY DAY 90 tablet 3   No current facility-administered medications on file prior to visit.   She is allergic to ibuprofen and lodine [etodolac].Marland Kitchen  Review of Systems Pertinent items noted in HPI and remainder of comprehensive ROS otherwise negative.   Objective:   Blood pressure (!) 126/94, pulse 88, height 5\' 1"  (1.549 m), weight 299 lb 9.6 oz (135.9 kg), last menstrual period 02/09/2020. Body mass index is 56.61 kg/m.  General appearance: alert and no distress Abdomen: soft, non-tender; bowel sounds normal; no masses,  no organomegaly Pelvic: external genitalia normal,  rectovaginal septum normal.  Vagina without discharge.  Cervix normal appearing, no lesions and no motion tenderness.  Uterus difficult to assess due to body habitus, however does not feel to be enlarged.  Is mobile and nontender.  Adnexae non-palpable, nontender bilaterally.  Extremities: extremities normal, atraumatic, no cyanosis or edema Neurologic: Grossly normal    Labs:  Lab Results  Component Value Date   TSH 0.759 01/21/2020   Lab Results  Component Value Date   WBC 8.1 01/21/2020   HGB 14.0 01/21/2020   HCT 42.0 01/21/2020   MCV 83 01/21/2020   PLT 297 01/21/2020   Lab Results  Component Value Date   HGBA1C 7.1 (A) 12/15/2018     Imaging:  02/14/2019 PELVIS (TRANSABDOMINAL ONLY) Patient Name: Julia Lynch DOB: 11-30-1975 MRN: [id number] ULTRASOUND REPORT  Location: [facility]  Date of Service: 02/07/2020   Indications:AUB Findings:  The uterus is anteverted and measures 8.6 x 4.4 x 5.0 cm. Echo texture is homogenous without evidence of focal masses.  The Endometrium measures 27 mm.  Right Ovary measures 2.6 x 1.3 x 1.9 cm. It is normal in appearance. Left Ovary measures 2.1 x 1.6 x 1.5 cm. It is normal in appearance. Survey of the adnexa demonstrates no adnexal masses. There is no free fluid in the cul de sac.  Impression: 1. Pelvic ultrasound is WNL at this time.  Recommendations: 1.Clinical correlation with the patient's History and Physical Exam.  Jenine M. 02/09/2020    RDMS  The ultrasound images and findings were reviewed by me and I agree with  the above report.  Marciano Sequin, M.D. 02/12/2020 11:32 AM   Assessment:   Menorrhagia with regular cycle Morbid obesity (BMI 56) DM PCOS HTN   Plan:   1. Menorrhagia with regular cycle  - Further discussed management options for abnormal uterine bleeding including NSAIDs (Naproxen), tranexamic acid (Lysteda), oral progesterone, Depo Provera, Levonogestrel IUD, endometrial ablation or  hysterectomy as definitive surgical management.  Discussed risks and benefits of each method.   Patient still desires endometrial ablation. The risks of surgery were discussed in detail with the patient including but not limited to: bleeding which may require transfusion or reoperation; infection which may require prolonged hospitalization or re-hospitalization and antibiotic therapy; injury to bowel, bladder, or other surrounding organs; need for additional procedures, and other postoperative or anesthesia complications.  Patient was told that the likelihood that her condition and symptoms will be treated effectively with this surgical management was very high; the postoperative expectations were also discussed in detail. The patient also understands the alternative treatment options which were discussed in full. All questions were answered.  She was told that she will be contacted by our surgical scheduler regarding the time and date of her surgery; routine preoperative instructions will be given to her by the preoperative nursing team.   She is aware of need for preoperative COVID testing and subsequent quarantine from time of test to time of surgery; she will be given further preoperative instructions at that COVID screening visit. Patient education included in AVS for  the patient to review at home. Will schedule surgery for 03/07/2020.  Endometrial biopsy performed today (see below) to finalize workup for bleeding.  2. DM - last A1c noted last year was 7.1. Will repeat labs for pre-op. Encouraged compliance with medications.  3. HTN - currently managed with HCTZ.  4. H/o PCOS - stable.  5. Morbid obesity - should not be barrier to desired surgical procedure. Also, patient is more at risk for endometrial hyperplasia, will assess with endometrial biopsy.    Endometrial Biopsy Procedure Note  The patient is positioned on the exam table in the dorsal lithotomy position. Bimanual exam confirms uterine  position and size. A Graves speculum is placed into the vagina. A single toothed tenaculum is placed onto the anterior lip of the cervix. The pipette is placed into the endocervical canal and is advanced to the uterine fundus. Using a piston like technique, with vacuum created by withdrawing the stylus, the endometrial specimen is obtained and transferred to the biopsy container. Minimal bleeding is encountered. The procedure is well tolerated.   Uterine Position: mid    Uterine Length: 8 cm   Uterine Specimen: Average   Post procedure instructions are given. The patient is scheduled for follow up appointment.    A total of 20 minutes were spent face-to-face with the patient during this encounter and over half of that time dealt with counseling and coordination of care.    Rubie Maid, MD [facility]

## 2020-02-20 NOTE — Progress Notes (Signed)
Pt present to discuss having an ablation due to having heavy cycles with clots. Pt stated that she can stand up and feel the blood running down her legs.

## 2020-02-20 NOTE — Patient Instructions (Signed)
Endometrial Ablation  Endometrial ablation is a procedure that destroys the thin inner layer of the lining of the uterus (endometrium). This procedure may be done:  To stop heavy periods.  To stop bleeding that is causing anemia.  To control irregular bleeding.  To treat bleeding caused by small tumors (fibroids) in the endometrium.  This procedure is often an alternative to major surgery, such as removal of the uterus and cervix (hysterectomy). As a result of this procedure:  You may not be able to have children. However, if you are premenopausal (you have not gone through menopause):  You may still have a small chance of getting pregnant.  You will need to use a reliable method of birth control after the procedure to prevent pregnancy.  You may stop having a menstrual period, or you may have only a small amount of bleeding during your period. Menstruation may return several years after the procedure.  Tell a health care provider about:  Any allergies you have.  All medicines you are taking, including vitamins, herbs, eye drops, creams, and over-the-counter medicines.  Any problems you or family members have had with the use of anesthetic medicines.  Any blood disorders you have.  Any surgeries you have had.  Any medical conditions you have.  What are the risks?  Generally, this is a safe procedure. However, problems may occur, including:  A hole (perforation) in the uterus or bowel.  Infection of the uterus, bladder, or vagina.  Bleeding.  Damage to other structures or organs.  An air bubble in the lung (air embolus).  Problems with pregnancy after the procedure.  Failure of the procedure.  Decreased ability to diagnose cancer in the endometrium.  What happens before the procedure?  You will have tests of your endometrium to make sure there are no pre-cancerous cells or cancer cells present.  You may have an ultrasound of the uterus.  You may be given medicines to thin the endometrium.  Ask your health care  provider about:  Changing or stopping your regular medicines. This is especially important if you take diabetes medicines or blood thinners.  Taking medicines such as aspirin and ibuprofen. These medicines can thin your blood. Do not take these medicines before your procedure if your doctor tells you not to.  Plan to have someone take you home from the hospital or clinic.  What happens during the procedure?    You will lie on an exam table with your feet and legs supported as in a pelvic exam.  To lower your risk of infection:  Your health care team will wash or sanitize their hands and put on germ-free (sterile) gloves.  Your genital area will be washed with soap.  An IV tube will be inserted into one of your veins.  You will be given a medicine to help you relax (sedative).  A surgical instrument with a light and camera (resectoscope) will be inserted into your vagina and moved into your uterus. This allows your surgeon to see inside your uterus.  Endometrial tissue will be removed using one of the following methods:  Radiofrequency. This method uses a radiofrequency-alternating electric current to remove the endometrium.  Cryotherapy. This method uses extreme cold to freeze the endometrium.  Heated-free liquid. This method uses a heated saltwater (saline) solution to remove the endometrium.  Microwave. This method uses high-energy microwaves to heat up the endometrium and remove it.  Thermal balloon. This method involves inserting a catheter with a balloon tip into   the uterus. The balloon tip is filled with heated fluid to remove the endometrium.  The procedure may vary among health care providers and hospitals.  What happens after the procedure?  Your blood pressure, heart rate, breathing rate, and blood oxygen level will be monitored until the medicines you were given have worn off.  As tissue healing occurs, you may notice vaginal bleeding for 4-6 weeks after the procedure. You may also  experience:  Cramps.  Thin, watery vaginal discharge that is light pink or brown in color.  A need to urinate more frequently than usual.  Nausea.  Do not drive for 24 hours if you were given a sedative.  Do not have sex or insert anything into your vagina until your health care provider approves.  Summary  Endometrial ablation is done to treat the many causes of heavy menstrual bleeding.  The procedure may be done only after medications have been tried to control the bleeding.  Plan to have someone take you home from the hospital or clinic.  This information is not intended to replace advice given to you by your health care provider. Make sure you discuss any questions you have with your health care provider.  Document Revised: 03/14/2018 Document Reviewed: 10/14/2016  Elsevier Patient Education  2020 Elsevier Inc[employer]

## 2020-02-22 ENCOUNTER — Encounter: Payer: Self-pay | Admitting: Obstetrics and Gynecology

## 2020-02-22 LAB — SURGICAL PATHOLOGY

## 2020-02-23 NOTE — H&P (Signed)
GYNECOLOGY PREOPERATIVE HISTORY AND PHYSICAL  Subjective:  Julia Lynch is a 44 y.o. [phone] here for surgical management of menorrhagia x 6 months. Patient with medical history significant for PCOS, DM, HTN, morbid obesity.   Proposed surgery: Hysteroscopy D&C, endometrial ablation ([place]).   Pertinent Gynecological History: Menses: flow is excessive with use of 4-6 pads or tampons on heaviest days Bleeding: dysfunctional uterine bleeding Contraception: vasectomy Last mammogram: normal Date: 2018 Last pap: normal Date: 11/09/2018   Past Medical History:  Diagnosis Date  . Anxiety   . Arthritis   . Depression   . Diabetic acidosis, type II (HCC)   . GERD (gastroesophageal reflux disease)   . History of chicken pox   . PCOS (polycystic ovarian syndrome)   . Vitamin D deficiency disease    Past Surgical History:  Procedure Laterality Date  . left ankle repair  1995    OB History  Gravida Para Term Preterm AB Living  4 3 3   1 3   SAB TAB Ectopic Multiple Live Births  1       3    # Outcome Date GA Lbr Len/2nd Weight Sex Delivery Anes PTL Lv  4 Term 05/20/12   7 lb (3.175 kg) M Vag-Spont  N LIV  3 Term 01/04/11   9 lb 3 oz (4.167 kg) M Vag-Spont  N LIV  2 Term 08/29/06   7 lb 2 oz (3.232 kg) M Vag-Spont  N LIV  1 SAB 1998            Family History  Problem Relation Age of Onset  . Diabetes Father   . Hypertension Father   . Hepatitis C Mother   . Heart attack Maternal Grandfather   . Hypertension Paternal Grandmother   . Diabetes Paternal Grandmother     Social History   Socioeconomic History  . Marital status: Married    Spouse name: Not on file  . Number of children: 3  . Years of education: Not on file  . Highest education level: Not on file  Occupational History  . Not on file  Tobacco Use  . Smoking status: Former Smoker    Years: 10.00    Quit date: 10/11/2004    Years since quitting: 15.3  . Smokeless tobacco: Never Used  Substance  and Sexual Activity  . Alcohol use: Yes    Alcohol/week: 1.0 standard drinks    Types: 1 Standard drinks or equivalent per week    Comment: Saturdays  . Drug use: No  . Sexual activity: Yes    Comment: husband-vasectomy  Other Topics Concern  . Not on file  Social History Narrative  . Not on file   Social Determinants of Health   Financial Resource Strain:   . Difficulty of Paying Living Expenses:   Food Insecurity:   . Worried About 01-06-1991 in the Last Year:   . Programme researcher, broadcasting/film/video in the Last Year:   Transportation Needs:   . Barista (Medical):   Freight forwarder Lack of Transportation (Non-Medical):   Physical Activity:   . Days of Exercise per Week:   . Minutes of Exercise per Session:   Stress:   . Feeling of Stress :   Social Connections:   . Frequency of Communication with Friends and Family:   . Frequency of Social Gatherings with Friends and Family:   . Attends Religious Services:   . Active Member of Clubs or Organizations:   .  Attends Club or Organization Meetings:   . Marital Status:   Intimate Partner Violence:   . Fear of Current or Ex-Partner:   . Emotionally Abused:   . Physically Abused:   . Sexually Abused:     Current Outpatient Medications on File Prior to Visit  Medication Sig Dispense Refill  . citalopram (CELEXA) 40 MG tablet TAKE 1 TABLET BY MOUTH EVERY DAY 30 tablet 11  . cyclobenzaprine (FLEXERIL) 5 MG tablet Take 1 tablet (5 mg total) by mouth 3 (three) times daily as needed for muscle spasms. 30 tablet 1  . diazepam (VALIUM) 5 MG tablet TAKE AS NEEDED FOR HEADACHES, NO MORE THAN 3 IN A DAY 30 tablet 5  . diphenoxylate-atropine (LOMOTIL) 2.5-0.025 MG tablet Take 1 tablet by mouth 4 (four) times daily. 120 tablet 1  . hydrochlorothiazide (HYDRODIURIL) 25 MG tablet TAKE 1 TABLET BY MOUTH EVERY DAY 30 tablet 11  . metFORMIN (GLUCOPHAGE-XR) 500 MG 24 hr tablet TAKE 2 TABLETS BY MOUTH EVERY DAY WITH BREAKFAST 60 tablet 0  . omeprazole  (PRILOSEC) 20 MG capsule TAKE 1 CAPSULE BY MOUTH EVERY DAY**NEEDS REFILLS* 90 capsule 2  . triamcinolone cream (KENALOG) 0.1 % Apply 1 application topically 2 (two) times daily. 30 g 0  . valACYclovir (VALTREX) 500 MG tablet Take 1 tablet (500 mg total) by mouth daily. Can increase to twice a day for 5 days in the event of a recurrence 30 tablet 12  . Vitamin D, Ergocalciferol, (DRISDOL) 1.25 MG (50000 UT) CAPS capsule TAKE 1 CAPSULE (50,000 UNITS TOTAL) BY MOUTH 2 (TWO) TIMES A WEEK. 8 capsule 7  . [DISCONTINUED] citalopram (CELEXA) 40 MG tablet     . [DISCONTINUED] hydrochlorothiazide (HYDRODIURIL) 25 MG tablet TAKE 1 TABLET BY MOUTH EVERY DAY 90 tablet 3   No current facility-administered medications on file prior to visit.   Allergies  Allergen Reactions  . Ibuprofen     Other reaction(s): Other (See Comments) GI Upset  . Lodine [Etodolac]      Review of Systems Constitutional: No recent fever/chills/sweats Respiratory: No recent cough/bronchitis Cardiovascular: No chest pain Gastrointestinal: No recent nausea/vomiting/diarrhea Genitourinary: No UTI symptoms Hematologic/lymphatic:No history of coagulopathy or recent blood thinner use    Objective:   Blood pressure (!) 126/94, pulse 88, height 5' 1" (1.549 m), weight 299 lb 9.6 oz (135.9 kg), last menstrual period 02/09/2020. CONSTITUTIONAL: Well-developed, well-nourished female in no acute distress.  HENT:  Normocephalic, atraumatic, External right and left ear normal. Oropharynx is clear and moist EYES: Conjunctivae and EOM are normal. Pupils are equal, round, and reactive to light. No scleral icterus.  NECK: Normal range of motion, supple, no masses SKIN: Skin is warm and dry. No rash noted. Not diaphoretic. No erythema. No pallor. NEUROLOGIC: Alert and oriented to person, place, and time. Normal reflexes, muscle tone coordination. No cranial nerve deficit noted. PSYCHIATRIC: Normal mood and affect. Normal behavior. Normal  judgment and thought content. CARDIOVASCULAR: Normal heart rate noted, regular rhythm RESPIRATORY: Effort and breath sounds normal, no problems with respiration noted ABDOMEN: Soft, nontender, nondistended. PELVIC: Deferred MUSCULOSKELETAL: Normal range of motion. No edema and no tenderness. 2+ distal pulses.    Labs: Results for orders placed or performed in visit on 02/20/20 (from the past 336 hour(s))  Surgical pathology   Collection Time: 02/20/20  5:29 PM  Result Value Ref Range   SURGICAL PATHOLOGY      SURGICAL PATHOLOGY CASE: [id number] PATIENT: Legaci Turnbough Surgical Pathology Report     Clinical History:   Menorrhagia with regular cycle (nt)   FINAL MICROSCOPIC DIAGNOSIS:  A. ENDOMETRIUM, BIOPSY: -  Proliferative endometrium -  No hyperplasia or malignancy identified  GROSS DESCRIPTION:  Received in formalin are 1.5 x 1.5 x 0.3 cm of soft tan-pink tissue and blood-tinged mucus.  The specimen is submitted in toto.  [facility] 02/21/2020)   Final Diagnosis performed by Thressa Sheller, MD.   Electronically signed 02/22/2020 Technical and / or Professional components performed at [facility]. [facility], [place] [address], [place], [place] [address].  Immunohistochemistry Technical component (if applicable) was performed at [facility]. [address] [place], [place], [place] [address].   IMMUNOHISTOCHEMISTRY DISCLAIMER (if applicable): Some of these immunohistochemical stains may have been developed and the performance characteristics d etermine by [facility]. Some may not have been cleared or approved by the U.S. Food and Drug Administration. The FDA has determined that such clearance or approval is not necessary. This test is used for clinical purposes. It should not be regarded as investigational or for research. This laboratory is certified under the [place] (CLIA-88) as qualified  to perform high complexity clinical laboratory testing.  The controls stained appropriately.     Lab Results  Component Value Date   WBC 8.1 01/21/2020   HGB 14.0 01/21/2020   HCT 42.0 01/21/2020   MCV 83 01/21/2020   PLT 297 01/21/2020    Lab Results  Component Value Date   TSH 0.759 01/21/2020     Imaging Studies: US PELVIS (TRANSABDOMINAL ONLY)  Result Date: 02/12/2020 Patient Name: NAIYA CORRAL DOB: 10-02-1976 MRN: [id number] ULTRASOUND REPORT Location: [facility] Date of Service: 02/07/2020 Indications:AUB Findings: The uterus is anteverted and measures 8.6 x 4.4 x 5.0 cm. Echo texture is homogenous without evidence of focal masses. The Endometrium measures 27 mm. Right Ovary measures 2.6 x 1.3 x 1.9 cm. It is normal in appearance. Left Ovary measures 2.1 x 1.6 x 1.5 cm. It is normal in appearance. Survey of the adnexa demonstrates no adnexal masses. There is no free fluid in the cul de sac. Impression: 1. Pelvic ultrasound is WNL at this time. Recommendations: 1.Clinical correlation with the patient's History and Physical Exam. Jenine M. Albertine Grates    RDMS The ultrasound images and findings were reviewed by me and I agree with the above report. Finis Bud, M.D. 02/12/2020 11:32 AM   Assessment:    1. Menorrhagia with regular cycle   2. Other specified diabetes mellitus without complication, without long-term current use of insulin ([place])   3. Benign essential HTN   4. PCOS (polycystic ovarian syndrome)   5. Morbid obesity with BMI of 50.0-59.9, adult ([place])     Plan:    Counseling: Procedure, risks, reasons, benefits and complications (including injury to bowel, bladder, major blood vessels, bleeding, possibility of transfusion, infection) reviewed in detail. Likelihood of success in alleviating the patient's condition was discussed. Routine postoperative instructions will be reviewed with the patient and her family in detail after surgery.  The patient concurred with  the proposed plan, giving informed written consent for the surgery.   Preop testing ordered. Instructions reviewed, including NPO after midnight. Hypertension and DM managed by PCP.    Rubie Maid, MD [facility]

## 2020-02-23 NOTE — H&P (View-Only) (Signed)
GYNECOLOGY PREOPERATIVE HISTORY AND PHYSICAL  Subjective:  Julia Lynch is a 44 y.o. [phone] here for surgical management of menorrhagia x 6 months. Patient with medical history significant for PCOS, DM, HTN, morbid obesity.   Proposed surgery: Hysteroscopy D&C, endometrial ablation ([facility]).   Pertinent Gynecological History: Menses: flow is excessive with use of 4-6 pads or tampons on heaviest days Bleeding: dysfunctional uterine bleeding Contraception: vasectomy Last mammogram: normal Date: 2018 Last pap: normal Date: 11/09/2018   Past Medical History:  Diagnosis Date  . Anxiety   . Arthritis   . Depression   . Diabetic acidosis, type II (HCC)   . GERD (gastroesophageal reflux disease)   . History of chicken pox   . PCOS (polycystic ovarian syndrome)   . Vitamin D deficiency disease    Past Surgical History:  Procedure Laterality Date  . left ankle repair  1995    OB History  Gravida Para Term Preterm AB Living  4 3 3   1 3   SAB TAB Ectopic Multiple Live Births  1       3    # Outcome Date GA Lbr Len/2nd Weight Sex Delivery Anes PTL Lv  4 Term 05/20/12   7 lb (3.175 kg) M Vag-Spont  N LIV  3 Term 01/04/11   9 lb 3 oz (4.167 kg) M Vag-Spont  N LIV  2 Term 08/29/06   7 lb 2 oz (3.232 kg) M Vag-Spont  N LIV  1 SAB 1998            Family History  Problem Relation Age of Onset  . Diabetes Father   . Hypertension Father   . Hepatitis C Mother   . Heart attack Maternal Grandfather   . Hypertension Paternal Grandmother   . Diabetes Paternal Grandmother     Social History   Socioeconomic History  . Marital status: Married    Spouse name: Not on file  . Number of children: 3  . Years of education: Not on file  . Highest education level: Not on file  Occupational History  . Not on file  Tobacco Use  . Smoking status: Former Smoker    Years: 10.00    Quit date: 10/11/2004    Years since quitting: 15.3  . Smokeless tobacco: Never Used  Substance  and Sexual Activity  . Alcohol use: Yes    Alcohol/week: 1.0 standard drinks    Types: 1 Standard drinks or equivalent per week    Comment: Saturdays  . Drug use: No  . Sexual activity: Yes    Comment: husband-vasectomy  Other Topics Concern  . Not on file  Social History Narrative  . Not on file   Social Determinants of Health   Financial Resource Strain:   . Difficulty of Paying Living Expenses:   Food Insecurity:   . Worried About 01-06-1991 in the Last Year:   . Programme researcher, broadcasting/film/video in the Last Year:   Transportation Needs:   . Barista (Medical):   Freight forwarder Lack of Transportation (Non-Medical):   Physical Activity:   . Days of Exercise per Week:   . Minutes of Exercise per Session:   Stress:   . Feeling of Stress :   Social Connections:   . Frequency of Communication with Friends and Family:   . Frequency of Social Gatherings with Friends and Family:   . Attends Religious Services:   . Active Member of Clubs or Organizations:   .  Attends Banker Meetings:   Marland Kitchen Marital Status:   Intimate Partner Violence:   . Fear of Current or Ex-Partner:   . Emotionally Abused:   Marland Kitchen Physically Abused:   . Sexually Abused:     Current Outpatient Medications on File Prior to Visit  Medication Sig Dispense Refill  . citalopram (CELEXA) 40 MG tablet TAKE 1 TABLET BY MOUTH EVERY DAY 30 tablet 11  . cyclobenzaprine (FLEXERIL) 5 MG tablet Take 1 tablet (5 mg total) by mouth 3 (three) times daily as needed for muscle spasms. 30 tablet 1  . diazepam (VALIUM) 5 MG tablet TAKE AS NEEDED FOR HEADACHES, NO MORE THAN 3 IN A DAY 30 tablet 5  . diphenoxylate-atropine (LOMOTIL) 2.5-0.025 MG tablet Take 1 tablet by mouth 4 (four) times daily. 120 tablet 1  . hydrochlorothiazide (HYDRODIURIL) 25 MG tablet TAKE 1 TABLET BY MOUTH EVERY DAY 30 tablet 11  . metFORMIN (GLUCOPHAGE-XR) 500 MG 24 hr tablet TAKE 2 TABLETS BY MOUTH EVERY DAY WITH BREAKFAST 60 tablet 0  . omeprazole  (PRILOSEC) 20 MG capsule TAKE 1 CAPSULE BY MOUTH EVERY DAY**NEEDS REFILLS* 90 capsule 2  . triamcinolone cream (KENALOG) 0.1 % Apply 1 application topically 2 (two) times daily. 30 g 0  . valACYclovir (VALTREX) 500 MG tablet Take 1 tablet (500 mg total) by mouth daily. Can increase to twice a day for 5 days in the event of a recurrence 30 tablet 12  . Vitamin D, Ergocalciferol, (DRISDOL) 1.25 MG (50000 UT) CAPS capsule TAKE 1 CAPSULE (50,000 UNITS TOTAL) BY MOUTH 2 (TWO) TIMES A WEEK. 8 capsule 7  . [DISCONTINUED] citalopram (CELEXA) 40 MG tablet     . [DISCONTINUED] hydrochlorothiazide (HYDRODIURIL) 25 MG tablet TAKE 1 TABLET BY MOUTH EVERY DAY 90 tablet 3   No current facility-administered medications on file prior to visit.   Allergies  Allergen Reactions  . Ibuprofen     Other reaction(s): Other (See Comments) GI Upset  . Lodine [Etodolac]      Review of Systems Constitutional: No recent fever/chills/sweats Respiratory: No recent cough/bronchitis Cardiovascular: No chest pain Gastrointestinal: No recent nausea/vomiting/diarrhea Genitourinary: No UTI symptoms Hematologic/lymphatic:No history of coagulopathy or recent blood thinner use    Objective:   Blood pressure (!) 126/94, pulse 88, height 5\' 1"  (1.549 m), weight 299 lb 9.6 oz (135.9 kg), last menstrual period 02/09/2020. CONSTITUTIONAL: Well-developed, well-nourished female in no acute distress.  HENT:  Normocephalic, atraumatic, External right and left ear normal. Oropharynx is clear and moist EYES: Conjunctivae and EOM are normal. Pupils are equal, round, and reactive to light. No scleral icterus.  NECK: Normal range of motion, supple, no masses SKIN: Skin is warm and dry. No rash noted. Not diaphoretic. No erythema. No pallor. NEUROLOGIC: Alert and oriented to person, place, and time. Normal reflexes, muscle tone coordination. No cranial nerve deficit noted. PSYCHIATRIC: Normal mood and affect. Normal behavior. Normal  judgment and thought content. CARDIOVASCULAR: Normal heart rate noted, regular rhythm RESPIRATORY: Effort and breath sounds normal, no problems with respiration noted ABDOMEN: Soft, nontender, nondistended. PELVIC: Deferred MUSCULOSKELETAL: Normal range of motion. No edema and no tenderness. 2+ distal pulses.    Labs: Results for orders placed or performed in visit on 02/20/20 (from the past 336 hour(s))  Surgical pathology   Collection Time: 02/20/20  5:29 PM  Result Value Ref Range   SURGICAL PATHOLOGY      SURGICAL PATHOLOGY CASE: [id number] PATIENT: 06-11-1976 Surgical Pathology Report     Clinical History:  Menorrhagia with regular cycle (nt)   FINAL MICROSCOPIC DIAGNOSIS:  A. ENDOMETRIUM, BIOPSY: -  Proliferative endometrium -  No hyperplasia or malignancy identified  GROSS DESCRIPTION:  Received in formalin are 1.5 x 1.5 x 0.3 cm of soft tan-pink tissue and blood-tinged mucus.  The specimen is submitted in toto.  [facility] 02/21/2020)   Final Diagnosis performed by Thressa Sheller, MD.   Electronically signed 02/22/2020 Technical and / or Professional components performed at [facility]. [facility], [place] [address], [place], [place] [address].  Immunohistochemistry Technical component (if applicable) was performed at [facility]. [address] [place], [place], [place] [address].   IMMUNOHISTOCHEMISTRY DISCLAIMER (if applicable): Some of these immunohistochemical stains may have been developed and the performance characteristics d etermine by [facility]. Some may not have been cleared or approved by the U.S. Food and Drug Administration. The FDA has determined that such clearance or approval is not necessary. This test is used for clinical purposes. It should not be regarded as investigational or for research. This laboratory is certified under the [place] (CLIA-88) as qualified  to perform high complexity clinical laboratory testing.  The controls stained appropriately.     Lab Results  Component Value Date   WBC 8.1 01/21/2020   HGB 14.0 01/21/2020   HCT 42.0 01/21/2020   MCV 83 01/21/2020   PLT 297 01/21/2020    Lab Results  Component Value Date   TSH 0.759 01/21/2020     Imaging Studies: US PELVIS (TRANSABDOMINAL ONLY)  Result Date: 02/12/2020 Patient Name: NAIYA CORRAL DOB: 10-02-1976 MRN: [id number] ULTRASOUND REPORT Location: [facility] Date of Service: 02/07/2020 Indications:AUB Findings: The uterus is anteverted and measures 8.6 x 4.4 x 5.0 cm. Echo texture is homogenous without evidence of focal masses. The Endometrium measures 27 mm. Right Ovary measures 2.6 x 1.3 x 1.9 cm. It is normal in appearance. Left Ovary measures 2.1 x 1.6 x 1.5 cm. It is normal in appearance. Survey of the adnexa demonstrates no adnexal masses. There is no free fluid in the cul de sac. Impression: 1. Pelvic ultrasound is WNL at this time. Recommendations: 1.Clinical correlation with the patient's History and Physical Exam. Jenine M. Albertine Grates    RDMS The ultrasound images and findings were reviewed by me and I agree with the above report. Finis Bud, M.D. 02/12/2020 11:32 AM   Assessment:    1. Menorrhagia with regular cycle   2. Other specified diabetes mellitus without complication, without long-term current use of insulin ([place])   3. Benign essential HTN   4. PCOS (polycystic ovarian syndrome)   5. Morbid obesity with BMI of 50.0-59.9, adult ([place])     Plan:    Counseling: Procedure, risks, reasons, benefits and complications (including injury to bowel, bladder, major blood vessels, bleeding, possibility of transfusion, infection) reviewed in detail. Likelihood of success in alleviating the patient's condition was discussed. Routine postoperative instructions will be reviewed with the patient and her family in detail after surgery.  The patient concurred with  the proposed plan, giving informed written consent for the surgery.   Preop testing ordered. Instructions reviewed, including NPO after midnight. Hypertension and DM managed by PCP.    Rubie Maid, MD [facility]

## 2020-02-23 NOTE — Addendum Note (Signed)
Addended by: Fabian November on: 02/23/2020 01:43 AM   Modules accepted: Orders, SmartSet

## 2020-03-04 ENCOUNTER — Encounter
Admission: RE | Admit: 2020-03-04 | Discharge: 2020-03-04 | Disposition: A | Discharge status: Home / Self Care | Payer: BC Managed Care – PPO | Source: Ambulatory Visit | Attending: Obstetrics and Gynecology | Admitting: Obstetrics and Gynecology

## 2020-03-04 ENCOUNTER — Other Ambulatory Visit: Payer: Self-pay

## 2020-03-04 HISTORY — DX: Personal history of urinary calculi: Z87.442

## 2020-03-04 HISTORY — DX: Essential (primary) hypertension: I10

## 2020-03-04 HISTORY — DX: Headache, unspecified: R51.9

## 2020-03-04 HISTORY — DX: Sleep apnea, unspecified: G47.30

## 2020-03-04 NOTE — Patient Instructions (Signed)
Your procedure is scheduled on: 03-07-20 FRIDAY Report to Same Day Surgery 2nd floor medical mall [facility] Entrance-take elevator on left to 2nd floor.  Check in with surgery information desk.) To find out your arrival time please call [phone] between 1PM - 3PM on 03-06-20 THURSDAY  Remember: Instructions that are not followed completely may result in serious medical risk, up to and including death, or upon the discretion of your surgeon and anesthesiologist your surgery may need to be rescheduled.    _x___ 1. Do not eat food after midnight the night before your procedure. NO GUM OR CANDY AFTER MIDNIGHT. You may drink WATER up to 2 hours before you are scheduled to arrive at the hospital for your procedure.  Do not drink WATER within 2 hours of your scheduled arrival to the hospital.  Type 1 and type 2 diabetics should only drink water.   ____Ensure clear carbohydrate drink on the way to the hospital for bariatric patients  ____Ensure clear carbohydrate drink 3 hours before surgery.     __x__ 2. No Alcohol for 24 hours before or after surgery.   __x__3. No Smoking or e-cigarettes for 24 prior to surgery.  Do not use any chewable tobacco products for at least 6 hour prior to surgery   ____  4. Bring all medications with you on the day of surgery if instructed.    __x__ 5. Notify your doctor if there is any change in your medical condition     (cold, fever, infections).    x___6. On the morning of surgery brush your teeth with toothpaste and water.  You may rinse your mouth with mouth wash if you wish.  Do not swallow any toothpaste or mouthwash.   Do not wear jewelry, make-up, hairpins, clips or nail polish.  Do not wear lotions, powders, or perfumes. You may wear deodorant.  Do not shave 48 hours prior to surgery. Men may shave face and neck.  Do not bring valuables to the hospital.    [facility] is not responsible for any belongings or valuables.               Contacts,  dentures or bridgework may not be worn into surgery.  Leave your suitcase in the car. After surgery it may be brought to your room.  For patients admitted to the hospital, discharge time is determined by your treatment team.  _  Patients discharged the day of surgery will not be allowed to drive home.  You will need someone to drive you home and stay with you the night of your procedure.    Please read over the following fact sheets that you were given:   [facility] Preparing for Surgery   _x___ TAKE THE FOLLOWING MEDICATION THE MORNING OF SURGERY WITH A SMALL SIP OF WATER. These include:  1. CELEXA (CITALOPRAM)  2. MAGNESIUM OXIDE  3. PRILOSEC (OMEPRAZOLE)  4. TAKE AN EXTRA PRILOSEC THE NIGHT BEFORE YOUR SURGERY  5.  6.  ____Fleets enema or Magnesium Citrate as directed.   ____ Use CHG Soap or sage wipes as directed on instruction sheet   ____ Use inhalers on the day of surgery and bring to hospital day of surgery  _X___ Stop Metformin 2 days prior to surgery-LAST DOSE TODAY (03-04-20)    ____ Take 1/2 of usual insulin dose the night before surgery and none on the morning surgery.   _x___ Follow recommendations from Cardiologist, Pulmonologist or PCP regarding stopping Aspirin, Coumadin, Plavix ,Eliquis,  Effient, or Pradaxa, and Pletal.  X____Stop Anti-inflammatories such as Advil, Aleve, Ibuprofen, Motrin, Naproxen, Naprosyn, Goodies powders or aspirin products NOW-OK to take Tylenol    _x___ Stop supplements until after surgery-STOP BIOTIN NOW-YOU MAY RESUME AFTER YOUR SURGERY   _X___ Bring C-Pap to the hospital.

## 2020-03-05 ENCOUNTER — Encounter
Admission: RE | Admit: 2020-03-05 | Discharge: 2020-03-05 | Disposition: A | Discharge status: Home / Self Care | Payer: BC Managed Care – PPO | Source: Ambulatory Visit | Attending: Obstetrics and Gynecology | Admitting: Obstetrics and Gynecology

## 2020-03-05 ENCOUNTER — Other Ambulatory Visit: Payer: BC Managed Care – PPO

## 2020-03-05 DIAGNOSIS — I10 Essential (primary) hypertension: Secondary | ICD-10-CM | POA: Diagnosis not present

## 2020-03-05 DIAGNOSIS — Z20822 Contact with and (suspected) exposure to covid-19: Secondary | ICD-10-CM | POA: Diagnosis not present

## 2020-03-05 DIAGNOSIS — Z01818 Encounter for other preprocedural examination: Secondary | ICD-10-CM | POA: Insufficient documentation

## 2020-03-05 LAB — HEMOGLOBIN A1C
Hgb A1c MFr Bld: 8.3 % — ABNORMAL HIGH (ref 4.8–5.6)
Mean Plasma Glucose: 191.51 mg/dL

## 2020-03-05 LAB — SARS CORONAVIRUS 2 (TAT 6-24 HRS): SARS Coronavirus 2: NEGATIVE

## 2020-03-05 LAB — BASIC METABOLIC PANEL
Anion gap: 10 (ref 5–15)
BUN: 12 mg/dL (ref 6–20)
CO2: 24 mmol/L (ref 22–32)
Calcium: 8.9 mg/dL (ref 8.9–10.3)
Chloride: 98 mmol/L (ref 98–111)
Creatinine, Ser: 0.78 mg/dL (ref 0.44–1.00)
GFR calc Af Amer: >60 mL/min (ref >60)
GFR calc non Af Amer: >60 mL/min (ref >60)
Glucose, Bld: 290 mg/dL — ABNORMAL HIGH (ref 70–99)
Potassium: 3.8 mmol/L (ref 3.5–5.1)
Sodium: 132 mmol/L — ABNORMAL LOW (ref 135–145)

## 2020-03-05 LAB — CBC
HCT: 41.3 % (ref 36.0–46.0)
Hemoglobin: 14.5 g/dL (ref 12.0–15.0)
MCH: 28 pg (ref 26.0–34.0)
MCHC: 35.1 g/dL (ref 30.0–36.0)
MCV: 79.7 fL — ABNORMAL LOW (ref 80.0–100.0)
Platelets: 285 10*3/uL (ref 150–400)
RBC: 5.18 MIL/uL — ABNORMAL HIGH (ref 3.87–5.11)
RDW: 13.2 % (ref 11.5–15.5)
WBC: 8.6 10*3/uL (ref 4.0–10.5)
nRBC: 0 % (ref 0.0–0.2)

## 2020-03-07 ENCOUNTER — Ambulatory Visit
Admission: RE | Admit: 2020-03-07 | Discharge: 2020-03-07 | Disposition: A | Discharge status: Home / Self Care | Payer: BC Managed Care – PPO | Attending: Obstetrics and Gynecology | Admitting: Obstetrics and Gynecology

## 2020-03-07 ENCOUNTER — Encounter
Admission: RE | Disposition: A | Discharge status: Home / Self Care | Payer: Self-pay | Source: Home / Self Care | Attending: Obstetrics and Gynecology

## 2020-03-07 ENCOUNTER — Ambulatory Visit: Payer: BC Managed Care – PPO | Admitting: Anesthesiology

## 2020-03-07 ENCOUNTER — Other Ambulatory Visit: Payer: Self-pay

## 2020-03-07 ENCOUNTER — Encounter: Payer: Self-pay | Admitting: Obstetrics and Gynecology

## 2020-03-07 DIAGNOSIS — Z87891 Personal history of nicotine dependence: Secondary | ICD-10-CM | POA: Insufficient documentation

## 2020-03-07 DIAGNOSIS — K219 Gastro-esophageal reflux disease without esophagitis: Secondary | ICD-10-CM | POA: Diagnosis not present

## 2020-03-07 DIAGNOSIS — M1990 Unspecified osteoarthritis, unspecified site: Secondary | ICD-10-CM | POA: Insufficient documentation

## 2020-03-07 DIAGNOSIS — Z79899 Other long term (current) drug therapy: Secondary | ICD-10-CM | POA: Diagnosis not present

## 2020-03-07 DIAGNOSIS — N920 Excessive and frequent menstruation with regular cycle: Secondary | ICD-10-CM | POA: Diagnosis present

## 2020-03-07 DIAGNOSIS — E119 Type 2 diabetes mellitus without complications: Secondary | ICD-10-CM | POA: Diagnosis not present

## 2020-03-07 DIAGNOSIS — F419 Anxiety disorder, unspecified: Secondary | ICD-10-CM | POA: Insufficient documentation

## 2020-03-07 DIAGNOSIS — Z9889 Other specified postprocedural states: Secondary | ICD-10-CM

## 2020-03-07 DIAGNOSIS — Z833 Family history of diabetes mellitus: Secondary | ICD-10-CM | POA: Diagnosis not present

## 2020-03-07 DIAGNOSIS — E282 Polycystic ovarian syndrome: Secondary | ICD-10-CM | POA: Diagnosis not present

## 2020-03-07 DIAGNOSIS — Z6841 Body Mass Index (BMI) 40.0 and over, adult: Secondary | ICD-10-CM | POA: Insufficient documentation

## 2020-03-07 DIAGNOSIS — F329 Major depressive disorder, single episode, unspecified: Secondary | ICD-10-CM | POA: Insufficient documentation

## 2020-03-07 DIAGNOSIS — E559 Vitamin D deficiency, unspecified: Secondary | ICD-10-CM | POA: Insufficient documentation

## 2020-03-07 DIAGNOSIS — Z7984 Long term (current) use of oral hypoglycemic drugs: Secondary | ICD-10-CM | POA: Diagnosis not present

## 2020-03-07 DIAGNOSIS — I10 Essential (primary) hypertension: Secondary | ICD-10-CM | POA: Diagnosis not present

## 2020-03-07 DIAGNOSIS — Z8249 Family history of ischemic heart disease and other diseases of the circulatory system: Secondary | ICD-10-CM | POA: Insufficient documentation

## 2020-03-07 HISTORY — PX: ENDOMETRIAL ABLATION: SHX621

## 2020-03-07 HISTORY — PX: HYSTEROSCOPY WITH D & C: SHX1775

## 2020-03-07 LAB — POCT PREGNANCY, URINE: Preg Test, Ur: NEGATIVE

## 2020-03-07 LAB — GLUCOSE, CAPILLARY
Glucose-Capillary: 218 mg/dL — ABNORMAL HIGH (ref 70–99)
Glucose-Capillary: 146 mg/dL — ABNORMAL HIGH (ref 70–99)

## 2020-03-07 SURGERY — DILATATION AND CURETTAGE /HYSTEROSCOPY
Anesthesia: General | Laterality: Bilateral

## 2020-03-07 MED ORDER — CHLORHEXIDINE GLUCONATE 0.12 % MT SOLN: 15.0000 mL | Freq: Once | OROMUCOSAL | Status: AC

## 2020-03-07 MED ORDER — FENTANYL CITRATE (PF) 100 MCG/2ML IJ SOLN
INTRAMUSCULAR | Status: AC
  Filled 2020-03-07: qty 2

## 2020-03-07 MED ORDER — CHLORHEXIDINE GLUCONATE 0.12 % MT SOLN
OROMUCOSAL | Status: AC
  Administered 2020-03-07: 15 mL via OROMUCOSAL
  Filled 2020-03-07: qty 15

## 2020-03-07 MED ORDER — ONDANSETRON HCL 4 MG/2ML IJ SOLN
INTRAMUSCULAR | Status: AC
  Filled 2020-03-07: qty 2

## 2020-03-07 MED ORDER — OXYCODONE HCL 5 MG PO TABS
ORAL_TABLET | ORAL | Status: AC
  Filled 2020-03-07: qty 1

## 2020-03-07 MED ORDER — MIDAZOLAM HCL 2 MG/2ML IJ SOLN
INTRAMUSCULAR | Status: DC | PRN
  Administered 2020-03-07: 2 mg via INTRAVENOUS

## 2020-03-07 MED ORDER — PHENYLEPHRINE HCL (PRESSORS) 10 MG/ML IV SOLN: INTRAVENOUS | Status: DC | PRN

## 2020-03-07 MED ORDER — FENTANYL CITRATE (PF) 100 MCG/2ML IJ SOLN
INTRAMUSCULAR | Status: DC | PRN
  Administered 2020-03-07 (×2): 50 ug via INTRAVENOUS

## 2020-03-07 MED ORDER — FAMOTIDINE 20 MG PO TABS
ORAL_TABLET | ORAL | Status: AC
  Administered 2020-03-07: 20 mg via ORAL
  Filled 2020-03-07: qty 1

## 2020-03-07 MED ORDER — SUGAMMADEX SODIUM 200 MG/2ML IV SOLN
INTRAVENOUS | Status: DC | PRN
  Administered 2020-03-07: 200 mg via INTRAVENOUS

## 2020-03-07 MED ORDER — ONDANSETRON HCL 4 MG/2ML IJ SOLN
INTRAMUSCULAR | Status: DC | PRN
  Administered 2020-03-07: 4 mg via INTRAVENOUS

## 2020-03-07 MED ORDER — SUCCINYLCHOLINE CHLORIDE 20 MG/ML IJ SOLN
INTRAMUSCULAR | Status: DC | PRN
  Administered 2020-03-07: 140 mg via INTRAVENOUS

## 2020-03-07 MED ORDER — DEXAMETHASONE SODIUM PHOSPHATE 10 MG/ML IJ SOLN
INTRAMUSCULAR | Status: DC | PRN
  Administered 2020-03-07: 5 mg via INTRAVENOUS

## 2020-03-07 MED ORDER — SODIUM CHLORIDE 0.9 % IV SOLN: INTRAVENOUS | Status: DC

## 2020-03-07 MED ORDER — SUCCINYLCHOLINE CHLORIDE 200 MG/10ML IV SOSY
PREFILLED_SYRINGE | INTRAVENOUS | Status: AC
  Filled 2020-03-07: qty 10

## 2020-03-07 MED ORDER — OXYCODONE HCL 5 MG PO TABS
5.0000 mg | ORAL_TABLET | Freq: Once | ORAL | Status: AC
  Administered 2020-03-07: 5 mg via ORAL

## 2020-03-07 MED ORDER — FENTANYL CITRATE (PF) 100 MCG/2ML IJ SOLN
INTRAMUSCULAR | Status: AC
  Administered 2020-03-07: 25 ug via INTRAVENOUS
  Filled 2020-03-07: qty 2

## 2020-03-07 MED ORDER — PROPOFOL 10 MG/ML IV BOLUS
INTRAVENOUS | Status: DC | PRN
  Administered 2020-03-07: 200 mg via INTRAVENOUS

## 2020-03-07 MED ORDER — ROCURONIUM BROMIDE 10 MG/ML (PF) SYRINGE
PREFILLED_SYRINGE | INTRAVENOUS | Status: AC
  Filled 2020-03-07: qty 10

## 2020-03-07 MED ORDER — ACETAMINOPHEN ER 650 MG PO TBCR
650.0000 mg | EXTENDED_RELEASE_TABLET | Freq: Three times a day (TID) | ORAL | 0 refills | Status: DC | PRN
Start: 2020-03-07 — End: 2020-09-02

## 2020-03-07 MED ORDER — PROPOFOL 10 MG/ML IV BOLUS
INTRAVENOUS | Status: AC
  Filled 2020-03-07: qty 20

## 2020-03-07 MED ORDER — ROCURONIUM BROMIDE 100 MG/10ML IV SOLN
INTRAVENOUS | Status: DC | PRN
  Administered 2020-03-07: 30 mg via INTRAVENOUS
  Administered 2020-03-07: 20 mg via INTRAVENOUS

## 2020-03-07 MED ORDER — PROMETHAZINE HCL 25 MG/ML IJ SOLN: 6.2500 mg | INTRAMUSCULAR | Status: DC | PRN

## 2020-03-07 MED ORDER — LIDOCAINE HCL (PF) 2 % IJ SOLN
INTRAMUSCULAR | Status: AC
  Filled 2020-03-07: qty 5

## 2020-03-07 MED ORDER — SILVER NITRATE-POT NITRATE 75-25 % EX MISC
CUTANEOUS | Status: AC
  Filled 2020-03-07: qty 10

## 2020-03-07 MED ORDER — LIDOCAINE HCL (CARDIAC) PF 100 MG/5ML IV SOSY
PREFILLED_SYRINGE | INTRAVENOUS | Status: DC | PRN
  Administered 2020-03-07: 100 mg via INTRAVENOUS

## 2020-03-07 MED ORDER — LACTATED RINGERS IV SOLN: INTRAVENOUS | Status: DC

## 2020-03-07 MED ORDER — FENTANYL CITRATE (PF) 100 MCG/2ML IJ SOLN
25.0000 ug | INTRAMUSCULAR | Status: DC | PRN
  Administered 2020-03-07: 50 ug via INTRAVENOUS
  Administered 2020-03-07: 25 ug via INTRAVENOUS

## 2020-03-07 MED ORDER — MIDAZOLAM HCL 2 MG/2ML IJ SOLN
INTRAMUSCULAR | Status: AC
  Filled 2020-03-07: qty 2

## 2020-03-07 MED ORDER — ORAL CARE MOUTH RINSE: 15.0000 mL | Freq: Once | OROMUCOSAL | Status: AC

## 2020-03-07 MED ORDER — FAMOTIDINE 20 MG PO TABS: 20.0000 mg | ORAL_TABLET | Freq: Once | ORAL | Status: AC

## 2020-03-07 MED ORDER — DOXYCYCLINE HYCLATE 100 MG IV SOLR
200.0000 mg | INTRAVENOUS | Status: DC
  Filled 2020-03-07: qty 200

## 2020-03-07 SURGICAL SUPPLY — 26 items
ABLATOR SURESOUND NOVASURE (ABLATOR) IMPLANT
BAG INFUSER PRESSURE 100CC (MISCELLANEOUS) ×2 IMPLANT
CANISTER SUCT 1200ML W/VALVE (MISCELLANEOUS) ×2 IMPLANT
CATH ROBINSON RED A/P 16FR (CATHETERS) ×2 IMPLANT
COVER WAND RF STERILE (DRAPES) IMPLANT
GLOVE BIO SURGEON STRL SZ 6.5 (GLOVE) ×2 IMPLANT
GLOVE INDICATOR 7.0 STRL GRN (GLOVE) ×2 IMPLANT
GOWN STRL REUS W/ TWL LRG LVL3 (GOWN DISPOSABLE) ×2 IMPLANT
GOWN STRL REUS W/TWL LRG LVL3 (GOWN DISPOSABLE) ×2
HANDPIECE ABLA MINERVA ENDO (MISCELLANEOUS) ×2 IMPLANT
IV LACTATED RINGER IRRG 3000ML (IV SOLUTION)
IV LACTATED RINGERS 1000ML (IV SOLUTION) ×2 IMPLANT
IV LR IRRIG 3000ML ARTHROMATIC (IV SOLUTION) IMPLANT
KIT PROCEDURE FLUENT (KITS) IMPLANT
KIT TURNOVER CYSTO (KITS) ×2 IMPLANT
LABEL OR SOLS (LABEL) IMPLANT
NS IRRIG 500ML POUR BTL (IV SOLUTION) ×2 IMPLANT
PACK DNC HYST (MISCELLANEOUS) ×2 IMPLANT
PAD OB MATERNITY 4.3X12.25 (PERSONAL CARE ITEMS) ×2 IMPLANT
PAD PREP 24X41 OB/GYN DISP (PERSONAL CARE ITEMS) ×2 IMPLANT
SEAL ROD LENS SCOPE MYOSURE (ABLATOR) ×2 IMPLANT
SOL PREP PVP 2OZ (MISCELLANEOUS) ×2
SOLUTION PREP PVP 2OZ (MISCELLANEOUS) ×1 IMPLANT
SURGILUBE 2OZ TUBE FLIPTOP (MISCELLANEOUS) ×2 IMPLANT
TOWEL OR 17X26 4PK STRL BLUE (TOWEL DISPOSABLE) ×2 IMPLANT
TUBING CONNECTING 10 (TUBING) IMPLANT

## 2020-03-07 NOTE — Anesthesia Preprocedure Evaluation (Signed)
Anesthesia Evaluation  Patient identified by MRN, date of birth, ID band Patient awake    Reviewed: Allergy & Precautions, H&P , NPO status , Patient's Chart, lab work & pertinent test results, reviewed documented beta blocker date and time   History of Anesthesia Complications Negative for: history of anesthetic complications  Airway Mallampati: IV  TM Distance: >3 FB Neck ROM: full    Dental  (+) Dental Advidsory Given, Teeth Intact   Pulmonary neg shortness of breath, asthma (when smoking, none since quit) , sleep apnea and Continuous Positive Airway Pressure Ventilation , neg COPD, neg recent URI, former smoker,    Pulmonary exam normal breath sounds clear to auscultation       Cardiovascular Exercise Tolerance: Good hypertension, (-) angina(-) Past MI and (-) Cardiac Stents Normal cardiovascular exam(-) dysrhythmias (-) Valvular Problems/Murmurs Rhythm:regular Rate:Normal     Neuro/Psych PSYCHIATRIC DISORDERS Anxiety Depression negative neurological ROS     GI/Hepatic Neg liver ROS, GERD  ,  Endo/Other  diabetes, Well Controlled, Type 2, Oral Hypoglycemic AgentsMorbid obesity  Renal/GU Renal disease (kidney stones)  negative genitourinary   Musculoskeletal   Abdominal   Peds  Hematology negative hematology ROS (+)   Anesthesia Other Findings Past Medical History: No date: Anxiety No date: Arthritis No date: Asthma     Comment:  h/o when she smoked No date: Depression No date: Diabetic acidosis, type II (HCC) No date: GERD (gastroesophageal reflux disease) No date: Headache No date: History of chicken pox No date: History of kidney stones     Comment:  h/o No date: Hypertension No date: PCOS (polycystic ovarian syndrome) No date: Sleep apnea     Comment:  uses cpap No date: Vitamin D deficiency disease   Reproductive/Obstetrics negative OB ROS                              Anesthesia Physical Anesthesia Plan  ASA: III  Anesthesia Plan: General   Post-op Pain Management:    Induction: Intravenous  PONV Risk Score and Plan: 3 and Dexamethasone, Ondansetron, Midazolam, Promethazine and Treatment may vary due to age or medical condition  Airway Management Planned: Oral ETT  Additional Equipment:   Intra-op Plan:   Post-operative Plan: Extubation in OR  Informed Consent: I have reviewed the patients History and Physical, chart, labs and discussed the procedure including the risks, benefits and alternatives for the proposed anesthesia with the patient or authorized representative who has indicated his/her understanding and acceptance.     Dental Advisory Given  Plan Discussed with: Anesthesiologist, CRNA and Surgeon  Anesthesia Plan Comments:         Anesthesia Quick Evaluation

## 2020-03-07 NOTE — Transfer of Care (Signed)
Immediate Anesthesia Transfer of Care Note  Patient: Julia Lynch  Procedure(s) Performed: DILATATION AND CURETTAGE /HYSTEROSCOPY (Bilateral ) ENDOMETRIAL ABLATION (Bilateral )  Patient Location: PACU  Anesthesia Type:General  Level of Consciousness: sedated  Airway & Oxygen Therapy: Patient Spontanous Breathing  Post-op Assessment: Report given to RN and Post -op Vital signs reviewed and stable  Post vital signs: Reviewed and stable  Last Vitals:  Vitals Value Taken Time  BP 145/70 03/07/20 1206  Temp 36.2 C 03/07/20 1206  Pulse 85 03/07/20 1207  Resp 22 03/07/20 1207  SpO2 94 % 03/07/20 1207  Vitals shown include unvalidated device data.  Last Pain:  Vitals:   03/07/20 1206  TempSrc:   PainSc: 7          Complications: No apparent anesthesia complications

## 2020-03-07 NOTE — Discharge Instructions (Signed)

## 2020-03-07 NOTE — Anesthesia Postprocedure Evaluation (Signed)
Anesthesia Post Note  Patient: FLORENTINA MARQUART  Procedure(s) Performed: DILATATION AND CURETTAGE /HYSTEROSCOPY (Bilateral ) ENDOMETRIAL ABLATION (Bilateral )  Patient location during evaluation: PACU Anesthesia Type: General Level of consciousness: awake and alert Pain management: pain level controlled Vital Signs Assessment: post-procedure vital signs reviewed and stable Respiratory status: spontaneous breathing, nonlabored ventilation, respiratory function stable and patient connected to nasal cannula oxygen Cardiovascular status: blood pressure returned to baseline and stable Postop Assessment: no apparent nausea or vomiting Anesthetic complications: no     Last Vitals:  Vitals:   03/07/20 1400 03/07/20 1422  BP:  (!) 156/85  Pulse: 83 83  Resp: 18 18  Temp: 36.7 C   SpO2: 98% 98%    Last Pain:  Vitals:   03/07/20 1422  TempSrc:   PainSc: 3                  Lenard Simmer

## 2020-03-07 NOTE — Op Note (Signed)
Procedure(s): DILATATION AND CURETTAGE /HYSTEROSCOPY ENDOMETRIAL ABLATION Procedure Note  Julia Lynch female 44 y.o. 03/07/2020  Indications: The patient is a 44 y.o. [phone] female with heavy menstrual bleeding x 6 months, desiring surgical intervention.   Pre-operative Diagnosis: Menorrhagia, PCOS, DM, HTN, morbid obesity (BMI 56)  Post-operative Diagnosis: Same  Surgeon: Hildred Laser, MD  Assistants:  None.   Anesthesia: General endotracheal anesthesia  Procedure Details: The patient was seen in the Holding Room. The risks, benefits, complications, treatment options, and expected outcomes were discussed with the patient.  The patient concurred with the proposed plan, giving informed consent.  The site of surgery properly noted/marked. The patient was taken to the Operating Room, identified as Julia Lynch and the procedure verified as Procedure(s) (LRB): DILATATION AND CURETTAGE /HYSTEROSCOPY (Bilateral) ENDOMETRIAL ABLATION (Bilateral).   The patient was then placed under general anesthesia without difficulty.  She was prepped and draped in the normal sterile fashion, and placed in the dorsal lithotomy position.  A time out was performed.  An exam under anesthesia was performed with the findings noted above.  Straight catheterization was performed. A sterile speculum was inserted into vagina. A single-tooth tenaculum was used to grasp the anterior lip of the cervix. Cervical dilation was performed. A 5 mm hysteroscope was introduced into the uterus under direct visualization. The cavity was allowed to fill, and then the entire cavity was explored with the findings described above. The hysteroscope was removed, and a sharp curette was then passed into the uterus and endometrial sampling was collected for pathology.   Next the Minerva instrument was primed per instructions. The instrument was then placed into the endometrial canal and activated.  The Minerva instrument was  then removed from the uterine cavity.  The hysteroscope was then re-introduced for final survey, with adequate charring of the endometrium noted.  The hysteroscope was removed from the patient's uterine cavity. The tenaculum was removed and excellent hemostasis was noted. The speculum was removed from the vagina.   All instrument and sponge counts were correct at the end of the procedure x 2.  The patient tolerated the procedure well.  She was awakened and taken to the PACU in stable condition.   Findings: The uterus was sounded to 9.5 cm.  The cervical length was 3 cm. Weakly proliferative endometrium.  Tubal ostia were visualized bilaterally.  No intrauterine masses.    Adequate charring of endometrial tissue post ablation.  No perforations noted.    Estimated Blood Loss:  minimal      Drains: straight catheterization prior to procedure with 200 ml of clear urine         Total IV Fluids:  400 ml  Specimens: Endometrial curettings         Implants: None         Complications:  None; patient tolerated the procedure well.         Disposition: PACU - hemodynamically stable.         Condition: stable   Hildred Laser, MD [facility]

## 2020-03-07 NOTE — Interval H&P Note (Signed)
History and Physical Interval Note:  03/07/2020 10:10 AM  [name] Julia Lynch  has presented today for surgery, with the diagnosis of Hysteroscopy D&C with endometrial ablation.  The various methods of treatment have been discussed with the patient and family. After consideration of risks, benefits and other options for treatment, the patient has consented to  Procedure(s): DILATATION AND CURETTAGE /HYSTEROSCOPY (Bilateral) ENDOMETRIAL ABLATION (Bilateral) as a surgical intervention.  The patient's history has been reviewed, patient examined, no change in status, stable for surgery.  I have reviewed the patient's chart and labs.  HgbA1c elevated. Questions were answered to the patient's satisfaction.     Hildred Laser, MD [facility]

## 2020-03-07 NOTE — Anesthesia Procedure Notes (Signed)
Procedure Name: Intubation Date/Time: 03/07/2020 11:13 AM Performed by: Letitia Neri, CRNA Pre-anesthesia Checklist: Patient identified, Patient being monitored, Timeout performed, Emergency Drugs available and Suction available Patient Re-evaluated:Patient Re-evaluated prior to induction Oxygen Delivery Method: Circle system utilized Preoxygenation: Pre-oxygenation with 100% oxygen Induction Type: IV induction Ventilation: Mask ventilation without difficulty Laryngoscope Size: Mac and 3 Grade View: Grade I Tube type: Oral Tube size: 7.0 mm Number of attempts: 1 Placement Confirmation: ETT inserted through vocal cords under direct vision,  positive ETCO2 and breath sounds checked- equal and bilateral Secured at: 21 cm Tube secured with: Tape Dental Injury: Teeth and Oropharynx as per pre-operative assessment

## 2020-03-11 LAB — SURGICAL PATHOLOGY

## 2020-03-13 ENCOUNTER — Encounter: Payer: Self-pay | Admitting: Obstetrics and Gynecology

## 2020-03-13 ENCOUNTER — Ambulatory Visit (INDEPENDENT_AMBULATORY_CARE_PROVIDER_SITE_OTHER): Payer: BC Managed Care – PPO | Admitting: Obstetrics and Gynecology

## 2020-03-13 ENCOUNTER — Other Ambulatory Visit: Payer: Self-pay

## 2020-03-13 VITALS — BP 135/82 | HR 89 | Ht 61.0 in | Wt 297.4 lb

## 2020-03-13 DIAGNOSIS — Z9889 Other specified postprocedural states: Secondary | ICD-10-CM

## 2020-03-13 NOTE — Progress Notes (Signed)
    OBSTETRICS/GYNECOLOGY POST-OPERATIVE CLINIC VISIT  Subjective:     Julia Lynch is a 44 y.o. female who presents to the clinic 1 week status post hysteroscopy D&C with endometrial ablation ([facility]) for abnormal uterine bleeding. Eating a regular diet without difficulty. Bowel movements are normal. Pain is controlled without any medications. Mostly crampy feeling. Denies any foul-smelling discharge or abnormal bleeding.  The following portions of the patient's history were reviewed and updated as appropriate: allergies, current medications, past family history, past medical history, past social history, past surgical history and problem list.  Review of Systems Pertinent items noted in HPI and remainder of comprehensive ROS otherwise negative.    Objective:    BP 135/82   Pulse 89   Ht 5\' 1"  (1.549 m)   Wt 297 lb 6.4 oz (134.9 kg)   BMI 56.19 kg/m  General:  alert and no distress  Abdomen: soft, bowel sounds active, non-tender  Pelvis:   deferred    Pathology:   Results for orders placed or performed during the hospital encounter of 03/07/20  Surgical pathology  Result Value Ref Range   SURGICAL PATHOLOGY      SURGICAL PATHOLOGY CASE: [id number] PATIENT: 08-14-2002 Surgical Pathology Report     Specimen Submitted: A. Endometrial curettings  Clinical History: Hysteroscopy DC with endometrial ablation.  R59.1 head and neck lymphadenopathy      DIAGNOSIS: A. UTERUS, ENDOMETRIAL CURETTINGS: - BENIGN SECRETORY ENDOMETRIUM. - NEGATIVE FOR HYPERPLASIA AND MALIGNANCY.   GROSS DESCRIPTION: A. Labeled: Endometrium curettage Received: In formalin Tissue fragment(s): Multiple Size: 2.5 x 1 x 0.2 cm Description: Aggregate of pink-red soft tissue Entirely submitted in 1 cassette.   Final Diagnosis performed by Cristopher Estimable, MD.   Electronically signed 03/11/2020 1:50:28PM The electronic signature indicates that the named Attending  Pathologist has evaluated the specimen Technical component performed at [facility], [address], [place], [place] Lab: [phone] Dir: [phone], MD, MMM  Professional component performed at [facility], [facility], [address] [place], [place], [place] Lab: [phone] Dir: [phone]. Georgiann Cocker, MD     Assessment:    Doing well postoperatively. S/p endometrial ablation.    Plan:   1. Continue any current medications as needed. 2. Wound care discussed. 3. Operative findings again reviewed. Pathology report discussed. 4. Activity restrictions: no excessive squatting, bending, heavy lifting.  Pelvic rest x 2 additional weeks 5. Anticipated return to work: has already returned to work. 6. Follow up: as needed.     Oneita Kras, MD [facility]

## 2020-03-13 NOTE — Progress Notes (Signed)
Pt present for post op follow up. Pt stated that she was doing well the first few days and she noticed on Tuesday having severe cramping; she took tylenol 650 mg and was fine still having cramping off and on but nothing like on Tuesday.

## 2020-04-01 ENCOUNTER — Encounter: Payer: Self-pay | Admitting: Physician Assistant

## 2020-04-08 ENCOUNTER — Other Ambulatory Visit: Payer: Self-pay | Admitting: Physician Assistant

## 2020-04-08 DIAGNOSIS — E1165 Type 2 diabetes mellitus with hyperglycemia: Secondary | ICD-10-CM

## 2020-04-08 NOTE — Telephone Encounter (Signed)
Requested Prescriptions  Pending Prescriptions Disp Refills  . metFORMIN (GLUCOPHAGE-XR) 500 MG 24 hr tablet [Pharmacy Med Name: METFORMIN HCL ER 500 MG TABLET] 60 tablet 0    Sig: TAKE 2 TABLETS BY MOUTH EVERY DAY WITH BREAKFAST     Endocrinology:  Diabetes - Biguanides Failed - 04/08/2020  2:56 AM      Failed - HBA1C is between 0 and 7.9 and within 180 days    Hgb A1c MFr Bld  Date Value Ref Range Status  03/05/2020 8.3 (H) 4.8 - 5.6 % Final    Comment:    (NOTE) Pre diabetes:          5.7%-6.4% Diabetes:              >6.4% Glycemic control for   <7.0% adults with diabetes          Passed - Cr in normal range and within 360 days    Creatinine, Ser  Date Value Ref Range Status  03/05/2020 0.78 0.44 - 1.00 mg/dL Final         Passed - eGFR in normal range and within 360 days    GFR calc Af Amer  Date Value Ref Range Status  03/05/2020 >60 >60 mL/min Final   GFR calc non Af Amer  Date Value Ref Range Status  03/05/2020 >60 >60 mL/min Final         Passed - Valid encounter within last 6 months    Recent Outpatient Visits          3 months ago Irritable bowel syndrome with diarrhea   [place], [place], [place]   8 months ago Benign essential HTN   [place], [place], [place]   1 year ago Type 2 diabetes mellitus with hyperglycemia, without long-term current use of insulin [facility])   [place], [place], [place]   1 year ago Benign essential HTN   [place], [place], [place]   1 year ago Tension headache   [facility] [place], Dionne Bucy, MD      Future Appointments            In 1 month [place], Lara Mulch, CNM [facility]

## 2020-05-11 IMAGING — CR CHEST - 2 VIEW
1 series · 2 of 2 positions shown · non-contrast
Comparison: None.

CLINICAL DATA: Preop for gastric surgery.  Morbid obesity.

EXAM:
CHEST - 2 VIEW

[Series 1: dg chest 2 view · 0.14mm/px · 2 of 2 slices shown]
[im 1/2]
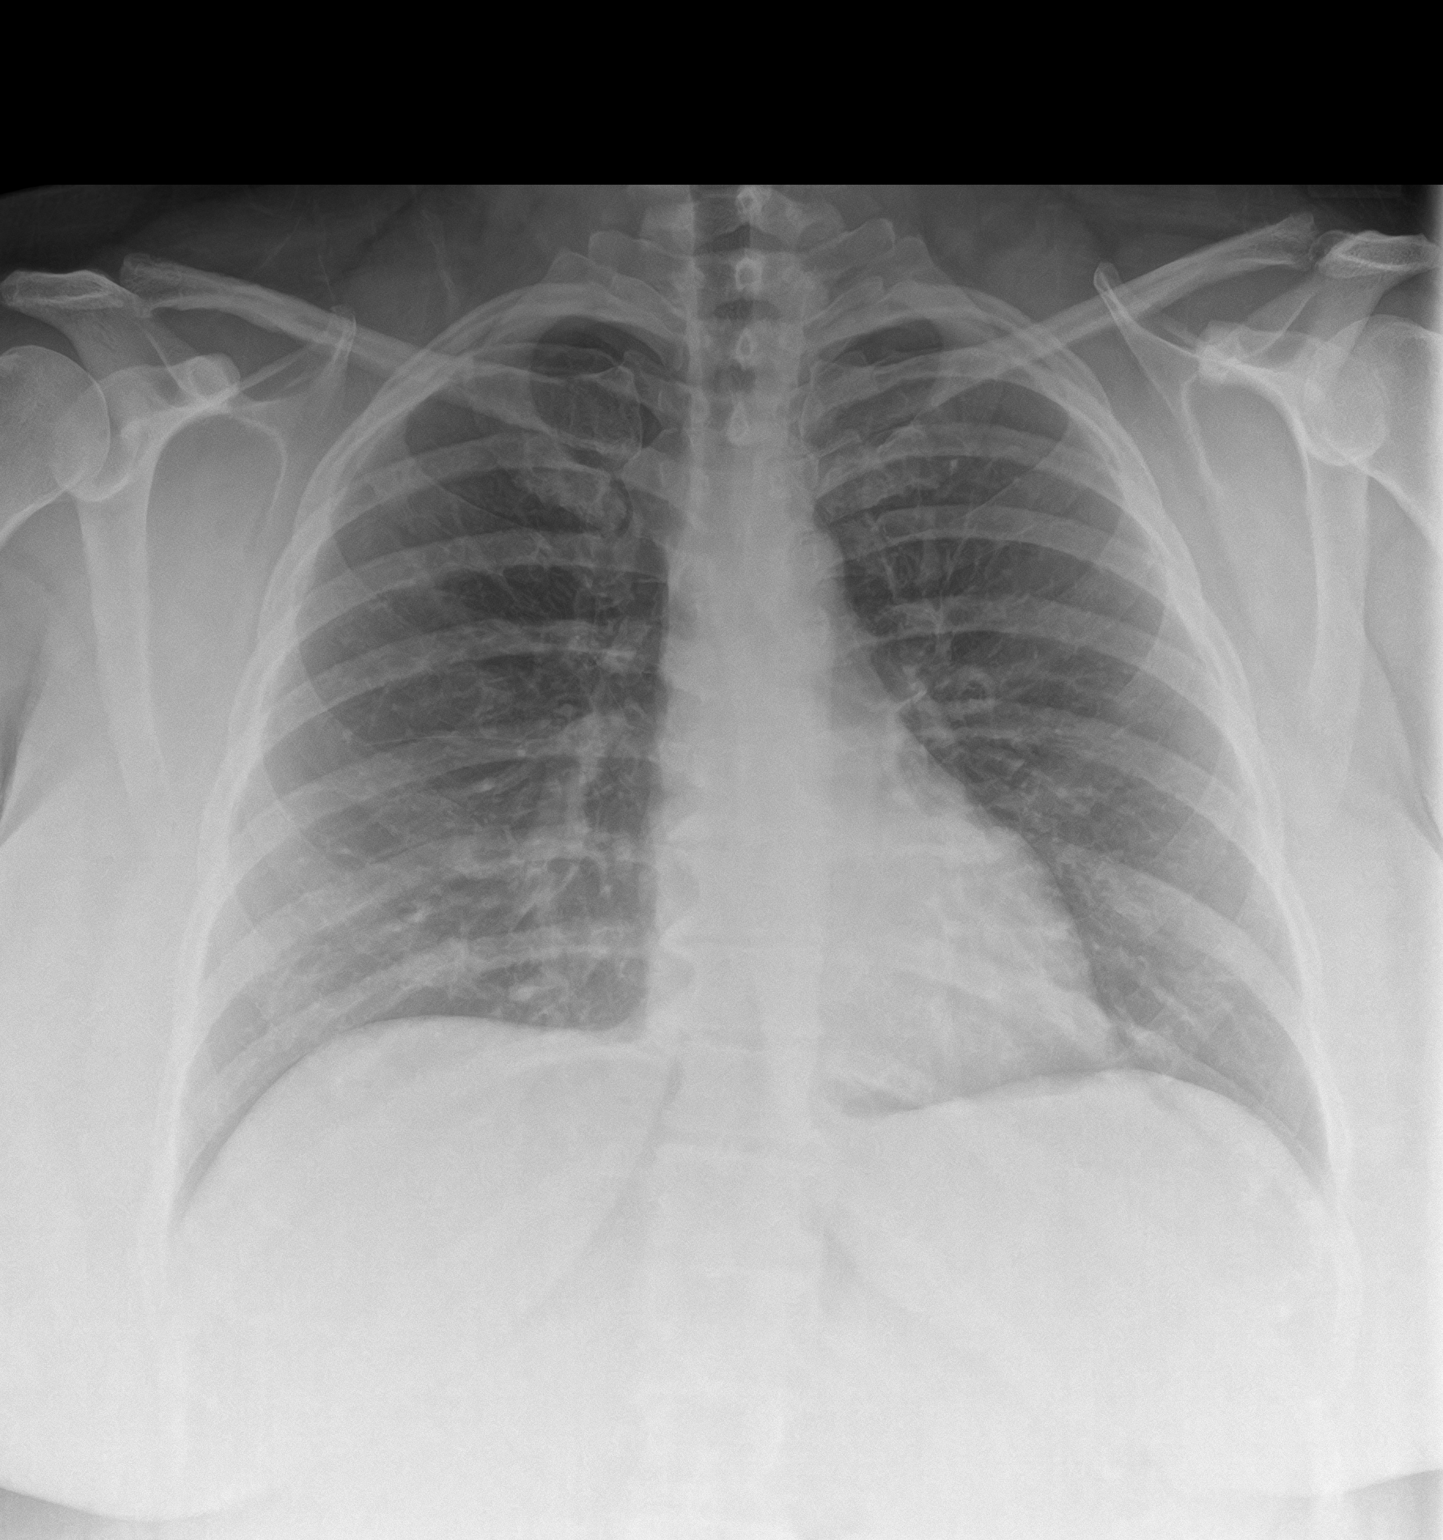
[im 2/2]
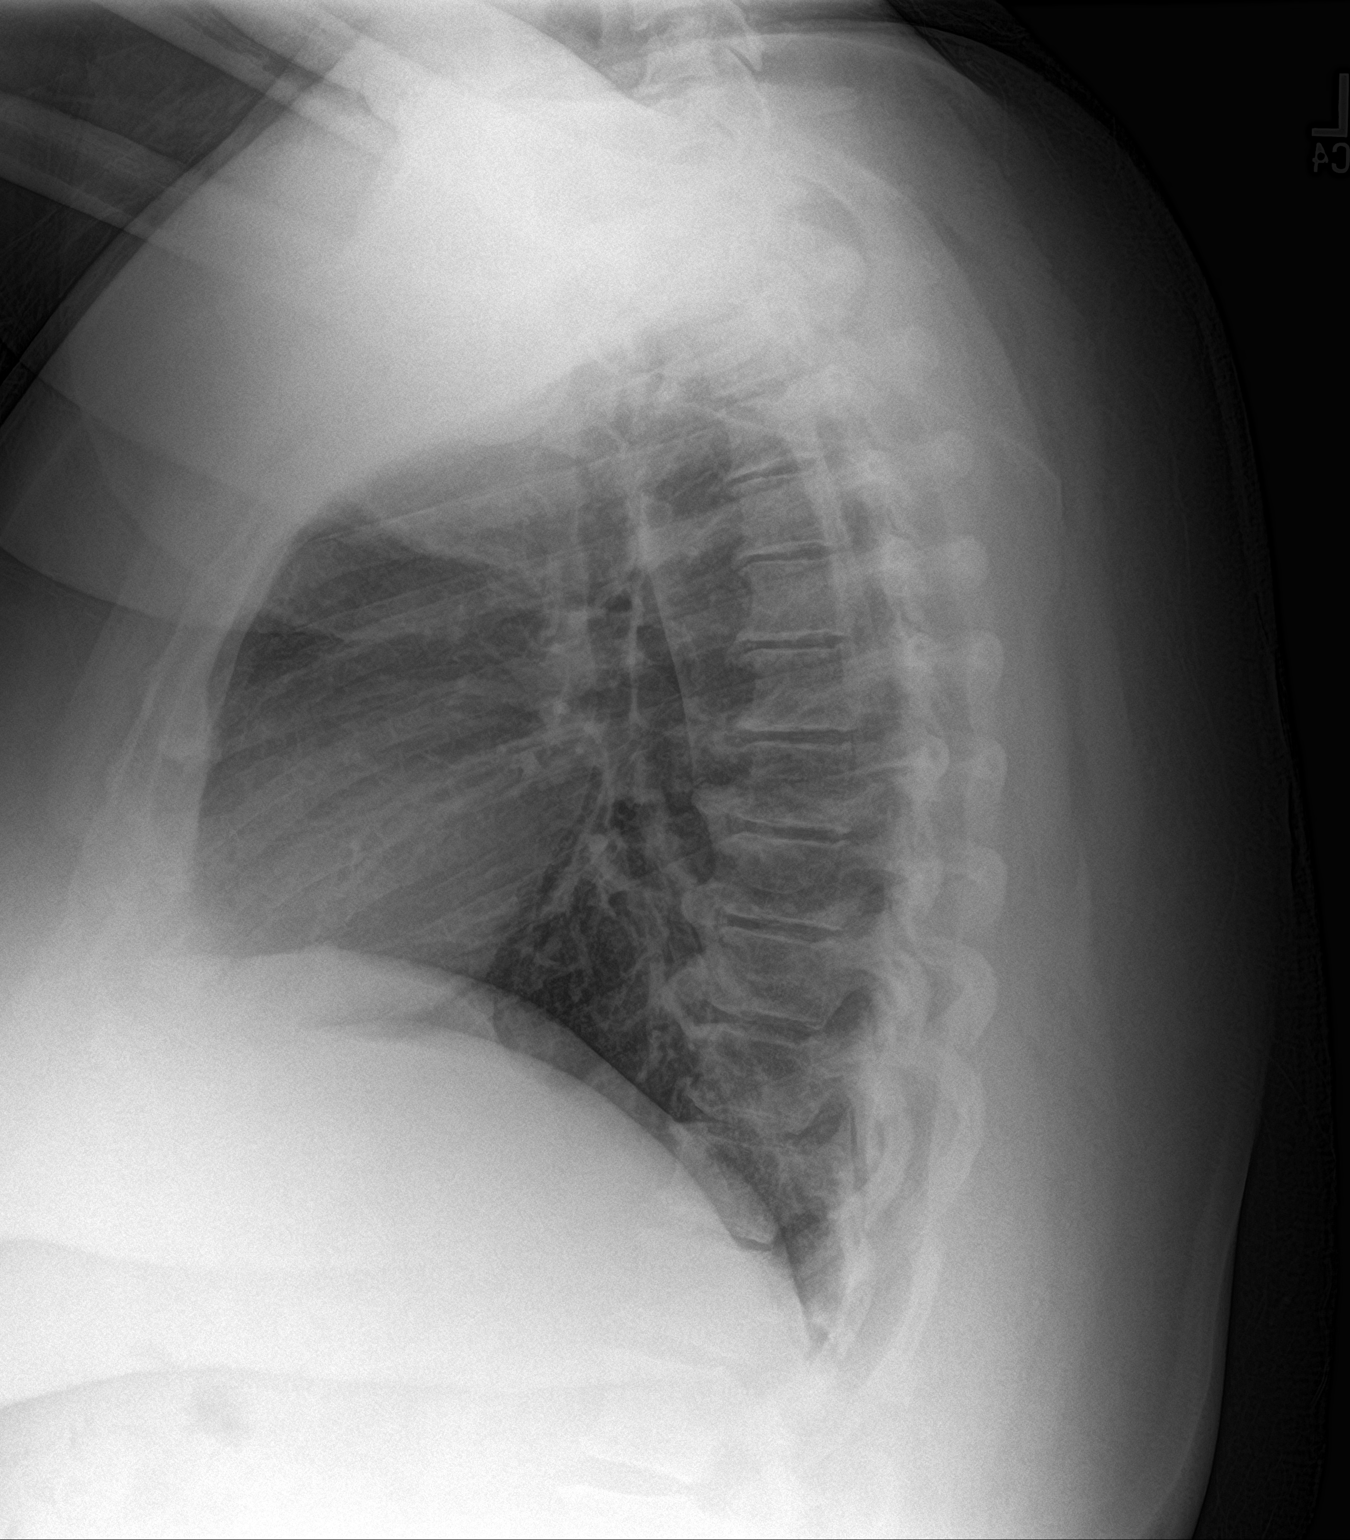

[2 of 2 positions shown; findings below may reference images not displayed]

FINDINGS: The heart size and mediastinal contours are within normal limits.
Both lungs are clear. The visualized skeletal structures are
unremarkable.
IMPRESSION: No active cardiopulmonary disease.

## 2020-05-12 ENCOUNTER — Ambulatory Visit (INDEPENDENT_AMBULATORY_CARE_PROVIDER_SITE_OTHER): Payer: BC Managed Care – PPO | Admitting: Certified Nurse Midwife

## 2020-05-12 ENCOUNTER — Encounter: Payer: Self-pay | Admitting: Certified Nurse Midwife

## 2020-05-12 VITALS — BP 128/76 | HR 96 | Ht 61.0 in | Wt 295.0 lb

## 2020-05-12 DIAGNOSIS — Z1231 Encounter for screening mammogram for malignant neoplasm of breast: Secondary | ICD-10-CM

## 2020-05-12 DIAGNOSIS — Z01419 Encounter for gynecological examination (general) (routine) without abnormal findings: Secondary | ICD-10-CM | POA: Diagnosis not present

## 2020-05-12 NOTE — Progress Notes (Signed)
ANNUAL PREVENTATIVE CARE GYN  ENCOUNTER NOTE  Subjective:       Julia Lynch is a 44 y.o. [phone] female here for a routine annual gynecologic exam.    Doing well after ablation, no questions or concerns.   Denies difficulty breathing or respiratory distress, chest pain, abdominal pain, excessive vaginal bleeding, dysuria, and leg pain or swelling.    Gynecologic History  No LMP recorded. Patient has had an ablation.  Contraception: vasectomy  Last Pap: 10/2018. Results were: Neg/Neg  Last mammogram: 08/2017. Results were: BI-RADS 1  Obstetric History  OB History  Gravida Para Term Preterm AB Living  4 3 3   1 3   SAB TAB Ectopic Multiple Live Births  1       3    # Outcome Date GA Lbr Len/2nd Weight Sex Delivery Anes PTL Lv  4 Term 05/20/12   7 lb (3.175 kg) M Vag-Spont  N LIV  3 Term 01/04/11   9 lb 3 oz (4.167 kg) M Vag-Spont  N LIV  2 Term 08/29/06   7 lb 2 oz (3.232 kg) M Vag-Spont  N LIV  1 SAB 1998            Past Medical History:  Diagnosis Date  . Anxiety   . Arthritis   . Asthma    h/o when she smoked  . Depression   . Diabetic acidosis, type II (HCC)   . GERD (gastroesophageal reflux disease)   . Headache   . History of chicken pox   . History of kidney stones    h/o  . Hypertension   . PCOS (polycystic ovarian syndrome)   . Sleep apnea    uses cpap  . Vitamin D deficiency disease     Past Surgical History:  Procedure Laterality Date  . ENDOMETRIAL ABLATION Bilateral 03/07/2020   Procedure: ENDOMETRIAL ABLATION;  Surgeon: 03/09/2020, MD;  Location: [facility] ORS;  Service: Gynecology;  Laterality: Bilateral;  . HYSTEROSCOPY WITH D & C Bilateral 03/07/2020   Procedure: DILATATION AND CURETTAGE /HYSTEROSCOPY;  Surgeon: 03/09/2020, MD;  Location: [facility] ORS;  Service: Gynecology;  Laterality: Bilateral;  . left ankle repair  1995    Current Outpatient Medications on File Prior to Visit  Medication Sig Dispense Refill  . acetaminophen  (TYLENOL 8 HOUR) 650 MG CR tablet Take 1 tablet (650 mg total) by mouth every 8 (eight) hours as needed for pain. 30 tablet 0  . citalopram (CELEXA) 40 MG tablet TAKE 1 TABLET BY MOUTH EVERY DAY (Patient taking differently: Take 20 mg by mouth every morning. ) 30 tablet 11  . cyclobenzaprine (FLEXERIL) 5 MG tablet Take 1 tablet (5 mg total) by mouth 3 (three) times daily as needed for muscle spasms. (Patient taking differently: Take 5 mg by mouth 3 (three) times daily as needed (Headache). ) 30 tablet 1  . diazepam (VALIUM) 5 MG tablet TAKE AS NEEDED FOR HEADACHES, NO MORE THAN 3 IN A DAY (Patient taking differently: Take 5 mg by mouth daily as needed (Headache). ) 30 tablet 5  . diphenoxylate-atropine (LOMOTIL) 2.5-0.025 MG tablet Take 1 tablet by mouth 4 (four) times daily. (Patient taking differently: Take 1 tablet by mouth daily as needed for diarrhea or loose stools. ) 120 tablet 1  . hydrochlorothiazide (HYDRODIURIL) 25 MG tablet TAKE 1 TABLET BY MOUTH EVERY DAY (Patient taking differently: Take 25 mg by mouth daily. ) 30 tablet 11  . magnesium oxide (MAG-OX) 400 MG tablet Take  400 mg by mouth 2 (two) times daily.    . metFORMIN (GLUCOPHAGE-XR) 500 MG 24 hr tablet TAKE 2 TABLETS BY MOUTH EVERY DAY WITH BREAKFAST 180 tablet 0  . omeprazole (PRILOSEC) 20 MG capsule TAKE 1 CAPSULE BY MOUTH EVERY DAY**NEEDS REFILLS* (Patient taking differently: Take 20 mg by mouth every morning. ) 90 capsule 2  . triamcinolone cream (KENALOG) 0.1 % Apply 1 application topically 2 (two) times daily. (Patient taking differently: Apply 1 application topically daily as needed (Dry skin). ) 30 g 0  . valACYclovir (VALTREX) 500 MG tablet Take 1 tablet (500 mg total) by mouth daily. Can increase to twice a day for 5 days in the event of a recurrence (Patient taking differently: Take 500 mg by mouth daily as needed (flair up). Can increase to twice a day for 5 days in the event of a recurrence) 30 tablet 12  . Vitamin D,  Ergocalciferol, (DRISDOL) 1.25 MG (50000 UT) CAPS capsule TAKE 1 CAPSULE (50,000 UNITS TOTAL) BY MOUTH 2 (TWO) TIMES A WEEK. 8 capsule 7  . Biotin 5000 MCG TABS Take 5,000 mg by mouth daily.    . [DISCONTINUED] citalopram (CELEXA) 40 MG tablet     . [DISCONTINUED] hydrochlorothiazide (HYDRODIURIL) 25 MG tablet TAKE 1 TABLET BY MOUTH EVERY DAY 90 tablet 3   No current facility-administered medications on file prior to visit.    Allergies  Allergen Reactions  . Ibuprofen     Other reaction(s): Other (See Comments) GI Upset  . Lodine [Etodolac] Hives and Other (See Comments)    GI upset    Social History   Socioeconomic History  . Marital status: Married    Spouse name: Not on file  . Number of children: 3  . Years of education: Not on file  . Highest education level: Not on file  Occupational History  . Not on file  Tobacco Use  . Smoking status: Former Smoker    Packs/day: 0.50    Years: 10.00    Pack years: 5.00    Types: Cigarettes    Quit date: 10/11/2004    Years since quitting: 15.5  . Smokeless tobacco: Never Used  Vaping Use  . Vaping Use: Never used  Substance and Sexual Activity  . Alcohol use: Not Currently    Alcohol/week: 1.0 standard drink    Types: 1 Standard drinks or equivalent per week    Comment: occ  . Drug use: No  . Sexual activity: Not Currently    Comment: husband-vasectomy  Other Topics Concern  . Not on file  Social History Narrative  . Not on file   Social Determinants of Health   Financial Resource Strain:   . Difficulty of Paying Living Expenses:   Food Insecurity:   . Worried About Programme researcher, broadcasting/film/video in the Last Year:   . Barista in the Last Year:   Transportation Needs:   . Freight forwarder (Medical):   Marland Kitchen Lack of Transportation (Non-Medical):   Physical Activity:   . Days of Exercise per Week:   . Minutes of Exercise per Session:   Stress:   . Feeling of Stress :   Social Connections:   . Frequency of  Communication with Friends and Family:   . Frequency of Social Gatherings with Friends and Family:   . Attends Religious Services:   . Active Member of Clubs or Organizations:   . Attends Banker Meetings:   Marland Kitchen Marital Status:  Intimate Partner Violence:   . Fear of Current or Ex-Partner:   . Emotionally Abused:   Marland Kitchen Physically Abused:   . Sexually Abused:     Family History  Problem Relation Age of Onset  . Diabetes Father   . Hypertension Father   . Hepatitis C Mother   . Heart attack Maternal Grandfather   . Hypertension Paternal Grandmother   . Diabetes Paternal Grandmother     The following portions of the patient's history were reviewed and updated as appropriate: allergies, current medications, past family history, past medical history, past social history, past surgical history and problem list.  Review of Systems  ROS negative except as noted above. Information obtained from patient.    Objective:   BP 128/76   Pulse 96   Ht 5\' 1"  (1.549 m)   Wt 295 lb (133.8 kg)   BMI 55.74 kg/m   CONSTITUTIONAL: Well-developed, well-nourished female in no acute distress.   PSYCHIATRIC: Normal mood and affect. Normal behavior. Normal judgment and thought content.  NEUROLGIC: Alert and oriented to person, place, and time. Normal muscle tone coordination. No cranial nerve deficit noted.  HENT:  Normocephalic, atraumatic, External right and left ear normal.   EYES: Conjunctivae and EOM are normal. Pupils are equal and round.   NECK: Normal range of motion, supple, no masses.  Normal thyroid.   SKIN: Skin is warm and dry. No rash noted. Not diaphoretic. No erythema. No pallor. Professional tattoos present.   CARDIOVASCULAR: Normal heart rate noted, regular rhythm, no murmur.  RESPIRATORY: Clear to auscultation bilaterally. Effort and breath  sounds normal, no problems with respiration noted.  BREASTS: Symmetric in size. No masses, skin changes, nipple  drainage, or lymphadenopathy.  ABDOMEN: Soft, normal bowel sounds, no distention noted.  No tenderness, rebound or guarding.   PELVIC:  External Genitalia: Normal  Vagina: Normal  Cervix: Normal  Uterus: Unable to assess due to habitus  Adnexa: Unable to assess due to habitus  MUSCULOSKELETAL: Normal range of motion. No tenderness.  No cyanosis, clubbing, or edema.  2+ distal pulses.  LYMPHATIC: No Axillary, Supraclavicular, or Inguinal Adenopathy.  Assessment:   Annual gynecologic examination 44 y.o.   Contraception: vasectomy   Obesity 3   Problem List Items Addressed This Visit    None    Visit Diagnoses    Encounter for screening mammogram for malignant neoplasm of breast    -  Primary   Relevant Orders   MM 3D SCREEN BREAST BILATERAL   Well woman exam          Plan:   Pap: Not needed  Labs: Managed by PCP  Routine preventative health maintenance measures emphasized: Exercise/Diet/Weight control, Tobacco Warnings, Alcohol/Substance use risks and Stress Management; see AVS  Reviewed red flag symptoms and when to call  Return to Clinic - 1 Year   55, CNM  [facility] 05/12/20 3:46 PM

## 2020-05-12 NOTE — Patient Instructions (Signed)

## 2020-05-13 ENCOUNTER — Ambulatory Visit (INDEPENDENT_AMBULATORY_CARE_PROVIDER_SITE_OTHER): Payer: BC Managed Care – PPO | Admitting: Psychology

## 2020-05-27 ENCOUNTER — Ambulatory Visit (INDEPENDENT_AMBULATORY_CARE_PROVIDER_SITE_OTHER): Payer: BC Managed Care – PPO | Admitting: Psychology

## 2020-06-18 ENCOUNTER — Other Ambulatory Visit: Payer: Self-pay

## 2020-06-18 ENCOUNTER — Encounter: Payer: Self-pay | Admitting: Physician Assistant

## 2020-06-18 ENCOUNTER — Ambulatory Visit (INDEPENDENT_AMBULATORY_CARE_PROVIDER_SITE_OTHER): Payer: BC Managed Care – PPO | Admitting: Physician Assistant

## 2020-06-18 VITALS — BP 137/83 | HR 98 | Temp 98.7°F | Resp 16 | Ht 61.75 in | Wt 293.6 lb

## 2020-06-18 DIAGNOSIS — G4733 Obstructive sleep apnea (adult) (pediatric): Secondary | ICD-10-CM

## 2020-06-18 DIAGNOSIS — E1165 Type 2 diabetes mellitus with hyperglycemia: Secondary | ICD-10-CM

## 2020-06-18 DIAGNOSIS — Z6841 Body Mass Index (BMI) 40.0 and over, adult: Secondary | ICD-10-CM

## 2020-06-18 DIAGNOSIS — R809 Proteinuria, unspecified: Secondary | ICD-10-CM

## 2020-06-18 DIAGNOSIS — Z23 Encounter for immunization: Secondary | ICD-10-CM

## 2020-06-18 DIAGNOSIS — Z0000 Encounter for general adult medical examination without abnormal findings: Secondary | ICD-10-CM | POA: Diagnosis not present

## 2020-06-18 DIAGNOSIS — Z1329 Encounter for screening for other suspected endocrine disorder: Secondary | ICD-10-CM

## 2020-06-18 DIAGNOSIS — E1129 Type 2 diabetes mellitus with other diabetic kidney complication: Secondary | ICD-10-CM

## 2020-06-18 DIAGNOSIS — E782 Mixed hyperlipidemia: Secondary | ICD-10-CM | POA: Diagnosis not present

## 2020-06-18 LAB — POCT UA - MICROALBUMIN: Microalbumin Ur, POC: 100 mg/L

## 2020-06-18 MED ORDER — LISINOPRIL-HYDROCHLOROTHIAZIDE 20-25 MG PO TABS: 1.0000 | ORAL_TABLET | Freq: Every day | ORAL | 3 refills | Status: DC

## 2020-06-18 NOTE — Progress Notes (Signed)
Complete physical exam   Patient: Julia Lynch   DOB: [birth year]   43 y.o. Female  MRN: [id number] Visit Date: 06/18/2020  Today's healthcare provider: Margaretann Loveless, PA-C   Chief Complaint  Patient presents with  . Annual Exam   Subjective    Julia Lynch is a 44 y.o. female who presents today for a complete physical exam.  She reports consuming a general diet. The patient does not participate in regular exercise at present. She generally feels fairly well. She reports sleeping fairly well. She does not have additional problems to discuss today.  HPI   Past Medical History:  Diagnosis Date  . Anxiety   . Arthritis   . Asthma    h/o when she smoked  . Depression   . Diabetic acidosis, type II (HCC)   . GERD (gastroesophageal reflux disease)   . Headache   . History of chicken pox   . History of kidney stones    h/o  . Hypertension   . PCOS (polycystic ovarian syndrome)   . Sleep apnea    uses cpap  . Vitamin D deficiency disease    Past Surgical History:  Procedure Laterality Date  . ENDOMETRIAL ABLATION Bilateral 03/07/2020   Procedure: ENDOMETRIAL ABLATION;  Surgeon: Hildred Laser, MD;  Location: [facility] OR[facility];  Service: Gynecology;  Laterality: Bilateral;  . HYSTEROSCOPY WITH D & C Bilateral 03/07/2020   Procedure: DILATATION AND CURETTAGE /HYSTEROSCOPY;  Surgeon: Hildred Laser, MD;  Location: [facility] OR[facility];  Service: Gynecology;  Laterality: Bilateral;  . left ankle repair  1995   Social History   Socioeconomic History  . Marital status: Married    Spouse name: Not on file  . Number of children: 3  . Years of education: Not on file  . Highest education level: Not on file  Occupational History  . Not on file  Tobacco Use  . Smoking status: Former Smoker    Packs/day: 0.50    Years: 10.00    Pack years: 5.00    Types: Cigarettes    Quit date: 10/11/2004    Years since quitting: 15.6  . Smokeless tobacco: Never Used  Vaping Use  . Vaping  Use: Never used  Substance and Sexual Activity  . Alcohol use: Not Currently    Alcohol/week: 1.0 standard drink    Types: 1 Standard drinks or equivalent per week    Comment: occ  . Drug use: No  . Sexual activity: Not Currently    Comment: husband-vasectomy  Other Topics Concern  . Not on file  Social History Narrative  . Not on file   Social Determinants of Health   Financial Resource Strain:   . Difficulty of Paying Living Expenses: Not on file  Food Insecurity:   . Worried About Programme researcher, broadcasting/film/video in the Last Year: Not on file  . Ran Out of Food in the Last Year: Not on file  Transportation Needs:   . Lack of Transportation (Medical): Not on file  . Lack of Transportation (Non-Medical): Not on file  Physical Activity:   . Days of Exercise per Week: Not on file  . Minutes of Exercise per Session: Not on file  Stress:   . Feeling of Stress : Not on file  Social Connections:   . Frequency of Communication with Friends and Family: Not on file  . Frequency of Social Gatherings with Friends and Family: Not on file  . Attends Religious Services: Not on file  .  Active Member of Clubs or Organizations: Not on file  . Attends Banker Meetings: Not on file  . Marital Status: Not on file  Intimate Partner Violence:   . Fear of Current or Ex-Partner: Not on file  . Emotionally Abused: Not on file  . Physically Abused: Not on file  . Sexually Abused: Not on file   Family Status  Relation Name Status  . Father  Alive  . Mother  Alive  . MGF  Deceased at age 62  . PGM  Alive  . PGF  Deceased       Lougerrits disease   Family History  Problem Relation Age of Onset  . Diabetes Father   . Hypertension Father   . Hepatitis C Mother   . Heart attack Maternal Grandfather   . Hypertension Paternal Grandmother   . Diabetes Paternal Grandmother    Allergies  Allergen Reactions  . Ibuprofen     Other reaction(s): Other (See Comments) GI Upset  . Lodine  [Etodolac] Hives and Other (See Comments)    GI upset    Patient Care Team: Margaretann Loveless, PA-C as PCP - General (Family Medicine) Purcell Nails, CNM as Midwife (Obstetrics and Gynecology)   Medications: Outpatient Medications Prior to Visit  Medication Sig  . acetaminophen (TYLENOL 8 HOUR) 650 MG CR tablet Take 1 tablet (650 mg total) by mouth every 8 (eight) hours as needed for pain.  . Biotin 5000 MCG TABS Take 5,000 mg by mouth daily.  . citalopram (CELEXA) 40 MG tablet TAKE 1 TABLET BY MOUTH EVERY DAY (Patient taking differently: Take 20 mg by mouth every morning. )  . cyclobenzaprine (FLEXERIL) 5 MG tablet Take 1 tablet (5 mg total) by mouth 3 (three) times daily as needed for muscle spasms. (Patient taking differently: Take 5 mg by mouth 3 (three) times daily as needed (Headache). )  . diazepam (VALIUM) 5 MG tablet TAKE AS NEEDED FOR HEADACHES, NO MORE THAN 3 IN A DAY (Patient taking differently: Take 5 mg by mouth daily as needed (Headache). )  . diphenoxylate-atropine (LOMOTIL) 2.5-0.025 MG tablet Take 1 tablet by mouth 4 (four) times daily. (Patient taking differently: Take 1 tablet by mouth daily as needed for diarrhea or loose stools. )  . magnesium oxide (MAG-OX) 400 MG tablet Take 400 mg by mouth 2 (two) times daily.  . metFORMIN (GLUCOPHAGE-XR) 500 MG 24 hr tablet TAKE 2 TABLETS BY MOUTH EVERY DAY WITH BREAKFAST  . omeprazole (PRILOSEC) 20 MG capsule TAKE 1 CAPSULE BY MOUTH EVERY DAY**NEEDS REFILLS* (Patient taking differently: Take 20 mg by mouth every morning. )  . triamcinolone cream (KENALOG) 0.1 % Apply 1 application topically 2 (two) times daily. (Patient taking differently: Apply 1 application topically daily as needed (Dry skin). )  . valACYclovir (VALTREX) 500 MG tablet Take 1 tablet (500 mg total) by mouth daily. Can increase to twice a day for 5 days in the event of a recurrence (Patient taking differently: Take 500 mg by mouth daily as needed (flair up). Can  increase to twice a day for 5 days in the event of a recurrence)  . Vitamin D, Ergocalciferol, (DRISDOL) 1.25 MG (50000 UT) CAPS capsule TAKE 1 CAPSULE (50,000 UNITS TOTAL) BY MOUTH 2 (TWO) TIMES A WEEK.  . [DISCONTINUED] hydrochlorothiazide (HYDRODIURIL) 25 MG tablet TAKE 1 TABLET BY MOUTH EVERY DAY (Patient taking differently: Take 25 mg by mouth daily. )   No facility-administered medications prior to visit.  Review of Systems  Constitutional: Negative.   HENT: Negative.   Eyes: Negative.   Respiratory: Positive for apnea.   Cardiovascular: Negative.   Gastrointestinal: Positive for diarrhea.  Endocrine: Negative.   Genitourinary: Negative.   Musculoskeletal: Positive for arthralgias, neck pain and neck stiffness.  Skin: Negative.   Allergic/Immunologic: Negative.   Neurological: Negative.   Hematological: Negative.   Psychiatric/Behavioral: Positive for agitation.    Last CBC Lab Results  Component Value Date   WBC 8.6 03/05/2020   HGB 14.5 03/05/2020   HCT 41.3 03/05/2020   MCV 79.7 (L) 03/05/2020   MCH 28.0 03/05/2020   RDW 13.2 03/05/2020   PLT 285 03/05/2020   Last metabolic panel Lab Results  Component Value Date   GLUCOSE 290 (H) 03/05/2020   NA 132 (L) 03/05/2020   K 3.8 03/05/2020   CL 98 03/05/2020   CO2 24 03/05/2020   BUN 12 03/05/2020   CREATININE 0.78 03/05/2020   GFRNONAA >60 03/05/2020   GFRAA >60 03/05/2020   CALCIUM 8.9 03/05/2020   PROT 6.9 10/13/2018   ALBUMIN 4.0 10/13/2018   LABGLOB 2.9 10/13/2018   AGRATIO 1.4 10/13/2018   BILITOT 0.3 10/13/2018   ALKPHOS 92 10/13/2018   AST 18 10/13/2018   ALT 24 10/13/2018   ANIONGAP 10 03/05/2020      Objective    BP 137/83 (BP Location: Left Arm, Patient Position: Sitting, Cuff Size: Large)   Pulse 98   Temp 98.7 F (37.1 C) (Oral)   Resp 16   Ht 5' 1.75" (1.568 m)   Wt 293 lb 9.6 oz (133.2 kg)   BMI 54.14 kg/m  BP Readings from Last 3 Encounters:  06/18/20 137/83  05/12/20 128/76   03/13/20 135/82   Wt Readings from Last 3 Encounters:  06/18/20 293 lb 9.6 oz (133.2 kg)  05/12/20 295 lb (133.8 kg)  03/13/20 297 lb 6.4 oz (134.9 kg)      Physical Exam Vitals reviewed.  Constitutional:      General: She is not in acute distress.    Appearance: Normal appearance. She is well-developed. She is obese. She is not ill-appearing or diaphoretic.  HENT:     Head: Normocephalic and atraumatic.     Right Ear: Tympanic membrane, ear canal and external ear normal.     Left Ear: Tympanic membrane, ear canal and external ear normal.  Eyes:     General: No scleral icterus.       Right eye: No discharge.        Left eye: No discharge.     Extraocular Movements: Extraocular movements intact.     Conjunctiva/sclera: Conjunctivae normal.     Pupils: Pupils are equal, round, and reactive to light.  Neck:     Thyroid: No thyromegaly.     Vascular: No carotid bruit or JVD.     Trachea: No tracheal deviation.  Cardiovascular:     Rate and Rhythm: Normal rate and regular rhythm.     Pulses: Normal pulses.     Heart sounds: Normal heart sounds. No murmur heard.  No friction rub. No gallop.   Pulmonary:     Effort: Pulmonary effort is normal. No respiratory distress.     Breath sounds: Normal breath sounds. No wheezing or rales.  Chest:     Chest wall: No tenderness.  Abdominal:     General: Abdomen is protuberant. Bowel sounds are normal. There is no distension.     Palpations: Abdomen is soft. There is  no mass.     Tenderness: There is no abdominal tenderness. There is no guarding or rebound.  Musculoskeletal:        General: No tenderness. Normal range of motion.     Cervical back: Normal range of motion and neck supple.     Right lower leg: No edema.     Left lower leg: No edema.  Lymphadenopathy:     Cervical: No cervical adenopathy.  Skin:    General: Skin is warm and dry.     Capillary Refill: Capillary refill takes less than 2 seconds.     Findings: No rash.    Neurological:     General: No focal deficit present.     Mental Status: She is alert and oriented to person, place, and time. Mental status is at baseline.  Psychiatric:        Mood and Affect: Mood normal.        Behavior: Behavior normal.        Thought Content: Thought content normal.        Judgment: Judgment normal.      Last depression screening scores PHQ 2/9 Scores 06/18/2020 07/18/2019 12/15/2018  PHQ - 2 Score 3 2 2   PHQ- 9 Score 6 10 10    Last fall risk screening Fall Risk  06/18/2020  Falls in the past year? 1  Comment -  Number falls in past yr: 1  Injury with Fall? 0  Risk for fall due to : Other (Comment)  Follow up Falls evaluation completed   Last Audit-C alcohol use screening Alcohol Use Disorder Test (AUDIT) 06/18/2020  1. How often do you have a drink containing alcohol? 1  2. How many drinks containing alcohol do you have on a typical day when you are drinking? 0  3. How often do you have six or more drinks on one occasion? 0  AUDIT-C Score 1  Alcohol Brief Interventions/Follow-up AUDIT Score <7 follow-up not indicated   A score of 3 or more in women, and 4 or more in men indicates increased risk for alcohol abuse, EXCEPT if all of the points are from question 1   Results for orders placed or performed in visit on 06/18/20  POCT UA - Microalbumin  Result Value Ref Range   Microalbumin Ur, POC 100 mg/L    Assessment & Plan    Routine Health Maintenance and Physical Exam  Exercise Activities and Dietary recommendations Goals   None     Immunization History  Administered Date(s) Administered  . Influenza,inj,Quad PF,6+ Mos 09/26/2015, 08/20/2016, 10/13/2018, 06/18/2020  . Tdap 04/20/2012, 07/08/2014    Health Maintenance  Topic Date Due  . PNEUMOCOCCAL POLYSACCHARIDE VACCINE AGE 36-64 HIGH RISK  Never done  . COVID-19 Vaccine (1) Never done  . OPHTHALMOLOGY EXAM  12/09/2019  . FOOT EXAM  12/15/2019  . HEMOGLOBIN A1C  09/05/2020  . PAP  SMEAR-Modifier  10/13/2021  . TETANUS/TDAP  07/08/2024  . INFLUENZA VACCINE  Completed  . Hepatitis C Screening  Completed  . HIV Screening  Completed    Discussed health benefits of physical activity, and encouraged her to engage in regular exercise appropriate for her age and condition.  1. Encounter for annual physical exam Normal physical exam today. Will check labs as below and f/u pending lab results. If labs are stable and WNL she will not need to have these rechecked for one year at her next annual physical exam. She is to call the office in the meantime if  she has any acute issue, questions or concerns. - CBC with Differential/Platelet - Comprehensive metabolic panel - Hemoglobin A1c - TSH  2. Type 2 diabetes mellitus with hyperglycemia, without long-term current use of insulin (HCC) Microalbumin was 100 today. Will stop HCTZ alone and change to lisinopril-HCTZ as noted below. Rechecking A1c as below. Continue current metformin XR 500mg . Will consider starting Ozempic or Trulicity (whichever is covered) to help with glucose control as well as weight loss. F/U in 3 months.  - Hemoglobin A1c - POCT UA - Microalbumin - lisinopril-hydrochlorothiazide (ZESTORETIC) 20-25 MG tablet; Take 1 tablet by mouth daily.  Dispense: 90 tablet; Refill: 3  3. Type 2 diabetes mellitus with microalbuminuria, without long-term current use of insulin (HCC) See above medical treatment plan. - lisinopril-hydrochlorothiazide (ZESTORETIC) 20-25 MG tablet; Take 1 tablet by mouth daily.  Dispense: 90 tablet; Refill: 3  4. Hypercholesterolemia with hypertriglyceridemia Diet controlled. Will check labs as below and f/u pending results. - Lipid panel  5. Screening for thyroid disorder Will check labs as below and f/u pending results. - TSH  6. Obstructive sleep apnea Stable. Uses nightly. Rx written for new nasal mask.   7. Class 3 severe obesity due to excess calories with serious comorbidity and body  mass index (BMI) of 50.0 to 59.9 in adult [facility](HCC) Patient has been undergoing evaluation and therapy over the last 2 years for bariatric surgery. Is meeting with MD on 06/27/20 and hoped to be able to schedule bariatric surgery in Oct or Nov this year.   8. Need for influenza vaccination Flu vaccine given today without complication. Patient sat upright for 15 minutes to check for adverse reaction before being released. - Flu Vaccine QUAD 36+ mos IM   Return in about 3 months (around 09/17/2020) for a1c.     I, Margaretann LovelessJennifer M Cerita Rabelo, PA-C, have reviewed all documentation for this visit. The documentation on 06/18/20 for the exam, diagnosis, procedures, and orders are all accurate and complete.   Reine JustJennifer M Samanvitha Germany, PA-C  [facility] [phone] (phone) [phone] (fax)  [facility]

## 2020-06-18 NOTE — Patient Instructions (Signed)
Hydrochlorothiazide, HCTZ; Lisinopril Tablets What is this medicine? HYDROCHLOROTHIAZIDE; LISINOPRIL (hye droe klor oh THYE a zide; lyse IN oh pril) is a combination of a diuretic and an ACE inhibitor. It treats high blood pressure. This medicine may be used for other purposes; ask your health care provider or pharmacist if you have questions. COMMON BRAND NAME(S): Prinzide, Zestoretic What should I tell my health care provider before I take this medicine? They need to know if you have any of these conditions:  bone marrow disease  decreased urine  diabetes  heart or blood vessel disease  if you are on a special diet like a low salt diet  immune system problems, like lupus  kidney disease  liver disease  previous swelling of the tongue, face, or lips with difficulty breathing, difficulty swallowing, hoarseness, or tightening of the throat  recent heart attack or stroke  an unusual or allergic reaction to lisinopril, hydrochlorothiazide, sulfa drugs, other medicines, insect venom, foods, dyes, or preservatives  pregnant or trying to get pregnant  breast-feeding How should I use this medicine? Take this drug by mouth. Take it as directed on the prescription label at the same time every day. You can take it with or without food. If it upsets your stomach, take it with food. Keep taking it unless your health care provider tells you to stop. Talk to your health care provider about the use of this drug in children. Special care may be needed. Overdosage: If you think you have taken too much of this medicine contact a poison control center or emergency room at once. NOTE: This medicine is only for you. Do not share this medicine with others. What if I miss a dose? If you miss a dose, take it as soon as you can. If it is almost time for your next dose, take only that dose. Do not take double or extra doses. What may interact with this medicine? Do not take this medication with any of  the following medications:  sacubitril; valsartan This medicine may also interact with the following:  barbiturates like phenobarbital  blood pressure medicines  corticosteroids like prednisone  diabetic medications  diuretics, especially triamterene, spironolactone or amiloride  lithium  NSAIDs, medicines for pain and inflammation, like ibuprofen or naproxen  potassium salts or potassium supplements  prescription pain medicines  skeletal muscle relaxants like tubocurarine  some cholesterol lowering medications like cholestyramine or colestipol This list may not describe all possible interactions. Give your health care provider a list of all the medicines, herbs, non-prescription drugs, or dietary supplements you use. Also tell them if you smoke, drink alcohol, or use illegal drugs. Some items may interact with your medicine. What should I watch for while using this medicine? Visit your doctor or health care professional for regular checks on your progress. Check your blood pressure as directed. Ask your doctor or health care professional what your blood pressure should be and when you should contact him or her. Call your doctor or health care professional if you notice an irregular or fast heartbeat. You must not get dehydrated. Ask your doctor or health care professional how much fluid you need to drink a day. Check with him or her if you get an attack of severe diarrhea, nausea and vomiting, or if you sweat a lot. The loss of too much body fluid can make it dangerous for you to take this medicine. Women should inform their doctor if they wish to become pregnant or think they might be   pregnant. There is a potential for serious side effects to an unborn child. Talk to your health care professional or pharmacist for more information. You may get drowsy or dizzy. Do not drive, use machinery, or do anything that needs mental alertness until you know how this drug affects you. Do not  stand or sit up quickly, especially if you are an older patient. This reduces the risk of dizzy or fainting spells. Alcohol can make you more drowsy and dizzy. Avoid alcoholic drinks. This medicine may increase blood sugar. Ask your healthcare provider if changes in diet or medicines are needed if you have diabetes. Avoid salt substitutes unless you are told otherwise by your doctor or health care professional. Talk to your health care professional about your risk of skin cancer. You may be more at risk for skin cancer if you take this medicine. This medicine can make you more sensitive to the sun. Keep out of the sun. If you cannot avoid being in the sun, wear protective clothing and use sunscreen. Do not use sun lamps or tanning beds/booths. Do not treat yourself for coughs, colds, or pain while you are taking this medicine without asking your doctor or health care professional for advice. Some ingredients may increase your blood pressure. What side effects may I notice from receiving this medicine? Side effects that you should report to your doctor or health care professional as soon as possible:  changes in vision  confusion, dizziness, light headedness or fainting spells  decreased amount of urine passed  difficulty breathing or swallowing, hoarseness, or tightening of the throat  eye pain  fast or irregular heart beat, palpitations, or chest pain  muscle cramps  nausea and vomiting  persistent dry cough  redness, blistering, peeling or loosening of the skin, including inside the mouth   signs and symptoms of high blood sugar such as being more thirsty or hungry or having to urinate more than normal. You may also feel very tired or have blurry vision.  stomach pain  swelling of your face, lips, tongue, hands, or feet  unusual rash, bleeding or bruising, or pinpoint red spots on the skin  worsened gout pain  yellowing of the eyes or skin Side effects that usually do not  require medical attention (report to your doctor or health care professional if they continue or are bothersome):  change in sex drive or performance  cough  headache This list may not describe all possible side effects. Call your doctor for medical advice about side effects. You may report side effects to FDA at 1-800-FDA-1088. Where should I keep my medicine? Keep out of the reach of children and pets. Store at room temperature between 20 and 25 degrees C (68 and 77 degrees F). Protect from light and moisture. Keep the container tightly closed. Throw away any unused drug after the expiration date. NOTE: This sheet is a summary. It may not cover all possible information. If you have questions about this medicine, talk to your doctor, pharmacist, or health care provider.  2020 Elsevier/Gold Standard (2019-06-04 15:19:46)  

## 2020-06-20 ENCOUNTER — Encounter: Payer: Self-pay | Admitting: Physician Assistant

## 2020-06-20 ENCOUNTER — Telehealth: Payer: Self-pay

## 2020-06-20 DIAGNOSIS — E1165 Type 2 diabetes mellitus with hyperglycemia: Secondary | ICD-10-CM

## 2020-06-20 DIAGNOSIS — E782 Mixed hyperlipidemia: Secondary | ICD-10-CM

## 2020-06-20 LAB — COMPREHENSIVE METABOLIC PANEL
ALT: 26 IU/L (ref 0–32)
AST: 25 IU/L (ref 0–40)
Albumin/Globulin Ratio: 1.4 (ref 1.2–2.2)
Albumin: 3.9 g/dL (ref 3.8–4.8)
Alkaline Phosphatase: 98 IU/L (ref 48–121)
BUN/Creatinine Ratio: 18 (ref 9–23)
BUN: 12 mg/dL (ref 6–24)
Bilirubin Total: 0.2 mg/dL (ref 0.0–1.2)
CO2: 26 mmol/L (ref 20–29)
Calcium: 8.7 mg/dL (ref 8.7–10.2)
Chloride: 97 mmol/L (ref 96–106)
Creatinine, Ser: 0.66 mg/dL (ref 0.57–1.00)
GFR calc Af Amer: 125 mL/min/{1.73_m2} (ref >59)
GFR calc non Af Amer: 109 mL/min/{1.73_m2} (ref >59)
Globulin, Total: 2.7 g/dL (ref 1.5–4.5)
Glucose: 183 mg/dL — ABNORMAL HIGH (ref 65–99)
Potassium: 4.2 mmol/L (ref 3.5–5.2)
Sodium: 136 mmol/L (ref 134–144)
Total Protein: 6.6 g/dL (ref 6.0–8.5)

## 2020-06-20 LAB — CBC WITH DIFFERENTIAL/PLATELET
Basophils Absolute: 0.1 10*3/uL (ref 0.0–0.2)
Basos: 1 %
EOS (ABSOLUTE): 0.1 10*3/uL (ref 0.0–0.4)
Eos: 2 %
Hematocrit: 43 % (ref 34.0–46.6)
Hemoglobin: 14.4 g/dL (ref 11.1–15.9)
Immature Grans (Abs): 0 10*3/uL (ref 0.0–0.1)
Immature Granulocytes: 1 %
Lymphocytes Absolute: 2.2 10*3/uL (ref 0.7–3.1)
Lymphs: 31 %
MCH: 27.6 pg (ref 26.6–33.0)
MCHC: 33.5 g/dL (ref 31.5–35.7)
MCV: 82 fL (ref 79–97)
Monocytes Absolute: 0.5 10*3/uL (ref 0.1–0.9)
Monocytes: 8 %
Neutrophils Absolute: 4.1 10*3/uL (ref 1.4–7.0)
Neutrophils: 57 %
Platelets: 265 10*3/uL (ref 150–450)
RBC: 5.22 x10E6/uL (ref 3.77–5.28)
RDW: 13.8 % (ref 11.7–15.4)
WBC: 7 10*3/uL (ref 3.4–10.8)

## 2020-06-20 LAB — HEMOGLOBIN A1C
Est. average glucose Bld gHb Est-mCnc: 209 mg/dL
Hgb A1c MFr Bld: 8.9 % — ABNORMAL HIGH (ref 4.8–5.6)

## 2020-06-20 LAB — LIPID PANEL
Chol/HDL Ratio: 5.4 ratio — ABNORMAL HIGH (ref 0.0–4.4)
Cholesterol, Total: 200 mg/dL — ABNORMAL HIGH (ref 100–199)
HDL: 37 mg/dL — ABNORMAL LOW (ref >39)
LDL Chol Calc (NIH): 108 mg/dL — ABNORMAL HIGH (ref 0–99)
Triglycerides: 319 mg/dL — ABNORMAL HIGH (ref 0–149)
VLDL Cholesterol Cal: 55 mg/dL — ABNORMAL HIGH (ref 5–40)

## 2020-06-20 LAB — TSH: TSH: 1.17 u[IU]/mL (ref 0.450–4.500)

## 2020-06-20 MED ORDER — BD PEN NEEDLE NANO 2ND GEN 32G X 4 MM MISC: 0 refills | Status: DC

## 2020-06-20 MED ORDER — ATORVASTATIN CALCIUM 20 MG PO TABS: 20.0000 mg | ORAL_TABLET | Freq: Every day | ORAL | 3 refills | Status: DC

## 2020-06-20 MED ORDER — OZEMPIC (0.25 OR 0.5 MG/DOSE) 2 MG/1.5ML ~~LOC~~ SOPN: PEN_INJECTOR | SUBCUTANEOUS | 2 refills | Status: DC

## 2020-06-20 NOTE — Telephone Encounter (Signed)
-----   Message from Margaretann Loveless, [place] sent at 06/20/2020  1:47 PM EDT ----- Blood count is normal. Kidney and liver function are normal. Sodium, potassium, and calcium are normal. A1c has increased to 8.9 from 8.3. If agreeable, as discussed, I can send in either ozempic or trulicity for you (whichever is covered by insurance) for better A1c control and continued help with weight loss. Cholesterol is a little increased comapred to last year, specifically your triglycerides (most closely related to diet) and having a decline in your HDL (good) cholesterol). With being diabetic, I would recommend a cholesterol lowering medication such as a statin to offer cardioprotection. I can send this in as well if agreeable. Thyroid is normal.

## 2020-06-20 NOTE — Telephone Encounter (Signed)
Seen by patient Julia Lynch on 06/20/2020 4:02 PM

## 2020-07-11 ENCOUNTER — Encounter: Payer: Self-pay | Admitting: Dietician

## 2020-07-11 ENCOUNTER — Encounter: Payer: BC Managed Care – PPO | Attending: General Surgery | Admitting: Dietician

## 2020-07-11 ENCOUNTER — Other Ambulatory Visit: Payer: Self-pay

## 2020-07-11 DIAGNOSIS — Z6841 Body Mass Index (BMI) 40.0 and over, adult: Secondary | ICD-10-CM | POA: Diagnosis not present

## 2020-07-11 NOTE — Patient Instructions (Signed)
Continue with current eating pattern. Begin pre-op diet 2 weeks before surgery.

## 2020-07-11 NOTE — Progress Notes (Signed)
Nutrition Assessment/ Udate for Bariatric Surgery   Planned Surgery: Sleeve Gastrectomy   Height: 5'1" Weight: 292.0lbs with shoes BMI: 55.17   Starting weight at [facility]: 294.7 (08/02/2018)  Medications and Supplements: reconciled list in medical record. Patient has begun taking multivitamin and has bariatric formula vitamins on order.   Progress:   Patient reports undergoing group therapy for binge eating for 6 months, which proved to be very helpful for her. She denies any binge eating, and has been working on diet and lifestyle changes.   She reports taking small bites and chewing thoroughly, limiting/ avoiding drinks during meals  She was drinking some diet sodas, but has now stopped. She does drink other sugar free beverages.   She has kept portions of starchy foods small.   Physical activity has been difficult to implement due to change in work hours as well as frequent knee pain.  Dietary Recall:  Daily pattern: 3 meals and 1-2 snacks. Dining out: ? meals per week. Breakfast: usually ham and cheese on whole wheat bread, mustard Lunch: 12-2pm-- leftovers ie chicken taco salad; occasionally hot pocket (limits because of high level of processing) Supper: 6-7pm meat + starch ie potato, rice, or mac and cheese + veg ie green beans, brocc, occ brussels sprouts Snack(s): cheese stick; banana or orange; yogurt in pm Beverages: water occ with sugar free flavoring, coffee, green tea with Stevia, gatorade zero  Physical activity: active lifestyle -- home improvements; has membership at gym difficult due to work hours (10a - 10p 4 days per week)   Intervention:  Patient has implemented pre-op diet goals, and has begun using baritastic app. Commended her for her efforts.  Reviewed guidelines for 2 week pre-op diet.   Discussed stages of the bariatric diet after surgery including the importance of adequate protein and fluid intake.   Discussed importance of adherence to bariatric  diet to avoid post-op complications and side effects.   Summary:  Patient has made diet and lifestyle changes in effort to lose weight and prepare for bariatric surgery.  She has solid support from spouse and family.   She agrees to begin pre-op diet 2 weeks prior to surgery.   She is motivated to follow the bariatric diet after surgery. From a nutrition standpoint, she is ready to proceed with the bariatric surgery program.    Plan:  Patient will plan to return for post-op RD visits beginning 2 weeks after surgery.

## 2020-07-12 ENCOUNTER — Other Ambulatory Visit: Payer: Self-pay | Admitting: Physician Assistant

## 2020-07-12 DIAGNOSIS — E1165 Type 2 diabetes mellitus with hyperglycemia: Secondary | ICD-10-CM

## 2020-07-25 ENCOUNTER — Other Ambulatory Visit: Payer: Self-pay

## 2020-07-25 ENCOUNTER — Ambulatory Visit (INDEPENDENT_AMBULATORY_CARE_PROVIDER_SITE_OTHER): Payer: BC Managed Care – PPO | Admitting: Physician Assistant

## 2020-07-25 ENCOUNTER — Encounter: Payer: Self-pay | Admitting: Physician Assistant

## 2020-07-25 VITALS — BP 122/89 | HR 91 | Temp 98.7°F | Resp 16 | Wt 289.9 lb

## 2020-07-25 DIAGNOSIS — R591 Generalized enlarged lymph nodes: Secondary | ICD-10-CM | POA: Diagnosis not present

## 2020-07-25 MED ORDER — CEPHALEXIN 500 MG PO CAPS: 500.0000 mg | ORAL_CAPSULE | Freq: Two times a day (BID) | ORAL | 0 refills | Status: DC

## 2020-07-25 NOTE — Patient Instructions (Signed)

## 2020-07-25 NOTE — Progress Notes (Signed)
Established patient visit   Patient: Julia Lynch   DOB: 10-Jul-1976   44 y.o. Female  MRN: [id number] Visit Date: 07/25/2020  Today's healthcare provider: Margaretann Loveless, PA-C   Chief Complaint  Patient presents with  . Neck Pain   Subjective    HPI  Patient here with c/o left neck pain since last week. Pain radiates up to her jaw and ear. Reports that it hurts to turn her neck. She went to the chiropractor on Wednesday for the right side pain from a fall she had. She went to the Dentist yesterday and was everything looked good. Pain is worse at night and she has been taking Tylenol.  Patient Active Problem List   Diagnosis Date Noted  . Menorrhagia with regular cycle 02/07/2020  . Class 3 severe obesity due to excess calories with serious comorbidity and body mass index (BMI) of 50.0 to 59.9 in adult (HCC) 11/13/2018  . Hypercholesterolemia with hypertriglyceridemia 11/13/2018  . Shortness of breath 11/01/2017  . Obstructive sleep apnea 09/30/2017  . Diabetes mellitus (HCC) 07/28/2017  . History of postpartum depression 05/20/2017  . Benign essential HTN 10/17/2015  . Anxiety 09/26/2015  . Arthritis 09/26/2015  . Asthma 09/26/2015  . Heartburn 09/26/2015  . Back ache 09/26/2015  . Migraine 09/26/2015  . Morbidly obese (HCC) 09/26/2015  . PMS (premenstrual syndrome) 09/26/2015  . Situational depression 09/26/2015  . Generalized headaches 09/26/2015  . Esophageal reflux 08/20/2009  . Vitamin D deficiency 01/14/2009  . Fatigue 01/13/2009  . History of gestational diabetes 01/13/2009  . Hypersomnia 01/13/2009  . Polycystic ovaries 10/11/1998       Medications: Outpatient Medications Prior to Visit  Medication Sig  . acetaminophen (TYLENOL 8 HOUR) 650 MG CR tablet Take 1 tablet (650 mg total) by mouth every 8 (eight) hours as needed for pain.  Marland Kitchen atorvastatin (LIPITOR) 20 MG tablet Take 1 tablet (20 mg total) by mouth daily.  . Biotin 5000 MCG TABS Take  5,000 mg by mouth daily.  . citalopram (CELEXA) 40 MG tablet TAKE 1 TABLET BY MOUTH EVERY DAY (Patient taking differently: Take 20 mg by mouth every morning. )  . cyclobenzaprine (FLEXERIL) 5 MG tablet Take 1 tablet (5 mg total) by mouth 3 (three) times daily as needed for muscle spasms. (Patient taking differently: Take 5 mg by mouth 3 (three) times daily as needed (Headache). )  . diazepam (VALIUM) 5 MG tablet TAKE AS NEEDED FOR HEADACHES, NO MORE THAN 3 IN A DAY (Patient taking differently: Take 5 mg by mouth daily as needed (Headache). )  . diphenoxylate-atropine (LOMOTIL) 2.5-0.025 MG tablet Take 1 tablet by mouth 4 (four) times daily. (Patient taking differently: Take 1 tablet by mouth daily as needed for diarrhea or loose stools. )  . Insulin Pen Needle (BD PEN NEEDLE NANO 2ND GEN) 32G X 4 MM MISC For Ozempic injections  . lisinopril-hydrochlorothiazide (ZESTORETIC) 20-25 MG tablet Take 1 tablet by mouth daily.  . magnesium oxide (MAG-OX) 400 MG tablet Take 400 mg by mouth 2 (two) times daily.  . Multiple Vitamin (MULTIVITAMIN ADULT PO) Take by mouth.  Marland Kitchen omeprazole (PRILOSEC) 20 MG capsule TAKE 1 CAPSULE BY MOUTH EVERY DAY**NEEDS REFILLS* (Patient taking differently: Take 20 mg by mouth every morning. )  . Semaglutide,0.25 or 0.5MG /DOS, (OZEMPIC, 0.25 OR 0.5 MG/DOSE,) 2 MG/1.5ML SOPN Start with 0.25mg  SQ once weekly x 4 weeks, then increase to 0.5mg  SQ weekly  . valACYclovir (VALTREX) 500 MG tablet Take 1  tablet (500 mg total) by mouth daily. Can increase to twice a day for 5 days in the event of a recurrence (Patient taking differently: Take 500 mg by mouth daily as needed (flair up). Can increase to twice a day for 5 days in the event of a recurrence)  . triamcinolone cream (KENALOG) 0.1 % Apply 1 application topically 2 (two) times daily. (Patient taking differently: Apply 1 application topically daily as needed (Dry skin). )  . Vitamin D, Ergocalciferol, (DRISDOL) 1.25 MG (50000 UT) CAPS  capsule TAKE 1 CAPSULE (50,000 UNITS TOTAL) BY MOUTH 2 (TWO) TIMES A WEEK.   No facility-administered medications prior to visit.    Review of Systems  Constitutional: Negative for fatigue and fever.  HENT: Positive for ear pain. Negative for congestion, ear discharge, hearing loss, postnasal drip, rhinorrhea, sinus pressure, sinus pain, sneezing, sore throat and trouble swallowing.   Respiratory: Negative.   Cardiovascular: Negative.   Hematological: Positive for adenopathy.    Last CBC Lab Results  Component Value Date   WBC 7.0 06/19/2020   HGB 14.4 06/19/2020   HCT 43.0 06/19/2020   MCV 82 06/19/2020   MCH 27.6 06/19/2020   RDW 13.8 06/19/2020   PLT 265 06/19/2020   Last metabolic panel Lab Results  Component Value Date   GLUCOSE 183 (H) 06/19/2020   NA 136 06/19/2020   K 4.2 06/19/2020   CL 97 06/19/2020   CO2 26 06/19/2020   BUN 12 06/19/2020   CREATININE 0.66 06/19/2020   GFRNONAA 109 06/19/2020   GFRAA 125 06/19/2020   CALCIUM 8.7 06/19/2020   PROT 6.6 06/19/2020   ALBUMIN 3.9 06/19/2020   LABGLOB 2.7 06/19/2020   AGRATIO 1.4 06/19/2020   BILITOT 0.2 06/19/2020   ALKPHOS 98 06/19/2020   AST 25 06/19/2020   ALT 26 06/19/2020   ANIONGAP 10 03/05/2020      Objective    BP 122/89 (BP Location: Left Arm, Patient Position: Sitting, Cuff Size: Large)   Pulse 91   Temp 98.7 F (37.1 C) (Oral)   Resp 16   Wt 289 lb 14.4 oz (131.5 kg)   BMI 54.78 kg/m  BP Readings from Last 3 Encounters:  07/25/20 122/89  06/18/20 137/83  05/12/20 128/76   Wt Readings from Last 3 Encounters:  07/25/20 289 lb 14.4 oz (131.5 kg)  07/11/20 292 lb (132.5 kg)  06/18/20 293 lb 9.6 oz (133.2 kg)      Physical Exam Vitals reviewed.  Constitutional:      General: She is not in acute distress.    Appearance: Normal appearance. She is well-developed. She is obese. She is not ill-appearing or diaphoretic.  HENT:     Head: Normocephalic and atraumatic.     Right Ear:  Hearing, tympanic membrane, ear canal and external ear normal.     Left Ear: Hearing, tympanic membrane, ear canal and external ear normal.     Nose: Nose normal.     Mouth/Throat:     Mouth: Mucous membranes are moist.     Pharynx: Oropharynx is clear. Uvula midline. No oropharyngeal exudate or posterior oropharyngeal erythema.  Eyes:     General: No scleral icterus.       Right eye: No discharge.        Left eye: No discharge.     Extraocular Movements: Extraocular movements intact.     Conjunctiva/sclera: Conjunctivae normal.     Pupils: Pupils are equal, round, and reactive to light.  Neck:  Thyroid: No thyromegaly.     Trachea: No tracheal deviation.   Cardiovascular:     Rate and Rhythm: Normal rate and regular rhythm.     Heart sounds: Normal heart sounds. No murmur heard.  No friction rub. No gallop.   Pulmonary:     Effort: Pulmonary effort is normal. No respiratory distress.     Breath sounds: Normal breath sounds. No stridor. No wheezing or rales.  Musculoskeletal:     Cervical back: Normal range of motion and neck supple. Tenderness present.  Lymphadenopathy:     Cervical: Cervical adenopathy present.     Left cervical: Superficial cervical adenopathy present.  Skin:    General: Skin is warm and dry.  Neurological:     Mental Status: She is alert.       No results found for any visits on 07/25/20.  Assessment & Plan     1. Lymphadenopathy Will treat with keflex as below. Warm compresses. Can use tylenol prn for pain. Call if not improving and will get [place] for further evaluation.  - cephALEXin (KEFLEX) 500 MG capsule; Take 1 capsule (500 mg total) by mouth 2 (two) times daily.  Dispense: 14 capsule; Refill: 0  No follow-ups on file.      Delmer Islam, PA-C, have reviewed all documentation for this visit. The documentation on 07/29/20 for the exam, diagnosis, procedures, and orders are all accurate and complete.   Reine Just    [facility] [phone] (phone) [phone] (fax)  [facility]

## 2020-07-29 ENCOUNTER — Ambulatory Visit: Payer: BC Managed Care – PPO | Admitting: Skilled Nursing Facility1

## 2020-08-05 ENCOUNTER — Encounter: Payer: Self-pay | Admitting: Physician Assistant

## 2020-08-11 ENCOUNTER — Encounter: Payer: BC Managed Care – PPO | Attending: General Surgery | Admitting: Skilled Nursing Facility1

## 2020-08-11 ENCOUNTER — Other Ambulatory Visit: Payer: Self-pay

## 2020-08-11 DIAGNOSIS — E669 Obesity, unspecified: Secondary | ICD-10-CM | POA: Insufficient documentation

## 2020-08-11 NOTE — Progress Notes (Signed)
Pre-Operative Nutrition Class:  Appt start time: 4739   End time:  1830.  Patient was seen on 08/11/2020 for Pre-Operative Bariatric Surgery Education at [facility].    Surgery date: 09/01/2020 Surgery type: sleeve Start weight at [facility]: 294.7 Weight today: 282.1  The following the learning objectives were met by the patient during this course:  Identify Pre-Op Dietary Goals and will begin 2 weeks pre-operatively  Identify appropriate sources of fluids and proteins   State protein recommendations and appropriate sources pre and post-operatively  Identify Post-Operative Dietary Goals and will follow for 2 weeks post-operatively  Identify appropriate multivitamin and calcium sources  Describe the need for physical activity post-operatively and will follow MD recommendations  State when to call healthcare provider regarding medication questions or post-operative complications  Handouts given during class include:  Pre-Op Bariatric Surgery Diet Handout  Protein Shake Handout  Post-Op Bariatric Surgery Nutrition Handout  BELT Program Information Flyer  Support Group Information Flyer  [facility] Bariatric Supplements Price List  Follow-Up Plan: Patient will follow-up at [facility] 2 weeks post operatively for diet advancement per MD.

## 2020-08-21 ENCOUNTER — Ambulatory Visit: Payer: Self-pay | Admitting: General Surgery

## 2020-08-21 NOTE — H&P (Signed)
Julia Lynch Appointment: 08/21/2020 11:00 AM Location: [facility] Patient #: [id number] DOB: 1976/09/15 Married / Language: Julia Lynch / Race: White Female  History of Present Illness Randall Hiss M. Lyda Colcord MD; 08/21/2020 11:31 AM) The patient is a 44 year old female who presents with obesity. She comes in for clinic visit. She denies any medical changes since I last saw her. She has been doing well on ozempic for diabetes as well as weight control. She denies any binge eating behavior since one year ago. She denies any chest pain, chest pressure, source of breath, orthopnea, paroxysmal nocturnal dyspnea. She denies any trips the emergency room hospital. She is still taking omeprazole. She denies any pain with eating or drinking.  06/27/20 Patient comes in for long-term follow-up regarding her severe obesity. She initially met one of my partners in September 2019 he has since retired so her care was transferred to me. She is interested in sleeve gastrectomy. She has completed the bariatric surgery evaluation process. She denies any medical changes since we last saw her. She denies any trips the emergency room or hospital.  Comorbidities include GERD, right knee osteoarthritis, left ankle osteoarthritis, diabetes mellitus type 2, obstructive sleep apnea on CPAP, hypertension  She denies any chest pain, chest pressure, shows of breath, orthopnea, paroxysmal nocturnal dyspnea, dyspnea on exertion, personal family history of blood clots. She does use CPAP. She does take omeprazole for GERD. It is well controlled. It mainly occurs with foods that are spicy or have seeds. She has a daily bowel movement. No melena or hematochezia. She has pain in her left ankle, right knee, right shoulder and right hip. She has occasional headaches twice a month. No TIAs or amaurosis fugax.  Negative tobacco or drugs. She drinks alcohol socially. She works for [name].  She had labs  recently which I reviewed. CBC and comprehensive metabolic panel were unremarkable except for a blood sugar of 183. A1c was 8.9. She was placed on ozempic. Total cholesterol 200, triglyceride 319, LDL 108, HDL 37. She has received psychological clearance. EKG, chest x-ray and upper GI were unremarkable last year.   06/2018 Patient is referred by Dr Lillia Abed PA for consideration for surgical treatment for morbid obesity. The patient gives a history of progressive obesity since adolescence despite multiple attempts at medical management. she has been through numerous efforts at physician supervised weight loss, medications and diet programs with some temporary success but then typically progressive weight regain. This has gone on for many years. She has come to the realization she is not going to be able to do it alone. Obesity has been affecting the patient in a number of ways including pain from arthritis in her ankle and difficulty with routine activities and enjoying activities with her children.. Significant co-morbid illnesses have developed including adult onset diabetes currently diet-controlled, obstructive sleep apnea, hypertension and moderate GERD. The patient has been to our initial information seminar, researched surgical options thoroughly, and is interested in sleeve gastrectomy due to the fewer intestinal side effects and lack of malabsorption.    Problem List/Past Medical Randall Hiss M. Redmond Pulling, MD; 08/21/2020 11:34 AM) HYPERTRIGLYCERIDEMIA (E78.1) PRE-OP TESTING (Z01.818) SEVERE OBESITY (E66.01) I think the patient is an acceptable candidate for laparoscopic sleeve gastrectomy. We reviewed her workup 2 mo ago. She had a normal upper GI last summer, chest x-ray and EKG. She has completed her psychological evaluation. She has worked with mental health providers regarding her binge eating history and has been cleared from a psychological  standpoint with respect to her binge  eating behavior which is now resolved. She had recent labs which reviewed as well. She had a medical exam just about 2 mo ago from her PCP.  This patient encounter took 37 minutes today to perform the following: take history, perform exam, review outside records, interpret imaging, counsel the patient on their diagnosis and document encounter, findings & plan in the EHR  Past Surgical History Randall Hiss M. Redmond Pulling, MD; 08/21/2020 11:34 AM) Foot Surgery Left.  Diagnostic Studies History Randall Hiss M. Redmond Pulling, MD; 08/21/2020 11:34 AM) Colonoscopy 1-5 years ago Pap Smear 1-5 years ago  Allergies Lindwood Coke, RN; 08/21/2020 11:05 AM) Ibuprofen *ANALGESICS - ANTI-INFLAMMATORY* Lodine *ANALGESICS - ANTI-INFLAMMATORY* Allergies Reconciled  Medication History (Diane Herrin, RN; 08/21/2020 11:09 AM) Multivitamin (Oral) Active. Ozempic (0.25 or 0.5 MG/DOSE) (2MG/1.5ML Soln Pen-inj, .5 Subcutaneous once a week) Active. Citalopram Hydrobromide (40MG Tablet, Oral) Active. Cyclobenzaprine HCl (5MG Tablet, Oral) Active. diazePAM (5MG Tablet, Oral) Active. Omeprazole (20MG Capsule DR, Oral) Active. valACYclovir HCl (500MG Tablet, Oral) Active. Vitamin D (Ergocalciferol) (1.25 MG(50000 UT) Capsule, Oral) Active. Lisinopril-hydroCHLOROthiazide (20-25MG Tablet, Oral) Active. Biotin (5000MCG Tablet, Oral) Active. Medications Reconciled  Social History Randall Hiss M. Redmond Pulling, MD; 08/21/2020 11:34 AM) Alcohol use Occasional alcohol use. Caffeine use Tea. No drug use Tobacco use Former smoker.  Family History Randall Hiss M. Redmond Pulling, MD; 08/21/2020 11:34 AM) Depression Family Members In General. Diabetes Mellitus Father. Hypertension Family Members In General.  Pregnancy / Birth History [name]. Redmond Pulling, MD; 08/21/2020 11:34 AM) Age at menarche 44 years. Gravida 4 Para 3 Regular periods  Other Problems Randall Hiss M. Redmond Pulling, MD; 08/21/2020 11:34 AM) Anxiety  Disorder Arthritis Asthma Depression Hemorrhoids Home Oxygen Use GASTROESOPHAGEAL REFLUX DISEASE, UNSPECIFIED WHETHER ESOPHAGITIS PRESENT (K21.9) ESSENTIAL HYPERTENSION (I10) OBSTRUCTIVE SLEEP APNEA ON CPAP (G47.33) DIABETES MELLITUS TYPE 2 IN OBESE (E11.69)     Review of Systems Randall Hiss M. Vickie Ponds MD; 08/21/2020 11:31 AM) All other systems negative  Vitals (Diane Herrin RN; 08/21/2020 11:09 AM) 08/21/2020 11:09 AM Weight: 280.38 lb Height: 61in Body Surface Area: 2.18 m Body Mass Index: 52.98 kg/m  Temp.: 97.55F  Pulse: 122 (Regular)  P.OX: 98% (Room air) BP: 124/84(Sitting, Left Arm, Standard)        Physical Exam Randall Hiss M. Zae Kirtz MD; 08/21/2020 11:31 AM)  The physical exam findings are as follows: Note:central truncal obesity  General Mental Status-Alert. General Appearance-Consistent with stated age. Hydration-Well hydrated. Voice-Normal.  Head and Neck Head-normocephalic, atraumatic with no lesions or palpable masses. Trachea-midline. Thyroid Gland Characteristics - normal size and consistency.  Eye Eyeball - Bilateral-Extraocular movements intact. Sclera/Conjunctiva - Bilateral-No scleral icterus.  Chest and Lung Exam Chest and lung exam reveals -quiet, even and easy respiratory effort with no use of accessory muscles and on auscultation, normal breath sounds, no adventitious sounds and normal vocal resonance. Inspection Chest Wall - Normal. Back - normal.  Breast - Did not examine.  Cardiovascular Cardiovascular examination reveals -normal heart sounds, regular rate and rhythm with no murmurs and normal pedal pulses bilaterally.  Abdomen Inspection Inspection of the abdomen reveals - No Hernias. Skin - Scar - no surgical scars. Palpation/Percussion Palpation and Percussion of the abdomen reveal - Soft, Non Tender, No Rebound tenderness, No Rigidity (guarding) and No  hepatosplenomegaly. Auscultation Auscultation of the abdomen reveals - Bowel sounds normal.  Peripheral Vascular Upper Extremity Palpation - Pulses bilaterally normal.  Neurologic Neurologic evaluation reveals -alert and oriented x 3 with no impairment of recent or remote memory. Mental Status-Normal.  Neuropsychiatric The patient's mood  and affect are described as -normal. Judgment and Insight-insight is appropriate concerning matters relevant to self.  Musculoskeletal Normal Exam - Left-Upper Extremity Strength Normal and Lower Extremity Strength Normal. Normal Exam - Right-Upper Extremity Strength Normal and Lower Extremity Strength Normal.  Lymphatic Head & Neck  General Head & Neck Lymphatics: Bilateral - Description - Normal. Axillary - Did not examine. Femoral & Inguinal - Did not examine.    Assessment & Plan Randall Hiss M. Kenasia Scheller MD; 08/21/2020 11:34 AM)  SEVERE OBESITY (E66.01) Story: I think the patient is an acceptable candidate for laparoscopic sleeve gastrectomy. We reviewed her workup 2 mo ago. She had a normal upper GI last summer, chest x-ray and EKG. She has completed her psychological evaluation. She has worked with mental health providers regarding her binge eating history and has been cleared from a psychological standpoint with respect to her binge eating behavior which is now resolved. She had recent labs which reviewed as well. She had a medical exam just about 2 mo ago from her PCP.  This patient encounter took 37 minutes today to perform the following: take history, perform exam, review outside records, interpret imaging, counsel the patient on their diagnosis and document encounter, findings & plan in the EHR Impression: The patient meets weight loss surgery criteria. I think the patient would be an acceptable candidate for Laparoscopic vertical sleeve gastrectomy.  We previously discussed sleeve gastrectomy along with risk and benefits and  behaviors on lifestyle changes needed at her last visit. She has completed her bariatric surgery evaluation process.  I offered to rediscussed steps along with risk and benefits but she declined today. I answered all her additional remaining questions.  I did inform her that I would be performing her procedure using the surgical da Vinci robot. I explained how that works.  We rediscussed the typical things that we see after surgery. We discussed the importance of the preoperative meal plan. I congratulated her on the weight that she has loss thus far.  This patient encounter took 20 minutes today to perform the following: take history, perform exam, review outside records, interpret imaging, counsel the patient on their diagnosis and document encounter, findings & plan in the EHR  Current Plans Pt Education - EMW_preopbariatric  HYPERTRIGLYCERIDEMIA (E78.1)   GASTROESOPHAGEAL REFLUX DISEASE, UNSPECIFIED WHETHER ESOPHAGITIS PRESENT (K21.9) Impression: We did discuss that for could worsen after sleeve gastrectomy   ESSENTIAL HYPERTENSION (I10)   DIABETES MELLITUS TYPE 2 IN OBESE (E11.69)   OBSTRUCTIVE SLEEP APNEA ON CPAP (G47.33)  [name]. Redmond Pulling, MD, FACS General, Bariatric, & Minimally Invasive Surgery [facility], [place]

## 2020-08-21 NOTE — H&P (View-Only) (Signed)
Julia Lynch Appointment: 08/21/2020 11:00 AM Location: [facility] Patient #: [id number] DOB: 01/16/1976 Married / Language: English / Race: White Female  History of Present Illness (Julia Mcclatchey M. Rajvir Ernster MD; 08/21/2020 11:31 AM) The patient is a 43 year old female who presents with obesity. She comes in for clinic visit. She denies any medical changes since I last saw her. She has been doing well on ozempic for diabetes as well as weight control. She denies any binge eating behavior since one year ago. She denies any chest pain, chest pressure, source of breath, orthopnea, paroxysmal nocturnal dyspnea. She denies any trips the emergency room hospital. She is still taking omeprazole. She denies any pain with eating or drinking.  06/27/20 Patient comes in for long-term follow-up regarding her severe obesity. She initially met one of my partners in September 2019 he has since retired so her care was transferred to me. She is interested in sleeve gastrectomy. She has completed the bariatric surgery evaluation process. She denies any medical changes since we last saw her. She denies any trips the emergency room or hospital.  Comorbidities include GERD, right knee osteoarthritis, left ankle osteoarthritis, diabetes mellitus type 2, obstructive sleep apnea on CPAP, hypertension  She denies any chest pain, chest pressure, shows of breath, orthopnea, paroxysmal nocturnal dyspnea, dyspnea on exertion, personal family history of blood clots. She does use CPAP. She does take omeprazole for GERD. It is well controlled. It mainly occurs with foods that are spicy or have seeds. She has a daily bowel movement. No melena or hematochezia. She has pain in her left ankle, right knee, right shoulder and right hip. She has occasional headaches twice a month. No TIAs or amaurosis fugax.  Negative tobacco or drugs. She drinks alcohol socially. She works for [employer].  She had labs  recently which I reviewed. CBC and comprehensive metabolic panel were unremarkable except for a blood sugar of 183. A1c was 8.9. She was placed on ozempic. Total cholesterol 200, triglyceride 319, LDL 108, HDL 37. She has received psychological clearance. EKG, chest x-ray and upper GI were unremarkable last year.   06/2018 Patient is referred by Dr Jennifer Burnett PA for consideration for surgical treatment for morbid obesity. The patient gives a history of progressive obesity since adolescence despite multiple attempts at medical management. she has been through numerous efforts at physician supervised weight loss, medications and diet programs with some temporary success but then typically progressive weight regain. This has gone on for many years. She has come to the realization she is not going to be able to do it alone. Obesity has been affecting the patient in a number of ways including pain from arthritis in her ankle and difficulty with routine activities and enjoying activities with her children.. Significant co-morbid illnesses have developed including adult onset diabetes currently diet-controlled, obstructive sleep apnea, hypertension and moderate GERD. The patient has been to our initial information seminar, researched surgical options thoroughly, and is interested in sleeve gastrectomy due to the fewer intestinal side effects and lack of malabsorption.    Problem List/Past Medical (Julia Hollister M. Jaskirat Schwieger, MD; 08/21/2020 11:34 AM) HYPERTRIGLYCERIDEMIA (E78.1) PRE-OP TESTING (Z01.818) SEVERE OBESITY (E66.01) I think the patient is an acceptable candidate for laparoscopic sleeve gastrectomy. We reviewed her workup 2 mo ago. She had a normal upper GI last summer, chest x-ray and EKG. She has completed her psychological evaluation. She has worked with mental health providers regarding her binge eating history and has been cleared from a psychological   standpoint with respect to her binge  eating behavior which is now resolved. She had recent labs which reviewed as well. She had a medical exam just about 2 mo ago from her PCP.  This patient encounter took 37 minutes today to perform the following: take history, perform exam, review outside records, interpret imaging, counsel the patient on their diagnosis and document encounter, findings & plan in the EHR  Past Surgical History (Julia Scogin M. Amia Rynders, MD; 08/21/2020 11:34 AM) Foot Surgery Left.  Diagnostic Studies History (Julia Schellenberg M. Howell Groesbeck, MD; 08/21/2020 11:34 AM) Colonoscopy 1-5 years ago Pap Smear 1-5 years ago  Allergies (Julia Herrin, RN; 08/21/2020 11:05 AM) Ibuprofen *ANALGESICS - ANTI-INFLAMMATORY* Lodine *ANALGESICS - ANTI-INFLAMMATORY* Allergies Reconciled  Medication History (Julia Herrin, RN; 08/21/2020 11:09 AM) Multivitamin (Oral) Active. Ozempic (0.25 or 0.5 MG/DOSE) (2MG/1.5ML Soln Pen-inj, .5 Subcutaneous once a week) Active. Citalopram Hydrobromide (40MG Tablet, Oral) Active. Cyclobenzaprine HCl (5MG Tablet, Oral) Active. diazePAM (5MG Tablet, Oral) Active. Omeprazole (20MG Capsule DR, Oral) Active. valACYclovir HCl (500MG Tablet, Oral) Active. Vitamin D (Ergocalciferol) (1.25 MG(50000 UT) Capsule, Oral) Active. Lisinopril-hydroCHLOROthiazide (20-25MG Tablet, Oral) Active. Biotin (5000MCG Tablet, Oral) Active. Medications Reconciled  Social History (Julia Pryde M. Lochlann Mastrangelo, MD; 08/21/2020 11:34 AM) Alcohol use Occasional alcohol use. Caffeine use Tea. No drug use Tobacco use Former smoker.  Family History (Julia Thebeau M. Shakila Mak, MD; 08/21/2020 11:34 AM) Depression Family Members In General. Diabetes Mellitus Father. Hypertension Family Members In General.  Pregnancy / Birth History (Julia Lukacs M. Piotr Christopher, MD; 08/21/2020 11:34 AM) Age at menarche 10 years. Gravida 4 Para 3 Regular periods  Other Problems (Renleigh Ouellet M. Grantham Hippert, MD; 08/21/2020 11:34 AM) Anxiety  Disorder Arthritis Asthma Depression Hemorrhoids Home Oxygen Use GASTROESOPHAGEAL REFLUX DISEASE, UNSPECIFIED WHETHER ESOPHAGITIS PRESENT (K21.9) ESSENTIAL HYPERTENSION (I10) OBSTRUCTIVE SLEEP APNEA ON CPAP (G47.33) DIABETES MELLITUS TYPE 2 IN OBESE (E11.69)     Review of Systems (Kwame Ryland M. Wendee Hata MD; 08/21/2020 11:31 AM) All other systems negative  Vitals (Julia Herrin RN; 08/21/2020 11:09 AM) 08/21/2020 11:09 AM Weight: 280.38 lb Height: 61in Body Surface Area: 2.18 m Body Mass Index: 52.98 kg/m  Temp.: 97.8F  Pulse: 122 (Regular)  P.OX: 98% (Room air) BP: 124/84(Sitting, Left Arm, Standard)        Physical Exam (Domenic Schoenberger M. Maryella Abood MD; 08/21/2020 11:31 AM)  The physical exam findings are as follows: Note:central truncal obesity  General Mental Status-Alert. General Appearance-Consistent with stated age. Hydration-Well hydrated. Voice-Normal.  Head and Neck Head-normocephalic, atraumatic with no lesions or palpable masses. Trachea-midline. Thyroid Gland Characteristics - normal size and consistency.  Eye Eyeball - Bilateral-Extraocular movements intact. Sclera/Conjunctiva - Bilateral-No scleral icterus.  Chest and Lung Exam Chest and lung exam reveals -quiet, even and easy respiratory effort with no use of accessory muscles and on auscultation, normal breath sounds, no adventitious sounds and normal vocal resonance. Inspection Chest Wall - Normal. Back - normal.  Breast - Did not examine.  Cardiovascular Cardiovascular examination reveals -normal heart sounds, regular rate and rhythm with no murmurs and normal pedal pulses bilaterally.  Abdomen Inspection Inspection of the abdomen reveals - No Hernias. Skin - Scar - no surgical scars. Palpation/Percussion Palpation and Percussion of the abdomen reveal - Soft, Non Tender, No Rebound tenderness, No Rigidity (guarding) and No  hepatosplenomegaly. Auscultation Auscultation of the abdomen reveals - Bowel sounds normal.  Peripheral Vascular Upper Extremity Palpation - Pulses bilaterally normal.  Neurologic Neurologic evaluation reveals -alert and oriented x 3 with no impairment of recent or remote memory. Mental Status-Normal.  Neuropsychiatric The patient's mood   and affect are described as -normal. Judgment and Insight-insight is appropriate concerning matters relevant to self.  Musculoskeletal Normal Exam - Left-Upper Extremity Strength Normal and Lower Extremity Strength Normal. Normal Exam - Right-Upper Extremity Strength Normal and Lower Extremity Strength Normal.  Lymphatic Head & Neck  General Head & Neck Lymphatics: Bilateral - Description - Normal. Axillary - Did not examine. Femoral & Inguinal - Did not examine.    Assessment & Plan (Arlethia Basso M. Lily Kernen MD; 08/21/2020 11:34 AM)  SEVERE OBESITY (E66.01) Story: I think the patient is an acceptable candidate for laparoscopic sleeve gastrectomy. We reviewed her workup 2 mo ago. She had a normal upper GI last summer, chest x-ray and EKG. She has completed her psychological evaluation. She has worked with mental health providers regarding her binge eating history and has been cleared from a psychological standpoint with respect to her binge eating behavior which is now resolved. She had recent labs which reviewed as well. She had a medical exam just about 2 mo ago from her PCP.  This patient encounter took 37 minutes today to perform the following: take history, perform exam, review outside records, interpret imaging, counsel the patient on their diagnosis and document encounter, findings & plan in the EHR Impression: The patient meets weight loss surgery criteria. I think the patient would be an acceptable candidate for Laparoscopic vertical sleeve gastrectomy.  We previously discussed sleeve gastrectomy along with risk and benefits and  behaviors on lifestyle changes needed at her last visit. She has completed her bariatric surgery evaluation process.  I offered to rediscussed steps along with risk and benefits but she declined today. I answered all her additional remaining questions.  I did inform her that I would be performing her procedure using the surgical da Vinci robot. I explained how that works.  We rediscussed the typical things that we see after surgery. We discussed the importance of the preoperative meal plan. I congratulated her on the weight that she has loss thus far.  This patient encounter took 20 minutes today to perform the following: take history, perform exam, review outside records, interpret imaging, counsel the patient on their diagnosis and document encounter, findings & plan in the EHR  Current Plans Pt Education - EMW_preopbariatric  HYPERTRIGLYCERIDEMIA (E78.1)   GASTROESOPHAGEAL REFLUX DISEASE, UNSPECIFIED WHETHER ESOPHAGITIS PRESENT (K21.9) Impression: We did discuss that for could worsen after sleeve gastrectomy   ESSENTIAL HYPERTENSION (I10)   DIABETES MELLITUS TYPE 2 IN OBESE (E11.69)   OBSTRUCTIVE SLEEP APNEA ON CPAP (G47.33)  Shavaughn Seidl M. Burnell Matlin, MD, FACS General, Bariatric, & Minimally Invasive Surgery [facility]  

## 2020-08-22 HISTORY — PX: LAPAROSCOPIC GASTRIC SLEEVE RESECTION: SHX5895

## 2020-08-26 NOTE — Patient Instructions (Addendum)
DUE TO COVID-19 ONLY ONE VISITOR IS ALLOWED TO COME WITH YOU AND STAY IN THE WAITING ROOM ONLY DURING PRE OP AND PROCEDURE DAY OF SURGERY. THE 1 VISITOR  MAY VISIT WITH YOU AFTER SURGERY IN YOUR PRIVATE ROOM DURING VISITING HOURS ONLY!  YOU NEED TO HAVE A COVID 19 TEST ON_11/18______ @_11 :00______, THIS TEST MUST BE DONE BEFORE SURGERY,  COVID TESTING SITE [address] [place] , IT IS ON THE RIGHT GOING OUT [address] APPROXIMATELY  2 MINUTES PAST [employer] ON THE RIGHT. ONCE YOUR COVID TEST IS COMPLETED,  PLEASE BEGIN THE QUARANTINE INSTRUCTIONS AS OUTLINED IN YOUR HANDOUT.                Julia Lynch    Your procedure is scheduled on: 09/01/20   Report to [facility] Main  Entrance   Report to Short stay at 5:30 AM     Call this number if you have problems the morning of surgery [phone]   . BRUSH YOUR TEETH MORNING OF SURGERY AND RINSE YOUR MOUTH OUT, NO CHEWING GUM CANDY OR MINTS.      NO SOLID FOOD AFTER 6:00 PM THE NIGHT BEFORE YOUR SURGERY.   YOU MAY DRINK CLEAR FLUIDS until 4:30 AM   THE G2 DRINK YOU DRINK BEFORE YOU LEAVE HOME WILL BE THE LAST FLUIDS YOU DRINK BEFORE SURGERY.  PAIN IS EXPECTED AFTER SURGERY AND WILL NOT BE COMPLETELY ELIMINATED  . AMBULATION AND TYLENOL WILL HELP REDUCE INCISIONAL AND GAS PAIN. MOVEMENT IS KEY!  YOU ARE EXPECTED TO BE OUT OF BED WITHIN 4 HOURS OF ADMISSION TO YOUR PATIENT ROOM.  SITTING IN THE RECLINER THROUGHOUT THE DAY IS IMPORTANT FOR DRINKING FLUIDS AND MOVING GAS THROUGHOUT THE GI TRACT.  COMPRESSION STOCKINGS SHOULD BE WORN [facility] STAY UNLESS YOU ARE WALKING.   INCENTIVE SPIROMETER SHOULD BE USED EVERY HOUR WHILE AWAKE TO DECREASE POST-OPERATIVE COMPLICATIONS SUCH AS PNEUMONIA.  WHEN DISCHARGED HOME, IT IS IMPORTANT TO CONTINUE TO WALK EVERY HOUR AND USE THE INCENTIVE SPIROMETER EVERY HOUR.          Take these medicines the morning of surgery with A  SIP OF WATER: Citalopram, Omeprazole     How to Manage Your Diabetes Before and After Surgery  Why is it important to control my blood sugar before and after surgery? . Improving blood sugar levels before and after surgery helps healing and can limit problems. . A way of improving blood sugar control is eating a healthy diet by: o  Eating less sugar and carbohydrates o  Increasing activity/exercise o  Talking with your doctor about reaching your blood sugar goals . High blood sugars (greater than 180 mg/dL) can raise your risk of infections and slow your recovery, so you will need to focus on controlling your diabetes during the weeks before surgery. . Make sure that the doctor who takes care of your diabetes knows about your planned surgery including the date and location.  How do I manage my blood sugar before surgery? . Check your blood sugar at least 4 times a day, starting 2 days before surgery, to make sure that the level is not too high or low. o Check your blood sugar the morning of your surgery when you wake up and every 2 hours until you get to the Short Stay unit. . If your blood sugar is less than 70 mg/dL, you will need to treat for low blood sugar: o Do not  take insulin. o Treat a low blood sugar (less than 70 mg/dL) with  cup of clear juice (cranberry or apple), 4 glucose tablets, OR glucose gel. o Recheck blood sugar in 15 minutes after treatment (to make sure it is greater than 70 mg/dL). If your blood sugar is not greater than 70 mg/dL on recheck, call [phone] for further instructions. . Report your blood sugar to the short stay nurse when you get to Short Stay.  . If you are admitted to the hospital after surgery: o Your blood sugar will be checked by the staff and you will probably be given insulin after surgery (instead of oral diabetes medicines) to make sure you have good blood sugar levels. o The goal for blood sugar control after surgery is 80-180  mg/dL.   WHAT DO I DO ABOUT MY DIABETES MEDICATION?  Marland Kitchen Do not take oral diabetes medicines (pills) the morning of surgery.  .    The day of surgery, do not take other diabetes injectables, including Byetta (exenatide), Bydureon (exenatide ER), Victoza (liraglutide), or Trulicity (dulaglutide).    .                            You may not have any metal on your body including hair pins and              piercings  Do not wear jewelry, make-up, lotions, powders or perfumes, deodorant             Do not wear nail polish on your fingernails.  Do not shave  48 hours prior to surgery.                 Do not bring valuables to the hospital. [facility] IS NOT             RESPONSIBLE   FOR VALUABLES.  Contacts, dentures or bridgework may not be worn into surgery.       _____________________________________________________________________             [facility] - Preparing for Surgery Before surgery, you can play an important role.   Because skin is not sterile, your skin needs to be as free of germs as possible.   You can reduce the number of germs on your skin by washing with CHG (chlorahexidine gluconate) soap before surgery.   CHG is an antiseptic cleaner which kills germs and bonds with the skin to continue killing germs even after washing. Please DO NOT use if you have an allergy to CHG or antibacterial soaps.   If your skin becomes reddened/irritated stop using the CHG and inform your nurse when you arrive at Short Stay. Do not shave (including legs and underarms) for at least 48 hours prior to the first CHG shower.   Please follow these instructions carefully:  1.  Shower with CHG Soap the night before surgery and the  morning of Surgery.  2.  If you choose to wash your hair, wash your hair first as usual with your  normal  shampoo.  3.  After you shampoo, rinse your hair and body thoroughly to remove the  shampoo.                                        4.  Use CHG as you would  any other liquid soap.  You can apply chg directly  to the skin and wash                       Gently with a scrungie or clean washcloth.  5.  Apply the CHG Soap to your body ONLY FROM THE NECK DOWN.   Do not use on face/ open                           Wound or open sores. Avoid contact with eyes, ears mouth and genitals (private parts).                       Wash face,  Genitals (private parts) with your normal soap.             6.  Wash thoroughly, paying special attention to the area where your surgery  will be performed.  7.  Thoroughly rinse your body with warm water from the neck down.  8.  DO NOT shower/wash with your normal soap after using and rinsing off  the CHG Soap.             9.  Pat yourself dry with a clean towel.            10.  Wear clean pajamas.            11.  Place clean sheets on your bed the night of your first shower and do not  sleep with pets. Day of Surgery : Do not apply any lotions/deodorants the morning of surgery.  Please wear clean clothes to the hospital/surgery center.  FAILURE TO FOLLOW THESE INSTRUCTIONS MAY RESULT IN THE CANCELLATION OF YOUR SURGERY PATIENT SIGNATURE_________________________________  NURSE SIGNATURE__________________________________  ________________________________________________________________________

## 2020-08-27 ENCOUNTER — Other Ambulatory Visit: Payer: Self-pay

## 2020-08-27 ENCOUNTER — Encounter (HOSPITAL_COMMUNITY): Payer: Self-pay

## 2020-08-27 ENCOUNTER — Encounter (HOSPITAL_COMMUNITY)
Admission: RE | Admit: 2020-08-27 | Discharge: 2020-08-27 | Disposition: A | Discharge status: Home / Self Care | Payer: BC Managed Care – PPO | Source: Ambulatory Visit | Attending: General Surgery | Admitting: General Surgery

## 2020-08-27 DIAGNOSIS — Z01812 Encounter for preprocedural laboratory examination: Secondary | ICD-10-CM | POA: Diagnosis present

## 2020-08-27 LAB — COMPREHENSIVE METABOLIC PANEL
ALT: 63 U/L — ABNORMAL HIGH (ref 0–44)
AST: 45 U/L — ABNORMAL HIGH (ref 15–41)
Albumin: 3.9 g/dL (ref 3.5–5.0)
Alkaline Phosphatase: 81 U/L (ref 38–126)
Anion gap: 8 (ref 5–15)
BUN: 17 mg/dL (ref 6–20)
CO2: 27 mmol/L (ref 22–32)
Calcium: 9.1 mg/dL (ref 8.9–10.3)
Chloride: 101 mmol/L (ref 98–111)
Creatinine, Ser: 0.67 mg/dL (ref 0.44–1.00)
GFR, Estimated: >60 mL/min (ref >60)
Glucose, Bld: 164 mg/dL — ABNORMAL HIGH (ref 70–99)
Potassium: 4.1 mmol/L (ref 3.5–5.1)
Sodium: 136 mmol/L (ref 135–145)
Total Bilirubin: 0.6 mg/dL (ref 0.3–1.2)
Total Protein: 7.1 g/dL (ref 6.5–8.1)

## 2020-08-27 LAB — CBC WITH DIFFERENTIAL/PLATELET
Abs Immature Granulocytes: 0.02 10*3/uL (ref 0.00–0.07)
Basophils Absolute: 0 10*3/uL (ref 0.0–0.1)
Basophils Relative: 1 %
Eosinophils Absolute: 0.1 10*3/uL (ref 0.0–0.5)
Eosinophils Relative: 1 %
HCT: 42.3 % (ref 36.0–46.0)
Hemoglobin: 14.5 g/dL (ref 12.0–15.0)
Immature Granulocytes: 0 %
Lymphocytes Relative: 34 %
Lymphs Abs: 2.7 10*3/uL (ref 0.7–4.0)
MCH: 28.6 pg (ref 26.0–34.0)
MCHC: 34.3 g/dL (ref 30.0–36.0)
MCV: 83.4 fL (ref 80.0–100.0)
Monocytes Absolute: 0.6 10*3/uL (ref 0.1–1.0)
Monocytes Relative: 8 %
Neutro Abs: 4.6 10*3/uL (ref 1.7–7.7)
Neutrophils Relative %: 56 %
Platelets: 293 10*3/uL (ref 150–400)
RBC: 5.07 MIL/uL (ref 3.87–5.11)
RDW: 13.2 % (ref 11.5–15.5)
WBC: 8 10*3/uL (ref 4.0–10.5)
nRBC: 0 % (ref 0.0–0.2)

## 2020-08-27 LAB — HEMOGLOBIN A1C
Hgb A1c MFr Bld: 7.6 % — ABNORMAL HIGH (ref 4.8–5.6)
Mean Plasma Glucose: 171.42 mg/dL

## 2020-08-27 NOTE — Progress Notes (Signed)
COVID Vaccine Completed:Yes Date COVID Vaccine completed:02/21/20 COVID vaccine manufacturer Moderna     PCP -Joycelyn Man PA Cardiologist - none  Chest x-ray - no EKG - 03/05/20-Epic Stress Test - no ECHO - no Cardiac Cath - no Pacemaker/ICD device last checked:NA  Sleep Study - yes CPAP - yes  Fasting Blood Sugar - 170-180 Checks Blood Sugar _____ times a day once per week  Blood Thinner Instructions:NA Aspirin Instructions: Last Dose:  Anesthesia review:   Patient denies shortness of breath, fever, cough and chest pain at PAT appointmentYes   Patient verbalized understanding of instructions that were given to them at the PAT appointment. Patient was also instructed that they will need to review over the PAT instructions again at home before surgery. Yes Pt can climb 1 flight of stairs, do housework and ADLs without SOB.She hasn't had problems with Asthma since 2006.

## 2020-08-28 ENCOUNTER — Other Ambulatory Visit (HOSPITAL_COMMUNITY)
Admission: RE | Admit: 2020-08-28 | Discharge: 2020-08-28 | Disposition: A | Discharge status: Home / Self Care | Payer: BC Managed Care – PPO | Source: Ambulatory Visit | Attending: General Surgery | Admitting: General Surgery

## 2020-08-28 DIAGNOSIS — Z01812 Encounter for preprocedural laboratory examination: Secondary | ICD-10-CM | POA: Diagnosis present

## 2020-08-28 DIAGNOSIS — Z20822 Contact with and (suspected) exposure to covid-19: Secondary | ICD-10-CM | POA: Diagnosis not present

## 2020-08-29 LAB — SARS CORONAVIRUS 2 (TAT 6-24 HRS): SARS Coronavirus 2: NEGATIVE

## 2020-08-30 ENCOUNTER — Encounter (HOSPITAL_COMMUNITY): Payer: Self-pay | Admitting: General Surgery

## 2020-08-30 NOTE — Anesthesia Preprocedure Evaluation (Addendum)
Anesthesia Evaluation  Patient identified by MRN, date of birth, ID band Patient awake    Reviewed: Allergy & Precautions, NPO status , Patient's Chart, lab work & pertinent test results  Airway Mallampati: II  TM Distance: >3 FB Neck ROM: Full    Dental no notable dental hx. (+) Teeth Intact   Pulmonary shortness of breath, asthma , sleep apnea and Continuous Positive Airway Pressure Ventilation , former smoker,    Pulmonary exam normal breath sounds clear to auscultation       Cardiovascular hypertension, Pt. on medications Normal cardiovascular exam Rhythm:Regular Rate:Normal     Neuro/Psych  Headaches, PSYCHIATRIC DISORDERS Anxiety Depression    GI/Hepatic Neg liver ROS, GERD  Medicated and Controlled,  Endo/Other  diabetes, Poorly Controlled, Type 2, Insulin DependentMorbid obesityHyperlipidemia PCOS  Renal/GU Hx/o renal calculi  negative genitourinary   Musculoskeletal  (+) Arthritis , Osteoarthritis,    Abdominal (+) + obese,   Peds  Hematology negative hematology ROS (+)   Anesthesia Other Findings   Reproductive/Obstetrics negative OB ROS HSV                           Anesthesia Physical Anesthesia Plan  ASA: III  Anesthesia Plan: General   Post-op Pain Management:    Induction: Intravenous, Rapid sequence and Cricoid pressure planned  PONV Risk Score and Plan: 4 or greater and Scopolamine patch - Pre-op, Treatment may vary due to age or medical condition, Midazolam, Ondansetron and Dexamethasone  Airway Management Planned: Oral ETT  Additional Equipment:   Intra-op Plan:   Post-operative Plan: Extubation in OR  Informed Consent: I have reviewed the patients History and Physical, chart, labs and discussed the procedure including the risks, benefits and alternatives for the proposed anesthesia with the patient or authorized representative who has indicated his/her  understanding and acceptance.     Dental advisory given  Plan Discussed with: CRNA and Anesthesiologist  Anesthesia Plan Comments:        Anesthesia Quick Evaluation

## 2020-08-31 MED ORDER — BUPIVACAINE LIPOSOME 1.3 % IJ SUSP
20.0000 mL | Freq: Once | INTRAMUSCULAR | Status: DC
  Filled 2020-08-31: qty 20

## 2020-09-01 ENCOUNTER — Inpatient Hospital Stay (HOSPITAL_COMMUNITY)
Admission: RE | Admit: 2020-09-01 | Discharge: 2020-09-02 | DRG: 621 | Disposition: A | Discharge status: Home / Self Care | Payer: BC Managed Care – PPO | Attending: General Surgery | Admitting: General Surgery

## 2020-09-01 ENCOUNTER — Other Ambulatory Visit: Payer: Self-pay

## 2020-09-01 ENCOUNTER — Inpatient Hospital Stay (HOSPITAL_COMMUNITY): Payer: BC Managed Care – PPO | Admitting: Certified Registered Nurse Anesthetist

## 2020-09-01 ENCOUNTER — Encounter (HOSPITAL_COMMUNITY)
Admission: RE | Disposition: A | Discharge status: Home / Self Care | Payer: Self-pay | Source: Home / Self Care | Attending: General Surgery

## 2020-09-01 ENCOUNTER — Encounter (HOSPITAL_COMMUNITY): Payer: Self-pay | Admitting: General Surgery

## 2020-09-01 DIAGNOSIS — I10 Essential (primary) hypertension: Secondary | ICD-10-CM | POA: Diagnosis present

## 2020-09-01 DIAGNOSIS — Z833 Family history of diabetes mellitus: Secondary | ICD-10-CM | POA: Diagnosis not present

## 2020-09-01 DIAGNOSIS — M19072 Primary osteoarthritis, left ankle and foot: Secondary | ICD-10-CM | POA: Diagnosis present

## 2020-09-01 DIAGNOSIS — Z87891 Personal history of nicotine dependence: Secondary | ICD-10-CM

## 2020-09-01 DIAGNOSIS — Z9884 Bariatric surgery status: Secondary | ICD-10-CM

## 2020-09-01 DIAGNOSIS — J45909 Unspecified asthma, uncomplicated: Secondary | ICD-10-CM | POA: Diagnosis present

## 2020-09-01 DIAGNOSIS — E7800 Pure hypercholesterolemia, unspecified: Secondary | ICD-10-CM | POA: Diagnosis present

## 2020-09-01 DIAGNOSIS — M1711 Unilateral primary osteoarthritis, right knee: Secondary | ICD-10-CM | POA: Diagnosis present

## 2020-09-01 DIAGNOSIS — M1990 Unspecified osteoarthritis, unspecified site: Secondary | ICD-10-CM | POA: Diagnosis present

## 2020-09-01 DIAGNOSIS — E782 Mixed hyperlipidemia: Secondary | ICD-10-CM | POA: Diagnosis present

## 2020-09-01 DIAGNOSIS — K219 Gastro-esophageal reflux disease without esophagitis: Secondary | ICD-10-CM | POA: Diagnosis present

## 2020-09-01 DIAGNOSIS — Z6841 Body Mass Index (BMI) 40.0 and over, adult: Secondary | ICD-10-CM | POA: Diagnosis not present

## 2020-09-01 DIAGNOSIS — E119 Type 2 diabetes mellitus without complications: Secondary | ICD-10-CM | POA: Diagnosis present

## 2020-09-01 DIAGNOSIS — E781 Pure hyperglyceridemia: Secondary | ICD-10-CM | POA: Diagnosis present

## 2020-09-01 DIAGNOSIS — E66813 Obesity, class 3: Secondary | ICD-10-CM

## 2020-09-01 DIAGNOSIS — G4733 Obstructive sleep apnea (adult) (pediatric): Secondary | ICD-10-CM | POA: Diagnosis present

## 2020-09-01 HISTORY — PX: UPPER GI ENDOSCOPY: SHX6162

## 2020-09-01 LAB — HEMOGLOBIN AND HEMATOCRIT, BLOOD
HCT: 40.8 % (ref 36.0–46.0)
Hemoglobin: 13.6 g/dL (ref 12.0–15.0)

## 2020-09-01 LAB — GLUCOSE, CAPILLARY
Glucose-Capillary: 176 mg/dL — ABNORMAL HIGH (ref 70–99)
Glucose-Capillary: 231 mg/dL — ABNORMAL HIGH (ref 70–99)
Glucose-Capillary: 217 mg/dL — ABNORMAL HIGH (ref 70–99)
Glucose-Capillary: 195 mg/dL — ABNORMAL HIGH (ref 70–99)

## 2020-09-01 LAB — TYPE AND SCREEN
ABO/RH(D): O POS
Antibody Screen: NEGATIVE

## 2020-09-01 LAB — ABO/RH: ABO/RH(D): O POS

## 2020-09-01 LAB — PREGNANCY, URINE: Preg Test, Ur: NEGATIVE

## 2020-09-01 SURGERY — XI ROBOTIC GASTRIC SLEEVE RESECTION
Anesthesia: General | Site: Abdomen

## 2020-09-01 MED ORDER — ENSURE MAX PROTEIN PO LIQD
2.0000 [oz_av] | ORAL | Status: DC
  Administered 2020-09-02 (×3): 2 [oz_av] via ORAL

## 2020-09-01 MED ORDER — GABAPENTIN 300 MG PO CAPS
300.0000 mg | ORAL_CAPSULE | ORAL | Status: AC
  Administered 2020-09-01: 300 mg via ORAL
  Filled 2020-09-01: qty 1

## 2020-09-01 MED ORDER — MIDAZOLAM HCL 5 MG/5ML IJ SOLN
INTRAMUSCULAR | Status: DC | PRN
  Administered 2020-09-01: 2 mg via INTRAVENOUS

## 2020-09-01 MED ORDER — FENTANYL CITRATE (PF) 100 MCG/2ML IJ SOLN: 25.0000 ug | INTRAMUSCULAR | Status: DC | PRN

## 2020-09-01 MED ORDER — SODIUM CHLORIDE (PF) 0.9 % IJ SOLN
INTRAMUSCULAR | Status: DC | PRN
  Administered 2020-09-01: 50 mL

## 2020-09-01 MED ORDER — ENOXAPARIN SODIUM 30 MG/0.3ML ~~LOC~~ SOLN
30.0000 mg | Freq: Two times a day (BID) | SUBCUTANEOUS | Status: DC
  Administered 2020-09-01 – 2020-09-02 (×2): 30 mg via SUBCUTANEOUS
  Filled 2020-09-01 (×2): qty 0.3

## 2020-09-01 MED ORDER — ROCURONIUM BROMIDE 10 MG/ML (PF) SYRINGE
PREFILLED_SYRINGE | INTRAVENOUS | Status: AC
  Filled 2020-09-01: qty 10

## 2020-09-01 MED ORDER — APREPITANT 40 MG PO CAPS
40.0000 mg | ORAL_CAPSULE | ORAL | Status: AC
  Administered 2020-09-01: 40 mg via ORAL
  Filled 2020-09-01: qty 1

## 2020-09-01 MED ORDER — PHENYLEPHRINE HCL (PRESSORS) 10 MG/ML IV SOLN
INTRAVENOUS | Status: AC
  Filled 2020-09-01: qty 1

## 2020-09-01 MED ORDER — FENTANYL CITRATE (PF) 100 MCG/2ML IJ SOLN
INTRAMUSCULAR | Status: AC
  Administered 2020-09-01: 25 ug via INTRAVENOUS
  Filled 2020-09-01: qty 2

## 2020-09-01 MED ORDER — INSULIN ASPART 100 UNIT/ML ~~LOC~~ SOLN
0.0000 [IU] | SUBCUTANEOUS | Status: DC
  Administered 2020-09-01 (×2): 7 [IU] via SUBCUTANEOUS
  Administered 2020-09-01 – 2020-09-02 (×2): 4 [IU] via SUBCUTANEOUS
  Administered 2020-09-02 (×2): 3 [IU] via SUBCUTANEOUS

## 2020-09-01 MED ORDER — HYDRALAZINE HCL 20 MG/ML IJ SOLN
20.0000 mg | INTRAMUSCULAR | Status: DC | PRN
  Filled 2020-09-01: qty 1

## 2020-09-01 MED ORDER — LACTATED RINGERS IR SOLN
Status: DC | PRN
  Administered 2020-09-01: 1000 mL

## 2020-09-01 MED ORDER — LACTATED RINGERS IV SOLN: INTRAVENOUS | Status: DC

## 2020-09-01 MED ORDER — DEXAMETHASONE SODIUM PHOSPHATE 10 MG/ML IJ SOLN
INTRAMUSCULAR | Status: AC
  Filled 2020-09-01: qty 1

## 2020-09-01 MED ORDER — LISINOPRIL-HYDROCHLOROTHIAZIDE 20-25 MG PO TABS: 1.0000 | ORAL_TABLET | Freq: Every day | ORAL | Status: DC

## 2020-09-01 MED ORDER — PROMETHAZINE HCL 25 MG/ML IJ SOLN: 12.5000 mg | Freq: Four times a day (QID) | INTRAMUSCULAR | Status: DC | PRN

## 2020-09-01 MED ORDER — ONDANSETRON HCL 4 MG/2ML IJ SOLN: 4.0000 mg | Freq: Once | INTRAMUSCULAR | Status: DC | PRN

## 2020-09-01 MED ORDER — 0.9 % SODIUM CHLORIDE (POUR BTL) OPTIME
TOPICAL | Status: DC | PRN
  Administered 2020-09-01: 1000 mL

## 2020-09-01 MED ORDER — PROPOFOL 10 MG/ML IV BOLUS
INTRAVENOUS | Status: DC | PRN
  Administered 2020-09-01: 200 mg via INTRAVENOUS

## 2020-09-01 MED ORDER — BUPIVACAINE-EPINEPHRINE (PF) 0.25% -1:200000 IJ SOLN
INTRAMUSCULAR | Status: AC
  Filled 2020-09-01: qty 30

## 2020-09-01 MED ORDER — SIMETHICONE 80 MG PO CHEW
80.0000 mg | CHEWABLE_TABLET | Freq: Four times a day (QID) | ORAL | Status: DC | PRN
  Administered 2020-09-02: 80 mg via ORAL
  Filled 2020-09-01: qty 1

## 2020-09-01 MED ORDER — PANTOPRAZOLE SODIUM 40 MG IV SOLR
40.0000 mg | Freq: Every day | INTRAVENOUS | Status: DC
  Administered 2020-09-01: 40 mg via INTRAVENOUS
  Filled 2020-09-01: qty 40

## 2020-09-01 MED ORDER — OXYCODONE HCL 5 MG/5ML PO SOLN
5.0000 mg | Freq: Four times a day (QID) | ORAL | Status: DC | PRN
  Administered 2020-09-02: 5 mg via ORAL
  Filled 2020-09-01: qty 5

## 2020-09-01 MED ORDER — SUCCINYLCHOLINE CHLORIDE 200 MG/10ML IV SOSY
PREFILLED_SYRINGE | INTRAVENOUS | Status: DC | PRN
  Administered 2020-09-01: 140 mg via INTRAVENOUS

## 2020-09-01 MED ORDER — SUGAMMADEX SODIUM 500 MG/5ML IV SOLN
INTRAVENOUS | Status: DC | PRN
  Administered 2020-09-01: 300 mg via INTRAVENOUS

## 2020-09-01 MED ORDER — PROPOFOL 10 MG/ML IV BOLUS
INTRAVENOUS | Status: AC
  Filled 2020-09-01: qty 20

## 2020-09-01 MED ORDER — ORAL CARE MOUTH RINSE: 15.0000 mL | Freq: Once | OROMUCOSAL | Status: AC

## 2020-09-01 MED ORDER — KETAMINE HCL 10 MG/ML IJ SOLN
INTRAMUSCULAR | Status: AC
  Filled 2020-09-01: qty 1

## 2020-09-01 MED ORDER — CHLORHEXIDINE GLUCONATE 4 % EX LIQD: 60.0000 mL | Freq: Once | CUTANEOUS | Status: DC

## 2020-09-01 MED ORDER — PHENYLEPHRINE 40 MCG/ML (10ML) SYRINGE FOR IV PUSH (FOR BLOOD PRESSURE SUPPORT)
PREFILLED_SYRINGE | INTRAVENOUS | Status: AC
  Filled 2020-09-01: qty 20

## 2020-09-01 MED ORDER — MIDAZOLAM HCL 2 MG/2ML IJ SOLN
INTRAMUSCULAR | Status: AC
  Filled 2020-09-01: qty 2

## 2020-09-01 MED ORDER — ONDANSETRON HCL 4 MG/2ML IJ SOLN
INTRAMUSCULAR | Status: DC | PRN
  Administered 2020-09-01: 4 mg via INTRAVENOUS

## 2020-09-01 MED ORDER — POTASSIUM CHLORIDE IN NACL 20-0.9 MEQ/L-% IV SOLN
INTRAVENOUS | Status: DC
  Filled 2020-09-01 (×4): qty 1000

## 2020-09-01 MED ORDER — SCOPOLAMINE 1 MG/3DAYS TD PT72
1.0000 | MEDICATED_PATCH | TRANSDERMAL | Status: DC
  Administered 2020-09-01: 1.5 mg via TRANSDERMAL
  Filled 2020-09-01: qty 1

## 2020-09-01 MED ORDER — MORPHINE SULFATE (PF) 2 MG/ML IV SOLN
1.0000 mg | INTRAVENOUS | Status: DC | PRN
  Administered 2020-09-01: 2 mg via INTRAVENOUS
  Filled 2020-09-01: qty 1

## 2020-09-01 MED ORDER — FENTANYL CITRATE (PF) 250 MCG/5ML IJ SOLN
INTRAMUSCULAR | Status: AC
  Filled 2020-09-01: qty 5

## 2020-09-01 MED ORDER — ACETAMINOPHEN 500 MG PO TABS
1000.0000 mg | ORAL_TABLET | Freq: Three times a day (TID) | ORAL | Status: DC
  Administered 2020-09-01 – 2020-09-02 (×2): 1000 mg via ORAL
  Filled 2020-09-01 (×2): qty 2

## 2020-09-01 MED ORDER — HEPARIN SODIUM (PORCINE) 5000 UNIT/ML IJ SOLN
5000.0000 [IU] | INTRAMUSCULAR | Status: AC
  Administered 2020-09-01: 5000 [IU] via SUBCUTANEOUS
  Filled 2020-09-01: qty 1

## 2020-09-01 MED ORDER — EPHEDRINE SULFATE-NACL 50-0.9 MG/10ML-% IV SOSY
PREFILLED_SYRINGE | INTRAVENOUS | Status: DC | PRN
  Administered 2020-09-01: 10 mg via INTRAVENOUS

## 2020-09-01 MED ORDER — DEXAMETHASONE SODIUM PHOSPHATE 10 MG/ML IJ SOLN
INTRAMUSCULAR | Status: DC | PRN
  Administered 2020-09-01: 5 mg via INTRAVENOUS

## 2020-09-01 MED ORDER — SODIUM CHLORIDE (PF) 0.9 % IJ SOLN
INTRAMUSCULAR | Status: AC
  Filled 2020-09-01: qty 50

## 2020-09-01 MED ORDER — KETAMINE HCL 10 MG/ML IJ SOLN
INTRAMUSCULAR | Status: DC | PRN
  Administered 2020-09-01: 30 mg via INTRAVENOUS

## 2020-09-01 MED ORDER — ACETAMINOPHEN 160 MG/5ML PO SOLN
1000.0000 mg | Freq: Three times a day (TID) | ORAL | Status: DC
  Administered 2020-09-01: 13:00:00 1000 mg via ORAL
  Filled 2020-09-01 (×2): qty 40.6

## 2020-09-01 MED ORDER — CHLORHEXIDINE GLUCONATE 0.12 % MT SOLN
15.0000 mL | Freq: Once | OROMUCOSAL | Status: AC
  Administered 2020-09-01: 15 mL via OROMUCOSAL

## 2020-09-01 MED ORDER — DIPHENHYDRAMINE HCL 50 MG/ML IJ SOLN: 12.5000 mg | Freq: Three times a day (TID) | INTRAMUSCULAR | Status: DC | PRN

## 2020-09-01 MED ORDER — HYDROCHLOROTHIAZIDE 12.5 MG PO CAPS
12.5000 mg | ORAL_CAPSULE | Freq: Every day | ORAL | Status: DC
  Administered 2020-09-02: 09:00:00 12.5 mg via ORAL
  Filled 2020-09-01 (×2): qty 1

## 2020-09-01 MED ORDER — DEXAMETHASONE SODIUM PHOSPHATE 4 MG/ML IJ SOLN: 4.0000 mg | INTRAMUSCULAR | Status: DC

## 2020-09-01 MED ORDER — FENTANYL CITRATE (PF) 250 MCG/5ML IJ SOLN
INTRAMUSCULAR | Status: DC | PRN
Start: 2020-09-01 — End: 2020-09-01
  Administered 2020-09-01: 50 ug via INTRAVENOUS
  Administered 2020-09-01: 100 ug via INTRAVENOUS

## 2020-09-01 MED ORDER — ONDANSETRON HCL 4 MG/2ML IJ SOLN: 4.0000 mg | Freq: Four times a day (QID) | INTRAMUSCULAR | Status: DC | PRN

## 2020-09-01 MED ORDER — ROCURONIUM BROMIDE 10 MG/ML (PF) SYRINGE
PREFILLED_SYRINGE | INTRAVENOUS | Status: DC | PRN
  Administered 2020-09-01: 60 mg via INTRAVENOUS
  Administered 2020-09-01: 20 mg via INTRAVENOUS
  Administered 2020-09-01: 10 mg via INTRAVENOUS

## 2020-09-01 MED ORDER — GABAPENTIN 100 MG PO CAPS
200.0000 mg | ORAL_CAPSULE | Freq: Two times a day (BID) | ORAL | Status: DC
  Administered 2020-09-01 – 2020-09-02 (×3): 200 mg via ORAL
  Filled 2020-09-01 (×4): qty 2

## 2020-09-01 MED ORDER — LIDOCAINE 2% (20 MG/ML) 5 ML SYRINGE
INTRAMUSCULAR | Status: DC | PRN
  Administered 2020-09-01: 100 mg via INTRAVENOUS

## 2020-09-01 MED ORDER — BUPIVACAINE LIPOSOME 1.3 % IJ SUSP
INTRAMUSCULAR | Status: DC | PRN
  Administered 2020-09-01: 20 mL

## 2020-09-01 MED ORDER — ONDANSETRON HCL 4 MG/2ML IJ SOLN
INTRAMUSCULAR | Status: AC
  Filled 2020-09-01: qty 2

## 2020-09-01 MED ORDER — PHENYLEPHRINE 40 MCG/ML (10ML) SYRINGE FOR IV PUSH (FOR BLOOD PRESSURE SUPPORT)
PREFILLED_SYRINGE | INTRAVENOUS | Status: DC | PRN
  Administered 2020-09-01 (×2): 120 ug via INTRAVENOUS
  Administered 2020-09-01: 80 ug via INTRAVENOUS

## 2020-09-01 MED ORDER — ACETAMINOPHEN 500 MG PO TABS
1000.0000 mg | ORAL_TABLET | ORAL | Status: AC
  Administered 2020-09-01: 1000 mg via ORAL
  Filled 2020-09-01: qty 2

## 2020-09-01 MED ORDER — SODIUM CHLORIDE 0.9 % IV SOLN
2.0000 g | INTRAVENOUS | Status: AC
  Administered 2020-09-01: 2 g via INTRAVENOUS
  Filled 2020-09-01: qty 2

## 2020-09-01 MED ORDER — LIDOCAINE 2% (20 MG/ML) 5 ML SYRINGE
INTRAMUSCULAR | Status: AC
  Filled 2020-09-01: qty 5

## 2020-09-01 MED ORDER — LISINOPRIL 20 MG PO TABS
20.0000 mg | ORAL_TABLET | Freq: Every day | ORAL | Status: DC
  Administered 2020-09-02: 20 mg via ORAL
  Filled 2020-09-01 (×2): qty 1

## 2020-09-01 MED ORDER — LIDOCAINE 2% (20 MG/ML) 5 ML SYRINGE
INTRAMUSCULAR | Status: DC | PRN
  Administered 2020-09-01: 1.5 mg/kg/h via INTRAVENOUS

## 2020-09-01 SURGICAL SUPPLY — 66 items
APPLIER CLIP 5 13 M/L LIGAMAX5 (MISCELLANEOUS) ×3
APPLIER CLIP ROT 10 11.4 M/L (STAPLE)
BLADE SURG SZ11 CARB STEEL (BLADE) ×3 IMPLANT
BNDG ADH 1X3 SHEER STRL LF (GAUZE/BANDAGES/DRESSINGS) ×3 IMPLANT
CANNULA REDUC XI 12-8 STAPL (CANNULA) ×1
CANNULA REDUCER 12-8 DVNC XI (CANNULA) ×2 IMPLANT
CHLORAPREP W/TINT 26 (MISCELLANEOUS) ×3 IMPLANT
CLIP APPLIE 5 13 M/L LIGAMAX5 (MISCELLANEOUS) ×2 IMPLANT
CLIP APPLIE ROT 10 11.4 M/L (STAPLE) IMPLANT
COVER MAYO STAND STRL (DRAPES) ×3 IMPLANT
COVER SURGICAL LIGHT HANDLE (MISCELLANEOUS) ×3 IMPLANT
COVER TIP SHEARS 8 DVNC (MISCELLANEOUS) IMPLANT
COVER TIP SHEARS 8MM DA VINCI (MISCELLANEOUS)
COVER WAND RF STERILE (DRAPES) ×3 IMPLANT
DECANTER SPIKE VIAL GLASS SM (MISCELLANEOUS) ×3 IMPLANT
DERMABOND ADVANCED (GAUZE/BANDAGES/DRESSINGS) ×1
DERMABOND ADVANCED .7 DNX12 (GAUZE/BANDAGES/DRESSINGS) ×2 IMPLANT
DRAPE 3/4 80X56 (DRAPES) ×3 IMPLANT
DRAPE ARM DVNC X/XI (DISPOSABLE) ×6 IMPLANT
DRAPE COLUMN DVNC XI (DISPOSABLE) ×2 IMPLANT
DRAPE DA VINCI XI ARM (DISPOSABLE) ×3
DRAPE DA VINCI XI COLUMN (DISPOSABLE) ×1
ELECT REM PT RETURN 15FT ADLT (MISCELLANEOUS) ×3 IMPLANT
ENDOLOOP SUT PDS II  0 18 (SUTURE)
ENDOLOOP SUT PDS II 0 18 (SUTURE) IMPLANT
GAUZE 4X4 16PLY RFD (DISPOSABLE) ×3 IMPLANT
GLOVE BIO SURGEON STRL SZ7.5 (GLOVE) ×6 IMPLANT
GLOVE INDICATOR 8.0 STRL GRN (GLOVE) ×6 IMPLANT
GOWN STRL REUS W/TWL LRG LVL3 (GOWN DISPOSABLE) ×9 IMPLANT
GOWN STRL REUS W/TWL XL LVL3 (GOWN DISPOSABLE) ×6 IMPLANT
MARKER SKIN DUAL TIP RULER LAB (MISCELLANEOUS) ×3 IMPLANT
NEEDLE HYPO 22GX1.5 SAFETY (NEEDLE) ×3 IMPLANT
NEEDLE INSUFFLATION 14GA 120MM (NEEDLE) ×3 IMPLANT
PACK CARDIOVASCULAR III (CUSTOM PROCEDURE TRAY) ×3 IMPLANT
PAD POSITIONING PINK XL (MISCELLANEOUS) ×3 IMPLANT
RELOAD STAPLER 3.5X60 BLU DVNC (STAPLE) ×12 IMPLANT
RELOAD STAPLER 4.3X60 GRN DVNC (STAPLE) ×2 IMPLANT
SCISSORS LAP 5X35 DISP (ENDOMECHANICALS) IMPLANT
SEAL CANN UNIV 5-8 DVNC XI (MISCELLANEOUS) ×4 IMPLANT
SEAL XI 5MM-8MM UNIVERSAL (MISCELLANEOUS) ×2
SEALER VESSEL DA VINCI XI (MISCELLANEOUS) ×1
SEALER VESSEL EXT DVNC XI (MISCELLANEOUS) ×2 IMPLANT
SET IRRIG TUBING LAPAROSCOPIC (IRRIGATION / IRRIGATOR) ×3 IMPLANT
SLEEVE GASTRECTOMY 40FR VISIGI (MISCELLANEOUS) ×3 IMPLANT
SOL ANTI FOG 6CC (MISCELLANEOUS) ×2 IMPLANT
SOLUTION ANTI FOG 6CC (MISCELLANEOUS) ×1
SOLUTION ELECTROLUBE (MISCELLANEOUS) ×3 IMPLANT
STAPLER 60 DA VINCI SURE FORM (STAPLE) ×1
STAPLER 60 SUREFORM DVNC (STAPLE) ×2 IMPLANT
STAPLER CANNULA SEAL DVNC XI (STAPLE) ×2 IMPLANT
STAPLER CANNULA SEAL XI (STAPLE) ×1
STAPLER RELOAD 3.5X60 BLU DVNC (STAPLE) ×12
STAPLER RELOAD 3.5X60 BLUE (STAPLE) ×6
STAPLER RELOAD 4.3X60 GREEN (STAPLE) ×1
STAPLER RELOAD 4.3X60 GRN DVNC (STAPLE) ×2
STRIP CLOSURE SKIN 1/2X4 (GAUZE/BANDAGES/DRESSINGS) ×3 IMPLANT
SUT MNCRL AB 4-0 PS2 18 (SUTURE) IMPLANT
SUT VLOC 180 2-0 9IN GS21 (SUTURE) IMPLANT
SYR 20ML LL LF (SYRINGE) ×3 IMPLANT
TAPE STRIPS DRAPE STRL (GAUZE/BANDAGES/DRESSINGS) ×3 IMPLANT
TOWEL OR 17X26 10 PK STRL BLUE (TOWEL DISPOSABLE) ×3 IMPLANT
TOWEL OR NON WOVEN STRL DISP B (DISPOSABLE) ×3 IMPLANT
TRAY FOLEY MTR SLVR 16FR STAT (SET/KITS/TRAYS/PACK) IMPLANT
TROCAR ADV FIXATION 5X100MM (TROCAR) IMPLANT
TROCAR BLADELESS OPT 5 100 (ENDOMECHANICALS) ×3 IMPLANT
TUBING INSUFFLATION 10FT LAP (TUBING) ×3 IMPLANT

## 2020-09-01 NOTE — Transfer of Care (Signed)
Immediate Anesthesia Transfer of Care Note  Patient: Julia Lynch  Procedure(s) Performed: XI ROBOTIC GASTRIC SLEEVE RESECTION (N/A Abdomen) UPPER GI ENDOSCOPY (N/A )  Patient Location: PACU  Anesthesia Type:General  Level of Consciousness: awake, drowsy and patient cooperative  Airway & Oxygen Therapy: Patient Spontanous Breathing and Patient connected to face mask oxygen  Post-op Assessment: Report given to RN and Post -op Vital signs reviewed and stable  Post vital signs: Reviewed and stable  Last Vitals:  Vitals Value Taken Time  BP 133/64 09/01/20 0930  Temp    Pulse 84 09/01/20 0932  Resp 26 09/01/20 0932  SpO2 100 % 09/01/20 0932  Vitals shown include unvalidated device data.  Last Pain:  Vitals:   09/01/20 0603  TempSrc:   PainSc: 3       Patients Stated Pain Goal: 2 (09/01/20 0603)  Complications: No complications documented.

## 2020-09-01 NOTE — Anesthesia Postprocedure Evaluation (Signed)
Anesthesia Post Note  Patient: Julia Lynch  Procedure(s) Performed: XI ROBOTIC GASTRIC SLEEVE RESECTION (N/A Abdomen) UPPER GI ENDOSCOPY (N/A )     Patient location during evaluation: PACU Anesthesia Type: General Level of consciousness: awake and alert and oriented Pain management: pain level controlled Vital Signs Assessment: post-procedure vital signs reviewed and stable Respiratory status: spontaneous breathing, nonlabored ventilation and respiratory function stable Cardiovascular status: blood pressure returned to baseline and stable Postop Assessment: no apparent nausea or vomiting Anesthetic complications: no   No complications documented.  Last Vitals:  Vitals:   09/01/20 1000 09/01/20 1015  BP: 137/77 (!) 142/73  Pulse: 81 80  Resp: 11 11  Temp:    SpO2: 100% 100%    Last Pain:  Vitals:   09/01/20 1000  TempSrc:   PainSc: 7                  [name]

## 2020-09-01 NOTE — Progress Notes (Signed)
Discussed post op day goals with patient including ambulation, IS, diet progression, pain, and nausea control.  BSTOP education provided including BSTOP information guide, "Guide for Pain Management after your Bariatric Procedure".  Questions answered. 

## 2020-09-01 NOTE — Op Note (Signed)
09/01/2020 [name] NAZANIN KINNER [birth year] [id number]   PRE-OPERATIVE DIAGNOSIS:     Class 3 severe obesity due to excess calories with serious comorbidity and body mass index (BMI) of 53   Arthritis - Right knee, left ankle   Esophageal reflux   Benign essential HTN   Diabetes mellitus 2   Obstructive sleep apnea   Hypercholesterolemia with hypertriglyceridemia  POST-OPERATIVE DIAGNOSIS:  same  PROCEDURE:  Procedure(s): Xi Robotic SLEEVE GASTRECTOMY  UPPER GI ENDOSCOPY  SURGEON:  Surgeon(s): Atilano Ina, MD FACS FASMBS  ASSISTANTS: Phylliss Blakes MD FACS  ANESTHESIA:   general  DRAINS: none   BOUGIE: 40 fr ViSiGi  LOCAL MEDICATIONS USED:   Exparel  EBL:  minimal  SPECIMEN:  Source of Specimen:  Greater curvature of stomach  DISPOSITION OF SPECIMEN:  PATHOLOGY  COUNTS:  YES  INDICATION FOR PROCEDURE: This is a very pleasant 44 y.o.-year-old morbidly obese female who has had unsuccessful attempts for sustained weight loss. The patient presents today for a planned robotic/laparoscopic sleeve gastrectomy with upper endoscopy. We have discussed the risk and benefits of the procedure extensively preoperatively. Please see my separate notes.  PROCEDURE: After obtaining informed consent and receiving 5000 units of subcutaneous heparin, the patient was brought to the operating room at [facility] and placed supine on the operating room table. General endotracheal anesthesia was established. Sequential compression devices were placed. A orogastric tube was placed. The patient's abdomen was prepped and draped in the usual standard surgical fashion. The patient received preoperative IV antibiotic. A surgical timeout was performed. ERAS protocol used.   Access to the abdomen was achieved using a 5 mm 0 laparoscope thru a 5 mm trocar In the mid clavicular line in the left midabdomen about 3 cm to the left & slight above the level of the umbilicus using the Optiview technique.  Pneumoperitoneum was smoothly established up to 15 mm of mercury. The laparoscope was advanced and the abdominal cavity was surveilled. The patient was then placed in reverse Trendelenburg.  A tap block was performed on patient's right side under direct visualization.  A 12 mm robotic trocar was placed in the mid right abdomen.  The patient was rotated to the right.  The optical entry trocar in the left midabdomen was changed to a 8 mm robotic trocar under direct visualization.  [facility] liver retractor was placed under the left lobe of the liver through a 5 mm trocar incision site in the subxiphoid position. A final 5 mm trocar was placed in the lateral LUQ.  A tap block was performed along the patient's left side also under direct laparoscopic visualization.    The XI robot was then docked and targeted for upper abdominal anatomy.  Arm 3  was attached to the left mid abdominal trocar and the camera was inserted.  Anatomy was targeted.  Arms 2 and 4 were then attached to the robotic trochars.  A fenestrated bipolar was placed in arm 2.  A vessel sealer was placed in arm 4.  My assistant stayed at the bedside while I went to the robotic console.  The stomach was inspected. It was completely decompressed and the orogastric tube was removed.  There was no anterior dimple that was obviously visible.  Her preop upper GI showed no hiatal hernia.    We identified the pylorus and measured 6 cm proximal to the pylorus and identified an area of where we would start taking down the short gastric vessels.  The vessel sealer  was used to take down the short gastric vessels along the greater curvature of the stomach. We were able to enter the lesser sac. We continued to march along the greater curvature of the stomach taking down the short gastrics.  My assistant provided traction laterally to patient's left side.  as we approached the gastrosplenic ligament we took care in this area not to injure the spleen. We were  able to take down the entire gastrosplenic ligament. We then mobilized the fundus away from the left crus of diaphragm. There were not any significant posterior gastric avascular attachments. This left the stomach completely mobilized. No vessels had been taken down along the lesser curvature of the stomach.  We then reidentified the pylorus. A 40Fr ViSiGi was then placed in the oropharynx and advanced down into the stomach and placed in the distal antrum and positioned along the lesser curvature. It was placed under suction which secured the 40Fr ViSiGi in place along the lesser curve. Then using the robotic 60 mm stapler with a green load, I placed a stapler along the antrum approximately 6cm from the pylorus. The stapler was angled so that there is ample room at the angularis incisura. I then fired the first staple load after inspecting it posteriorly to ensure adequate space both anteriorly and posteriorly.  The stapler did not have to pause for compression so at this point I started using 60 mm blue load staple cartridges. The robotic stapler was then repositioned with a 60 mm blue load  and we continued to march up along the ViSiGi. My assistant was holding traction along the greater curvature stomach along the cauterized short gastric vessels ensuring that the stomach was symmetrically retracted. Prior to each firing of the staple, we rotated the stomach to ensure that there is adequate stomach left.  As we approached the fundus, I used 60 mm blue cartridge aiming  lateral to the GE junction after mobilizing some of the esophageal fat pad.  The sleeve was inspected. There is no evidence of cork screw. The staple line appeared hemostatic except along the first portion of the staple line at the antrum.  The assistant placed 3 hemoclips at this point there was excellent hemostasis. The CRNA inflated the ViSiGi to the green zone and the upper abdomen was flooded with saline. There were no bubbles. The sleeve  was decompressed and the ViSiGi removed.I performed an upper endoscopy.  The endoscope was placed in the patient's oropharynx and gently glided down.  Z-line at approximately 38 cm.  The endoscope was advanced into the gastric sleeve and insufflated.  I was able to easily advance the endoscope down into the antrum.  Pylorus was visible.  There is no significant stricture at the incisura.  There was no corkscrewing.  There is no evidence of internal bleeding.  The sleeve was decompressed and the endoscope was removed.   The greater curvature the stomach was grasped with a laparoscopic grasper.  At this point I scrubbed back in.  The robot was undocked.  We removed the specimen from the 12 mm trocar site.  The liver retractor was removed. I then closed the 12 mm trocar site with 1 interrupted 0 Vicryl sutures through the fascia using the endoclose. The closure was viewed laparoscopically and it was airtight. Remaining Exparel was then infiltrated in the preperitoneal spaces around the trocar sites. Pneumoperitoneum was released. All trocar sites were closed with a 4-0 Monocryl in a subcuticular fashion followed by the application of benzoin,  and bandaids. The patient was extubated and taken to the recovery room in stable condition. All needle, instrument, and sponge counts were correct x2. There are no immediate complications  (1) 60 mm green (6) 60 mm blue  PLAN OF CARE: Admit to inpatient   PATIENT DISPOSITION:  PACU - hemodynamically stable.   Delay start of Pharmacological VTE agent (>24hrs) due to surgical blood loss or risk of bleeding:  no  Mary Sella. Andrey Campanile, MD, FACS FASMBS General, Bariatric, & Minimally Invasive Surgery [facility], [place]

## 2020-09-01 NOTE — Discharge Instructions (Signed)
° ° ° °GASTRIC BYPASS/SLEEVE ° Home Care Instructions ° ° These instructions are to help you care for yourself when you go home. ° °Call: If you have any problems. °• Call [phone] and ask for the surgeon on call °• If you need immediate help, come to the ER at [place].  °• Tell the ER staff that you are a new post-op gastric bypass or gastric sleeve patient °  °Signs and symptoms to report: • Severe vomiting or nausea °o If you cannot keep down clear liquids for longer than 1 day, call your surgeon  °• Abdominal pain that does not get better after taking your pain medication °• Fever over 100.4° F with chills °• Heart beating over 100 beats a minute °• Shortness of breath at rest °• Chest pain °•  Redness, swelling, drainage, or foul odor at incision (surgical) sites °•  If your incisions open or pull apart °• Swelling or pain in calf (lower leg) °• Diarrhea (Loose bowel movements that happen often), frequent watery, uncontrolled bowel movements °• Constipation, (no bowel movements for 3 days) if this happens: Pick one °o Milk of Magnesia, 2 tablespoons by mouth, 3 times a day for 2 days if needed °o Stop taking Milk of Magnesia once you have a bowel movement °o Call your doctor if constipation continues °Or °o Miralax  (instead of Milk of Magnesia) following the label instructions °o Stop taking Miralax once you have a bowel movement °o Call your doctor if constipation continues °• Anything you think is not normal °  °Normal side effects after surgery: • Unable to sleep at night or unable to focus °• Irritability or moody °• Being tearful (crying) or depressed °These are common complaints, possibly related to your anesthesia medications that put you to sleep, stress of surgery, and change in lifestyle.  This usually goes away a few weeks after surgery.  If these feelings continue, call your primary care doctor. °  °Wound Care: You may have surgical glue, steri-strips, or staples over your incisions after  surgery °• Surgical glue:  Looks like a clear film over your incisions and will wear off a little at a time °• Steri-strips: Strips of tape over your incisions. You may notice a yellowish color on the skin under the steri-strips. This is used to make the   steri-strips stick better. Do not pull the steri-strips off - let them fall off °• Staples: Staples may be removed before you leave the hospital °o If you go home with staples, call [facility], [phone] at for an appointment with your surgeon’s nurse to have staples removed 10 days after surgery. °• Showering: You may shower two (2) days after your surgery unless your surgeon tells you differently °o Wash gently around incisions with warm soapy water, rinse well, and gently pat dry  °o No tub baths until staples are removed, steri-strips fall off or glue is gone.  °  °Medications: • Medications should be liquid or crushed if larger than the size of a dime °• Extended release pills (medication that release a little bit at a time through the day) should NOT be crushed or cut. (examples include XL, ER, DR, SR) °• Depending on the size and number of medications you take, you may need to space (take a few throughout the day)/change the time you take your medications so that you do not over-fill your pouch (smaller stomach) °• Make sure you follow-up with your primary care doctor to   make medication changes needed during rapid weight loss and life-style changes °• If you have diabetes, follow up with the doctor that orders your diabetes medication(s) within one week after surgery and check your blood sugar regularly. °• Do not drive while taking prescription pain medication  °• It is ok to take Tylenol by the bottle instructions with your pain medicine or instead of your pain medicine as needed.  DO NOT TAKE NSAIDS (EXAMPLES OF NSAIDS:  IBUPROFREN/ NAPROXEN)  °Diet:                    First 2 Weeks ° You will see the dietician t about two (2) weeks  after your surgery. The dietician will increase the types of foods you can eat if you are handling liquids well: °• If you have severe vomiting or nausea and cannot keep down clear liquids lasting longer than 1 day, call your surgeon @ ([phone]) °Protein Shake °• Drink at least 2 ounces of shake 5-6 times per day °• Each serving of protein shakes (usually 8 - 12 ounces) should have: °o 15 grams of protein  °o And no more than 5 grams of carbohydrate  °• Goal for protein each day: °o Men = 80 grams per day °o Women = 60 grams per day °• Protein powder may be added to fluids such as non-fat milk or Lactaid milk or unsweetened Soy/Almond milk (limit to 35 grams added protein powder per serving) ° °Hydration °• Slowly increase the amount of water and other clear liquids as tolerated (See Acceptable Fluids) °• Slowly increase the amount of protein shake as tolerated  °•  Sip fluids slowly and throughout the day.  Do not use straws. °• May use sugar substitutes in small amounts (no more than 6 - 8 packets per day; i.e. Splenda) ° °Fluid Goal °• The first goal is to drink at least 8 ounces of protein shake/drink per day (or as directed by the nutritionist); some examples of protein shakes are Syntrax Nectar, Adkins Advantage, EAS Edge HP, and Unjury. See handout from pre-op Bariatric Education Class: °o Slowly increase the amount of protein shake you drink as tolerated °o You may find it easier to slowly sip shakes throughout the day °o It is important to get your proteins in first °• Your fluid goal is to drink 64 - 100 ounces of fluid daily °o It may take a few weeks to build up to this °• 32 oz (or more) should be clear liquids  °And  °• 32 oz (or more) should be full liquids (see below for examples) °• Liquids should not contain sugar, caffeine, or carbonation ° °Clear Liquids: °• Water or Sugar-free flavored water (i.e. Fruit H2O, Propel) °• Decaffeinated coffee or tea (sugar-free) °• Crystal Lite, Wyler’s Lite,  Minute Maid Lite °• Sugar-free Jell-O °• Bouillon or broth °• Sugar-free Popsicle:   *Less than 20 calories each; Limit 1 per day ° °Full Liquids: °Protein Shakes/Drinks + 2 choices per day of other full liquids °• Full liquids must be: °o No More Than 15 grams of Carbs per serving  °o No More Than 3 grams of Fat per serving °• Strained low-fat cream soup (except Cream of Potato or Tomato) °• Non-Fat milk °• Fat-free Lactaid Milk °• Unsweetened Soy Or Unsweetened Almond Milk °• Low Sugar yogurt (Dannon Lite & Fit, Greek yogurt; Oikos Triple Zero; Chobani Simply 100; Yoplait 100 calorie Greek - No Fruit on the Bottom) ° °  °Vitamins   and Minerals • Start 1 day after surgery unless otherwise directed by your surgeon °• Chewable Bariatric Specific Multivitamin / Multimineral Supplement with iron (Example: Bariatric Advantage Multi EA) °• Chewable Calcium with Vitamin D-3 °(Example: 3 Chewable Calcium Plus 600 with Vitamin D-3) °o Take 500 mg three (3) times a day for a total of 1500 mg each day °o Do not take all 3 doses of calcium at one time as it may cause constipation, and you can only absorb 500 mg  at a time  °o Do not mix multivitamins containing iron with calcium supplements; take 2 hours apart °• Menstruating women and those with a history of anemia (a blood disease that causes weakness) may need extra iron °o Talk with your doctor to see if you need more iron °• Do not stop taking or change any vitamins or minerals until you talk to your dietitian or surgeon °• Your Dietitian and/or surgeon must approve all vitamin and mineral supplements °  °Activity and Exercise: Limit your physical activity as instructed by your doctor.  It is important to continue walking at home.  During this time, use these guidelines: °• Do not lift anything greater than ten (10) pounds for at least two (2) weeks °• Do not go back to work or drive until your surgeon says you can °• You may have sex when you feel comfortable  °o It is  VERY important for female patients to use a reliable birth control method; fertility often increases after surgery  °o All hormonal birth control will be ineffective for 30 days after surgery due to medications given during surgery a barrier method must be used. °o Do not get pregnant for at least 18 months °• Start exercising as soon as your doctor tells you that you can °o Make sure your doctor approves any physical activity °• Start with a simple walking program °• Walk 5-15 minutes each day, 7 days per week.  °• Slowly increase until you are walking 30-45 minutes per day °Consider joining our BELT program. [phone] or email [email or web address] °  °Special Instructions Things to remember: °• Use your CPAP when sleeping if this applies to you ° °• [facility] has two free Bariatric Surgery Support Groups that meet monthly °o The 3rd Thursday of each month, 6 pm °• It is very important to keep all follow up appointments with your surgeon, dietitian, primary care physician, and behavioral health practitioner °• Routine follow up schedule with your surgeon include appointments at 2-3 weeks, 6-8 weeks, 6 months, and 1 year at a minimum.  Your surgeon may request to see you more often.   °• After the first year, please follow up with your bariatric surgeon and dietitian at least once a year in order to maintain best weight loss results ° ° °[facility]: [phone] °[facility]: [phone] °Bariatric Nurse Coordinator: [phone] °  °   Reviewed and Endorsed  °by [facility] Patient Education Committee, June, 2016 °Edits Approved: Aug, 2018 ° ° ° °

## 2020-09-01 NOTE — Anesthesia Procedure Notes (Signed)
Procedure Name: Intubation Date/Time: 09/01/2020 7:41 AM Performed by: West Pugh, CRNA Pre-anesthesia Checklist: Patient identified, Emergency Drugs available, Suction available, Patient being monitored and Timeout performed Patient Re-evaluated:Patient Re-evaluated prior to induction Oxygen Delivery Method: Circle system utilized Preoxygenation: Pre-oxygenation with 100% oxygen Induction Type: IV induction, Cricoid Pressure applied and Rapid sequence Laryngoscope Size: Mac and 3 Grade View: Grade I Tube type: Oral Tube size: 7.0 mm Number of attempts: 1 Airway Equipment and Method: Stylet Placement Confirmation: ETT inserted through vocal cords under direct vision,  positive ETCO2 and breath sounds checked- equal and bilateral Secured at: 21 cm Tube secured with: Tape Dental Injury: Teeth and Oropharynx as per pre-operative assessment

## 2020-09-01 NOTE — Interval H&P Note (Signed)
History and Physical Interval Note:  09/01/2020 7:10 AM  Julia Lynch  has presented today for surgery, with the diagnosis of MORBID OBESITY.  The various methods of treatment have been discussed with the patient and family. After consideration of risks, benefits and other options for treatment, the patient has consented to  Procedure(s): XI ROBOTIC GASTRIC SLEEVE RESECTION (N/A) UPPER GI ENDOSCOPY (N/A) as a surgical intervention.  The patient's history has been reviewed, patient examined, no change in status, stable for surgery.  I have reviewed the patient's chart and labs.  Questions were answered to the patient's satisfaction.    Mary Sella. Andrey Campanile, MD, FACS General, Bariatric, & Minimally Invasive Surgery [facility] [place]  Gaynelle Adu

## 2020-09-01 NOTE — Progress Notes (Signed)
PHARMACY CONSULT FOR:  Risk Assessment for Post-Discharge VTE Following Bariatric Surgery  Post-Discharge VTE Risk Assessment: This patient's probability of 30-day post-discharge VTE is increased due to the factors marked:   Female    Age >/=60 years   X BMI >/=50 kg/m2    CHF    Dyspnea at Rest    Paraplegia   X Non-gastric-band surgery    Operation Time >/=3 hr    Return to OR     Length of Stay >/= 3 d   Hx of VTE   Hypercoagulable condition   Significant venous stasis       Predicted probability of 30-day post-discharge VTE: 0.27%  Other patient-specific factors to consider: N/A   Recommendation for Discharge: No pharmacologic prophylaxis post-discharge      Julia Lynch is a 44 y.o. female who underwent robotic sleeve gastrectomy on 09/01/2020.    Case start: 0800 Case end: 0918   Allergies  Allergen Reactions  . Ibuprofen     Other reaction(s): Other (See Comments) GI Upset  . Lodine [Etodolac] Hives and Other (See Comments)    GI upset    Patient Measurements: Height: 5\' 1"  (154.9 cm) Weight: 128.6 kg (283 lb 9.6 oz) IBW/kg (Calculated) : 47.8 Body mass index is 53.59 kg/m.  Recent Labs    09/01/20 0952  HGB 13.6  HCT 40.8   Estimated Creatinine Clearance: 113.5 mL/min (by C-G formula based on SCr of 0.67 mg/dL).    Past Medical History:  Diagnosis Date  . Anxiety   . Arthritis   . Asthma 2006   h/o   . Depression   . Diabetic acidosis, type II (HCC)   . GERD (gastroesophageal reflux disease)   . Headache   . History of chicken pox   . History of kidney stones    h/o  . Hypertension   . PCOS (polycystic ovarian syndrome)   . Sleep apnea    uses cpap  . Vitamin D deficiency disease      Medications Prior to Admission  Medication Sig Dispense Refill Last Dose  . acetaminophen (TYLENOL 8 HOUR) 650 MG CR tablet Take 1 tablet (650 mg total) by mouth every 8 (eight) hours as needed for pain. 30 tablet 0 08/31/2020 at  Unknown time  . atorvastatin (LIPITOR) 20 MG tablet Take 1 tablet (20 mg total) by mouth daily. 90 tablet 3 08/31/2020 at Unknown time  . Biotin 09/02/2020 MCG TABS Take 10,000 mg by mouth daily.    08/31/2020 at Unknown time  . Cholecalciferol (VITAMIN D) 50 MCG (2000 UT) CAPS Take 2,000 Units by mouth daily.   08/31/2020 at Unknown time  . citalopram (CELEXA) 40 MG tablet TAKE 1 TABLET BY MOUTH EVERY DAY (Patient taking differently: Take 20 mg by mouth daily. ) 30 tablet 11 09/01/2020 at 0455  . cyclobenzaprine (FLEXERIL) 5 MG tablet Take 1 tablet (5 mg total) by mouth 3 (three) times daily as needed for muscle spasms. (Patient taking differently: Take 5 mg by mouth 3 (three) times daily as needed (Headache). ) 30 tablet 1 Past Month at Unknown time  . diazepam (VALIUM) 5 MG tablet TAKE AS NEEDED FOR HEADACHES, NO MORE THAN 3 IN A DAY (Patient taking differently: Take 5 mg by mouth daily as needed (Headache). ) 30 tablet 5 Past Month at Unknown time  . lisinopril-hydrochlorothiazide (ZESTORETIC) 20-25 MG tablet Take 1 tablet by mouth daily. 90 tablet 3 08/31/2020 at Unknown time  . Multiple Vitamin (MULTIVITAMIN ADULT  PO) Take 1 tablet by mouth daily.    08/31/2020 at Unknown time  . omeprazole (PRILOSEC) 20 MG capsule TAKE 1 CAPSULE BY MOUTH EVERY DAY**NEEDS REFILLS* (Patient taking differently: Take 20 mg by mouth daily. ) 90 capsule 2 09/01/2020 at 0455  . Semaglutide,0.25 or 0.5MG /DOS, (OZEMPIC, 0.25 OR 0.5 MG/DOSE,) 2 MG/1.5ML SOPN Start with 0.25mg  SQ once weekly x 4 weeks, then increase to 0.5mg  SQ weekly (Patient taking differently: Inject 0.5 mg into the skin every 7 (seven) days. ) 1.5 mL 2 08/24/2020  . cephALEXin (KEFLEX) 500 MG capsule Take 1 capsule (500 mg total) by mouth 2 (two) times daily. (Patient not taking: Reported on 08/21/2020) 14 capsule 0 Not Taking at Unknown time  . diphenoxylate-atropine (LOMOTIL) 2.5-0.025 MG tablet Take 1 tablet by mouth 4 (four) times daily. (Patient taking  differently: Take 1 tablet by mouth daily as needed for diarrhea or loose stools. ) 120 tablet 1 More than a month at Unknown time  . Insulin Pen Needle (BD PEN NEEDLE NANO 2ND GEN) 32G X 4 MM MISC For Ozempic injections 100 each 0 supply  . magnesium oxide (MAG-OX) 400 MG tablet Take 400 mg by mouth 2 (two) times daily. (Patient not taking: Reported on 08/21/2020)   More than a month at Unknown time  . valACYclovir (VALTREX) 500 MG tablet Take 1 tablet (500 mg total) by mouth daily. Can increase to twice a day for 5 days in the event of a recurrence (Patient taking differently: Take 500 mg by mouth daily as needed (flair up). Can increase to twice a day for 5 days in the event of a recurrence) 30 tablet 12 More than a month at Unknown time       Jamse Mead 09/01/2020,2:01 PM

## 2020-09-01 NOTE — Progress Notes (Signed)
Pt. set up with CPAP for h/s use, has own hose/nasal mask currently on room air 95% sats, made aware to notify if needed.

## 2020-09-02 ENCOUNTER — Encounter (HOSPITAL_COMMUNITY): Payer: Self-pay | Admitting: General Surgery

## 2020-09-02 ENCOUNTER — Other Ambulatory Visit (HOSPITAL_COMMUNITY): Payer: Self-pay | Admitting: General Surgery

## 2020-09-02 LAB — CBC WITH DIFFERENTIAL/PLATELET
Abs Immature Granulocytes: 0.07 10*3/uL (ref 0.00–0.07)
Basophils Absolute: 0 10*3/uL (ref 0.0–0.1)
Basophils Relative: 0 %
Eosinophils Absolute: 0 10*3/uL (ref 0.0–0.5)
Eosinophils Relative: 0 %
HCT: 39.3 % (ref 36.0–46.0)
Hemoglobin: 13 g/dL (ref 12.0–15.0)
Immature Granulocytes: 1 %
Lymphocytes Relative: 15 %
Lymphs Abs: 1.8 10*3/uL (ref 0.7–4.0)
MCH: 28.3 pg (ref 26.0–34.0)
MCHC: 33.1 g/dL (ref 30.0–36.0)
MCV: 85.4 fL (ref 80.0–100.0)
Monocytes Absolute: 0.7 10*3/uL (ref 0.1–1.0)
Monocytes Relative: 5 %
Neutro Abs: 9.7 10*3/uL — ABNORMAL HIGH (ref 1.7–7.7)
Neutrophils Relative %: 79 %
Platelets: 259 10*3/uL (ref 150–400)
RBC: 4.6 MIL/uL (ref 3.87–5.11)
RDW: 13.3 % (ref 11.5–15.5)
WBC: 12.3 10*3/uL — ABNORMAL HIGH (ref 4.0–10.5)
nRBC: 0 % (ref 0.0–0.2)

## 2020-09-02 LAB — COMPREHENSIVE METABOLIC PANEL
ALT: 58 U/L — ABNORMAL HIGH (ref 0–44)
AST: 37 U/L (ref 15–41)
Albumin: 3.6 g/dL (ref 3.5–5.0)
Alkaline Phosphatase: 60 U/L (ref 38–126)
Anion gap: 8 (ref 5–15)
BUN: 10 mg/dL (ref 6–20)
CO2: 25 mmol/L (ref 22–32)
Calcium: 8.5 mg/dL — ABNORMAL LOW (ref 8.9–10.3)
Chloride: 104 mmol/L (ref 98–111)
Creatinine, Ser: 0.67 mg/dL (ref 0.44–1.00)
GFR, Estimated: >60 mL/min (ref >60)
Glucose, Bld: 177 mg/dL — ABNORMAL HIGH (ref 70–99)
Potassium: 4.4 mmol/L (ref 3.5–5.1)
Sodium: 137 mmol/L (ref 135–145)
Total Bilirubin: 0.6 mg/dL (ref 0.3–1.2)
Total Protein: 6.8 g/dL (ref 6.5–8.1)

## 2020-09-02 LAB — GLUCOSE, CAPILLARY
Glucose-Capillary: 148 mg/dL — ABNORMAL HIGH (ref 70–99)
Glucose-Capillary: 150 mg/dL — ABNORMAL HIGH (ref 70–99)
Glucose-Capillary: 170 mg/dL — ABNORMAL HIGH (ref 70–99)

## 2020-09-02 LAB — SURGICAL PATHOLOGY

## 2020-09-02 MED ORDER — PANTOPRAZOLE SODIUM 40 MG PO TBEC: 40.0000 mg | DELAYED_RELEASE_TABLET | Freq: Every day | ORAL | 0 refills | Status: DC

## 2020-09-02 MED ORDER — ONDANSETRON 4 MG PO TBDP: 4.0000 mg | ORAL_TABLET | Freq: Four times a day (QID) | ORAL | 0 refills | Status: DC | PRN

## 2020-09-02 MED ORDER — TRAMADOL HCL 50 MG PO TABS: 50.0000 mg | ORAL_TABLET | Freq: Four times a day (QID) | ORAL | 0 refills | Status: DC | PRN

## 2020-09-02 MED ORDER — ACETAMINOPHEN 500 MG PO TABS: 1000.0000 mg | ORAL_TABLET | Freq: Three times a day (TID) | ORAL | 0 refills | Status: AC

## 2020-09-02 MED FILL — traMADol HCL 50 MG TABS: 50 | 2 days supply | Qty: 10 | Fill #0

## 2020-09-02 MED FILL — ONDANSETRON ODT 4 MG TABLET: 4 | 7 days supply | Qty: 20 | Fill #0

## 2020-09-02 MED FILL — PANTOPRAZOLE SOD DR 40 MG T: 40 | 30 days supply | Qty: 30 | Fill #0

## 2020-09-02 NOTE — Progress Notes (Signed)
Nutrition Brief Note  RD consulted for diet education for patient s/p bariatric surgery. Bariatric nurse coordinator providing education.   If nutrition issues arise, please consult RD.   Mylynn Dinh, MS, RD, LDN Inpatient Clinical Dietitian Contact information available via Amion  

## 2020-09-02 NOTE — Progress Notes (Signed)
Patient alert and oriented, pain is controlled. Patient is tolerating fluids, advanced to protein shake today, patient is tolerating well.  Reviewed Gastric sleeve discharge instructions with patient and patient is able to articulate understanding.  Provided information on BELT program, Support Group and [facility] outpatient pharmacy. All questions answered, will continue to monitor.  

## 2020-09-02 NOTE — Discharge Summary (Signed)
Physician Discharge Summary  Julia Lynch:[id number] DOB: [birth year] DOA: 09/01/2020  PCP: Mar Daring, PA-C  Admit date: 09/01/2020 Discharge date: 09/02/2020  Recommendations for Outpatient Follow-up:    Follow-up Information    [name], MD. Go on 09/24/2020.   Specialty: General Surgery Why: at 1015 am.  Please arrive 15 minutes prior to appointment.  Thank you Contact information: [address] [place] [address] [phone]        [name], PA-C. Go on 10/28/2020.   Specialty: General Surgery Why: at 130 pm Contact information: [place] [address] [phone]              Discharge Diagnoses:  Principal Problem:   Class 3 severe obesity due to excess calories with serious comorbidity and body mass index (BMI) of 50.0 to 59.9 in adult [facility]) Active Problems:   Arthritis   Esophageal reflux   Benign essential HTN   Diabetes mellitus ([place])   Obstructive sleep apnea   Hypercholesterolemia with hypertriglyceridemia   S/P laparoscopic/robotic sleeve gastrectomy   Surgical Procedure: Laparoscopic/robotic Sleeve Gastrectomy, upper endoscopy  Discharge Condition: Good Disposition: Home  Diet recommendation: Postoperative sleeve gastrectomy diet (liquids only)  Filed Weights   09/01/20 0543 09/01/20 0554  Weight: 127.9 kg 128.6 kg     Hospital Course:  The patient was admitted for a planned laparoscopic/robotic sleeve gastrectomy. Please see operative note. Preoperatively the patient was given 5000 units of subcutaneous heparin for DVT prophylaxis. Postoperative prophylactic Lovenox dosing was started on the evening of postoperative day 0. ERAS protocol was used. On the evening of postoperative day 0, the patient was started on water and ice chips. On postoperative day 1 the patient had no fever or tachycardia and was tolerating water in their diet was gradually advanced throughout the day. The  patient was ambulating without difficulty. Their vital signs are stable without fever or tachycardia. Their hemoglobin had remained stable. The patient was maintained on their home settings for CPAP therapy. The patient had received discharge instructions and counseling. They were deemed stable for discharge and had met discharge criteria  BP (!) 156/91 (BP Location: Right Arm)   Pulse 73   Temp 98.6 F (37 C)   Resp 17   Ht _0  (1.549 m)   Wt 128.6 kg   SpO2 99%   BMI 53.59 kg/m   Gen: alert, NAD, non-toxic appearing Pupils: equal, no scleral icterus Pulm: Lungs clear to auscultation, symmetric chest rise CV: regular rate and rhythm Abd: soft, mild approp tender, nondistended. No cellulitis. No incisional hernia Ext: no edema, no calf tenderness Skin: no rash, no jaundice   Discharge Instructions  Discharge Instructions    Ambulate hourly while awake   Complete by: As directed    Call MD for:  difficulty breathing, headache or visual disturbances   Complete by: As directed    Call MD for:  persistant dizziness or light-headedness   Complete by: As directed    Call MD for:  persistant nausea and vomiting   Complete by: As directed    Call MD for:  redness, tenderness, or signs of infection (pain, swelling, redness, odor or green/yellow discharge around incision site)   Complete by: As directed    Call MD for:  severe uncontrolled pain   Complete by: As directed    Call MD for:  temperature >101 F   Complete by: As directed    Diet bariatric full  liquid   Complete by: As directed    Discharge instructions   Complete by: As directed    See bariatric discharge instructions   Incentive spirometry   Complete by: As directed    Perform hourly while awake     Allergies as of 09/02/2020      Reactions   Ibuprofen    Other reaction(s): Other (See Comments) GI Upset   Lodine [etodolac] Hives, Other (See Comments)   GI upset      Medication List    STOP taking these  medications   acetaminophen 650 MG CR tablet Commonly known as: Tylenol 8 Hour Replaced by: acetaminophen 500 MG tablet   BD Pen Needle Nano 2nd Gen 32G X 4 MM Misc Generic drug: Insulin Pen Needle   Biotin 10000 MCG Tabs   cephALEXin 500 MG capsule Commonly known as: KEFLEX   diphenoxylate-atropine 2.5-0.025 MG tablet Commonly known as: Lomotil   magnesium oxide 400 MG tablet Commonly known as: MAG-OX   omeprazole 20 MG capsule Commonly known as: PRILOSEC   Ozempic (0.25 or 0.5 MG/DOSE) 2 MG/1.5ML Sopn Generic drug: Semaglutide(0.25 or 0.5MG/DOS)     TAKE these medications   acetaminophen 500 MG tablet Commonly known as: TYLENOL Take 2 tablets (1,000 mg total) by mouth every 8 (eight) hours for 5 days. Replaces: acetaminophen 650 MG CR tablet   atorvastatin 20 MG tablet Commonly known as: LIPITOR Take 1 tablet (20 mg total) by mouth daily.   citalopram 40 MG tablet Commonly known as: CELEXA TAKE 1 TABLET BY MOUTH EVERY DAY What changed: how much to take   cyclobenzaprine 5 MG tablet Commonly known as: FLEXERIL Take 1 tablet (5 mg total) by mouth 3 (three) times daily as needed for muscle spasms. What changed: reasons to take this   diazepam 5 MG tablet Commonly known as: VALIUM TAKE AS NEEDED FOR HEADACHES, NO MORE THAN 3 IN A DAY What changed: See the new instructions.   lisinopril-hydrochlorothiazide 20-25 MG tablet Commonly known as: ZESTORETIC Take 1 tablet by mouth daily. Notes to patient: Monitor Blood Pressure Daily and keep a log for primary care physician.  Monitor for symptoms of dehydration.  You may need to make changes to your medications with rapid weight loss.     MULTIVITAMIN ADULT PO Take 1 tablet by mouth daily.   ondansetron 4 MG disintegrating tablet Commonly known as: ZOFRAN-ODT Take 1 tablet (4 mg total) by mouth every 6 (six) hours as needed for nausea or vomiting.   pantoprazole 40 MG tablet Commonly known as: PROTONIX Take 1  tablet (40 mg total) by mouth daily.   traMADol 50 MG tablet Commonly known as: ULTRAM Take 1 tablet (50 mg total) by mouth every 6 (six) hours as needed (pain).   valACYclovir 500 MG tablet Commonly known as: VALTREX Take 1 tablet (500 mg total) by mouth daily. Can increase to twice a day for 5 days in the event of a recurrence What changed:   when to take this  reasons to take this   Vitamin D 50 MCG (2000 UT) Caps Take 2,000 Units by mouth daily.       Follow-up Information    [name], MD. Go on 09/24/2020.   Specialty: General Surgery Why: at 1015 am.  Please arrive 15 minutes prior to appointment.  Thank you Contact information: [address] [place] [address] [phone]        [name], PA-C. Go on 10/28/2020.  Specialty: General Surgery Why: at 130 pm Contact information: [place] [address] [phone]                The results of significant diagnostics from this hospitalization (including imaging, microbiology, ancillary and laboratory) are listed below for reference.    Significant Diagnostic Studies: No results found.  Labs: Basic Metabolic Panel: Recent Labs  Lab 08/27/20 1013 09/02/20 0504  NA 136 137  K 4.1 4.4  CL 101 104  CO2 27 25  GLUCOSE 164* 177*  BUN 17 10  CREATININE 0.67 0.67  CALCIUM 9.1 8.5*   Liver Function Tests: Recent Labs  Lab 08/27/20 1013 09/02/20 0504  AST 45* 37  ALT 63* 58*  ALKPHOS 81 60  BILITOT 0.6 0.6  PROT 7.1 6.8  ALBUMIN 3.9 3.6    CBC: Recent Labs  Lab 08/27/20 1013 09/01/20 0952 09/02/20 0504  WBC 8.0  --  12.3*  NEUTROABS 4.6  --  9.7*  HGB 14.5 13.6 13.0  HCT 42.3 40.8 39.3  MCV 83.4  --  85.4  PLT 293  --  259    CBG: Recent Labs  Lab 09/01/20 1549 09/01/20 2024 09/02/20 0018 09/02/20 0410 09/02/20 0755  GLUCAP 217* 195* 170* 148* 150*    Principal Problem:   Class 3 severe obesity due to excess calories with  serious comorbidity and body mass index (BMI) of 50.0 to 59.9 in adult [facility]) Active Problems:   Arthritis   Esophageal reflux   Benign essential HTN   Diabetes mellitus ([place])   Obstructive sleep apnea   Hypercholesterolemia with hypertriglyceridemia   S/P laparoscopic sleeve gastrectomy   Time coordinating discharge: 15 min  Signed:  Gayland Curry, MD [facility], [place] [phone] 09/02/2020, 10:14 AM

## 2020-09-02 NOTE — Progress Notes (Signed)
Patient alert and oriented, Post op day 1.  Provided support and encouragement.  Encouraged pulmonary toilet, ambulation and small sips of liquids.  Completed 12 ounces of bari clear fluid and 6 ounces of protein.  All questions answered.  Will continue to monitor.

## 2020-09-08 ENCOUNTER — Telehealth (HOSPITAL_COMMUNITY): Payer: Self-pay

## 2020-09-08 NOTE — Telephone Encounter (Signed)
Patient called to discuss post bariatric surgery follow up questions.  See below:   1.  Tell me about your pain and pain management?on right side, feels almost normal.  Last tylenol Saturday  2.  Let's talk about fluid intake.  How much total fluid are you taking in?54 ounces  3.  How much protein have you taken in the last 2 days?60 grams of protein  4.  Have you had nausea?  Tell me about when have experienced nausea and what you did to help?denies  5.  Has the frequency or color changed with your urine?urine clear no problems  6.  Tell me what your incisions look like?no problems  7.  Have you been passing gas? BM?BM's without difficulty  8.  If a problem or question were to arise who would you call?  Do you know contact numbers for [facility], [facility], and [facility]?aware of how to contact all services  9.  How has the walking going?walking regularly  10.  How are your vitamins and calcium going?  How are you taking them?mvi and calcium

## 2020-09-14 ENCOUNTER — Other Ambulatory Visit: Payer: Self-pay | Admitting: Physician Assistant

## 2020-09-14 NOTE — Telephone Encounter (Signed)
Requested Prescriptions  Pending Prescriptions Disp Refills  . citalopram (CELEXA) 40 MG tablet [Pharmacy Med Name: CITALOPRAM HBR 40 MG TABLET] 90 tablet 1    Sig: TAKE 1 TABLET BY MOUTH EVERY DAY     Psychiatry:  Antidepressants - SSRI Passed - 09/14/2020  9:23 AM      Passed - Completed PHQ-2 or PHQ-9 in the last 360 days      Passed - Valid encounter within last 6 months    Recent Outpatient Visits          1 month ago Lymphadenopathy   [facility] [place], Alessandra Bevels, PA-C   2 months ago Encounter for annual physical exam   [facility] Joycelyn [place], [place]   8 months ago Irritable bowel syndrome with diarrhea   [facility] [place], [place], [place]   1 year ago Benign essential HTN   [facility] [place], [place], [place]   1 year ago Type 2 diabetes mellitus with hyperglycemia, without long-term current use of insulin [facility])   [facility] [place], Alessandra Bevels, [place]      Future Appointments            In 5 days [place], Alessandra Bevels, PA-C [facility]   In 8 months [place], Vanessa Troy, CNM [facility]

## 2020-09-16 ENCOUNTER — Other Ambulatory Visit: Payer: Self-pay

## 2020-09-16 ENCOUNTER — Encounter: Payer: BC Managed Care – PPO | Attending: General Surgery | Admitting: Skilled Nursing Facility1

## 2020-09-16 DIAGNOSIS — E669 Obesity, unspecified: Secondary | ICD-10-CM | POA: Insufficient documentation

## 2020-09-16 DIAGNOSIS — E1165 Type 2 diabetes mellitus with hyperglycemia: Secondary | ICD-10-CM | POA: Diagnosis present

## 2020-09-17 NOTE — Progress Notes (Signed)
Established patient visit   Patient: Julia Lynch   DOB: November 03, 1975   44 y.o. Female  MRN: [id number] Visit Date: 09/19/2020  Today's healthcare provider: Margaretann Loveless, PA-C   Chief Complaint  Patient presents with   Follow-up   Subjective    HPI  Diabetes Mellitus Type II, Follow-up  Lab Results  Component Value Date   HGBA1C 7.6 (H) 08/27/2020   HGBA1C 8.9 (H) 06/19/2020   HGBA1C 8.3 (H) 03/05/2020   Wt Readings from Last 3 Encounters:  09/19/20 265 lb 11.2 oz (120.5 kg)  09/17/20 267 lb 9.6 oz (121.4 kg)  09/01/20 283 lb 9.6 oz (128.6 kg)   Last seen for diabetes 3 months ago.  Management since then includes Will stop HCTZ alone and change to lisinopril-HCTZ Continue current metformin XR 500mg . Will consider starting Ozempic or Trulicity (whichever is covered) to help with glucose control as well as weight loss. F/U in 3 months  She reports excellent compliance with treatment. She is not having side effects.   Symptoms: No fatigue No foot ulcerations  No appetite changes No nausea  No paresthesia of the feet  No polydipsia  No polyuria No visual disturbances   No vomiting     Home blood sugar records: fasting range: 120's  Episodes of hypoglycemia? No    Current insulin regiment: none Most Recent Eye Exam: )02/2020 Current exercise: walking Current diet habits: bariatric surgery Nov 22,2021 following bariatric diet  Patient did recently have bariatric surgery and is doing very well.    Pertinent Labs: Lab Results  Component Value Date   CHOL 200 (H) 06/19/2020   HDL 37 (L) 06/19/2020   LDLCALC 108 (H) 06/19/2020   TRIG 319 (H) 06/19/2020   CHOLHDL 5.4 (H) 06/19/2020   Lab Results  Component Value Date   NA 137 09/02/2020   K 4.4 09/02/2020   CREATININE 0.67 09/02/2020   GFRNONAA >60 09/02/2020   GFRAA 125 06/19/2020   GLUCOSE 177 (H) 09/02/2020      ---------------------------------------------------------------------------------------------------  Patient Active Problem List   Diagnosis Date Noted   S/P laparoscopic sleeve gastrectomy 09/01/2020   Menorrhagia with regular cycle 02/07/2020   Class 3 severe obesity due to excess calories with serious comorbidity and body mass index (BMI) of 50.0 to 59.9 in adult (HCC) 11/13/2018   Hypercholesterolemia with hypertriglyceridemia 11/13/2018   Shortness of breath 11/01/2017   Obstructive sleep apnea 09/30/2017   Diabetes mellitus (HCC) 07/28/2017   History of postpartum depression 05/20/2017   Benign essential HTN 10/17/2015   Anxiety 09/26/2015   Arthritis 09/26/2015   Asthma 09/26/2015   Heartburn 09/26/2015   Back ache 09/26/2015   Migraine 09/26/2015   Morbidly obese (HCC) 09/26/2015   PMS (premenstrual syndrome) 09/26/2015   Situational depression 09/26/2015   Generalized headaches 09/26/2015   Esophageal reflux 08/20/2009   Vitamin D deficiency 01/14/2009   Fatigue 01/13/2009   History of gestational diabetes 01/13/2009   Hypersomnia 01/13/2009   Polycystic ovaries 10/11/1998   Past Medical History:  Diagnosis Date   Anxiety    Arthritis    Asthma 2006   h/o    Depression    Diabetic acidosis, type II (HCC)    GERD (gastroesophageal reflux disease)    Headache    History of chicken pox    History of kidney stones    h/o   Hypertension    PCOS (polycystic ovarian syndrome)    Sleep apnea    uses cpap  Vitamin D deficiency disease        Medications: Outpatient Medications Prior to Visit  Medication Sig   atorvastatin (LIPITOR) 20 MG tablet Take 1 tablet (20 mg total) by mouth daily.   citalopram (CELEXA) 40 MG tablet TAKE 1 TABLET BY MOUTH EVERY DAY   cyclobenzaprine (FLEXERIL) 5 MG tablet Take 1 tablet (5 mg total) by mouth 3 (three) times daily as needed for muscle spasms. (Patient taking differently: Take 5  mg by mouth 3 (three) times daily as needed (Headache).)   diazepam (VALIUM) 5 MG tablet TAKE AS NEEDED FOR HEADACHES, NO MORE THAN 3 IN A DAY (Patient taking differently: Take 5 mg by mouth daily as needed (Headache).)   lisinopril-hydrochlorothiazide (ZESTORETIC) 20-25 MG tablet Take 1 tablet by mouth daily.   Multiple Vitamin (MULTIVITAMIN ADULT PO) Take 1 tablet by mouth daily.    pantoprazole (PROTONIX) 40 MG tablet Take 1 tablet (40 mg total) by mouth daily.   valACYclovir (VALTREX) 500 MG tablet Take 1 tablet (500 mg total) by mouth daily. Can increase to twice a day for 5 days in the event of a recurrence (Patient taking differently: Take 500 mg by mouth daily as needed (flair up). Can increase to twice a day for 5 days in the event of a recurrence)   [DISCONTINUED] Cholecalciferol (VITAMIN D) 50 MCG (2000 UT) CAPS Take 2,000 Units by mouth daily.   [DISCONTINUED] ondansetron (ZOFRAN-ODT) 4 MG disintegrating tablet Take 1 tablet (4 mg total) by mouth every 6 (six) hours as needed for nausea or vomiting.   [DISCONTINUED] traMADol (ULTRAM) 50 MG tablet Take 1 tablet (50 mg total) by mouth every 6 (six) hours as needed (pain).   No facility-administered medications prior to visit.    Review of Systems  Constitutional: Negative.   Respiratory: Negative.   Cardiovascular: Negative.   Endocrine: Negative.   Neurological: Negative.     Last CBC Lab Results  Component Value Date   WBC 12.3 (H) 09/02/2020   HGB 13.0 09/02/2020   HCT 39.3 09/02/2020   MCV 85.4 09/02/2020   MCH 28.3 09/02/2020   RDW 13.3 09/02/2020   PLT 259 09/02/2020   Last metabolic panel Lab Results  Component Value Date   GLUCOSE 177 (H) 09/02/2020   NA 137 09/02/2020   K 4.4 09/02/2020   CL 104 09/02/2020   CO2 25 09/02/2020   BUN 10 09/02/2020   CREATININE 0.67 09/02/2020   GFRNONAA >60 09/02/2020   GFRAA 125 06/19/2020   CALCIUM 8.5 (L) 09/02/2020   PROT 6.8 09/02/2020   ALBUMIN 3.6  09/02/2020   LABGLOB 2.7 06/19/2020   AGRATIO 1.4 06/19/2020   BILITOT 0.6 09/02/2020   ALKPHOS 60 09/02/2020   AST 37 09/02/2020   ALT 58 (H) 09/02/2020   ANIONGAP 8 09/02/2020      Objective    BP 116/69 (BP Location: Left Wrist, Patient Position: Sitting, Cuff Size: Normal)    Pulse 83    Temp 98.2 F (36.8 C) (Oral)    Resp 16    Wt 265 lb 11.2 oz (120.5 kg)    BMI 50.20 kg/m  BP Readings from Last 3 Encounters:  09/19/20 116/69  09/02/20 (!) 156/91  08/27/20 (!) 124/46   Wt Readings from Last 3 Encounters:  09/19/20 265 lb 11.2 oz (120.5 kg)  09/17/20 267 lb 9.6 oz (121.4 kg)  09/01/20 283 lb 9.6 oz (128.6 kg)      Physical Exam Vitals reviewed.  Constitutional:  General: She is not in acute distress.    Appearance: Normal appearance. She is well-developed and well-nourished. She is obese. She is not ill-appearing or diaphoretic.  HENT:     Head: Normocephalic and atraumatic.  Cardiovascular:     Rate and Rhythm: Normal rate and regular rhythm.     Heart sounds: Normal heart sounds. No murmur heard. No friction rub. No gallop.   Pulmonary:     Effort: Pulmonary effort is normal. No respiratory distress.     Breath sounds: Normal breath sounds. No wheezing or rales.  Musculoskeletal:     Cervical back: Normal range of motion and neck supple.     Right lower leg: No edema.     Left lower leg: No edema.  Skin:    General: Skin is warm and dry.  Neurological:     General: No focal deficit present.     Mental Status: She is alert. Mental status is at baseline.  Psychiatric:        Mood and Affect: Mood normal.        Thought Content: Thought content normal.       No results found for any visits on 09/19/20.  Assessment & Plan     1. Type 2 diabetes mellitus with hyperglycemia, without long-term current use of insulin (HCC) Will stop medications for diabetes at this time since she recently had bariatric surgery, is losing weight rapidly, to avoid  hypoglycemic episodes. Will add back over time if needed. F/U in 3 months.   2. Benign essential HTN Currently BP is fantastic. Will continue Lisinopril-HCTZ 20-25mg , take only half tab daily.  Will need to check BP daily because if BP is less than 130/90 I have recommended to not take medication. May even could stop this medication as well and monitor.    Return in about 3 months (around 12/18/2020).      Delmer Islam, PA-C, have reviewed all documentation for this visit. The documentation on 10/13/20 for the exam, diagnosis, procedures, and orders are all accurate and complete.   Reine Just  [facility] [phone] (phone) [phone] (fax)  [facility]

## 2020-09-17 NOTE — Progress Notes (Signed)
2 Week Post-Operative Nutrition Class   Patient was seen on 12/05/18 for Post-Operative Nutrition education at the [facility].    Surgery date: 09/01/2020 Surgery type: sleeve Start weight at [facility]: 294.7 Weight today: 267.6  Body Composition Scale 09/16/2020  Total Body Fat % 48.6  Visceral Fat 20  Fat-Free Mass % 51.3   Total Body Water % 40.1   Muscle-Mass lbs 29.6  Body Fat Displacement          Torso  lbs 80.6         Left Leg  lbs 16.1         Right Leg  lbs 16.1         Left Arm  lbs 8.0         Right Arm   lbs 8.0     The following the learning objectives were met by the patient during this course:  Identifies Phase 3 (Soft, High Proteins) Dietary Goals and will begin from 2 weeks post-operatively to 2 months post-operatively  Identifies appropriate sources of fluids and proteins   Identifies appropriate fat sources and healthy verses unhealthy fat types    States protein recommendations and appropriate sources post-operatively  Identifies the need for appropriate texture modifications, mastication, and bite sizes when consuming solids  Identifies appropriate multivitamin and calcium sources post-operatively  Describes the need for physical activity post-operatively and will follow MD recommendations  States when to call healthcare provider regarding medication questions or post-operative complications   Handouts given during class include:  Phase 3A: Soft, High Protein Diet Handout  Phase 3 High Protein Meals  Healthy Fats   Follow-Up Plan: Patient will follow-up at [facility] in 6 weeks for 2 month post-op nutrition visit for diet advancement per MD.

## 2020-09-19 ENCOUNTER — Other Ambulatory Visit: Payer: Self-pay

## 2020-09-19 ENCOUNTER — Encounter: Payer: Self-pay | Admitting: Physician Assistant

## 2020-09-19 ENCOUNTER — Ambulatory Visit (INDEPENDENT_AMBULATORY_CARE_PROVIDER_SITE_OTHER): Payer: BC Managed Care – PPO | Admitting: Physician Assistant

## 2020-09-19 VITALS — BP 116/69 | HR 83 | Temp 98.2°F | Resp 16 | Wt 265.7 lb

## 2020-09-19 DIAGNOSIS — E1165 Type 2 diabetes mellitus with hyperglycemia: Secondary | ICD-10-CM | POA: Diagnosis not present

## 2020-09-19 DIAGNOSIS — I10 Essential (primary) hypertension: Secondary | ICD-10-CM

## 2020-09-22 ENCOUNTER — Telehealth: Payer: Self-pay | Admitting: Skilled Nursing Facility1

## 2020-09-22 NOTE — Telephone Encounter (Signed)
RD called pt to verify fluid intake once starting soft, solid proteins 2 week post-bariatric surgery.   Daily Fluid intake: 40oz Daily Protein intake: 50g  Concerns/issues:   Pt states she forgets to drink for her fluids. Pt states she is not having any functional issues.   Dietitian advised pt to add a protein shake until she can get to her protein gaol with whole foods. Dietitian advised pt to set an alarm to remind her to drink in order to reach her fluid needs.

## 2020-10-13 ENCOUNTER — Encounter: Payer: Self-pay | Admitting: Physician Assistant

## 2020-10-29 ENCOUNTER — Other Ambulatory Visit: Payer: Self-pay

## 2020-10-29 ENCOUNTER — Encounter: Payer: BC Managed Care – PPO | Attending: General Surgery | Admitting: Skilled Nursing Facility1

## 2020-10-29 DIAGNOSIS — E669 Obesity, unspecified: Secondary | ICD-10-CM | POA: Insufficient documentation

## 2020-10-29 DIAGNOSIS — E119 Type 2 diabetes mellitus without complications: Secondary | ICD-10-CM | POA: Insufficient documentation

## 2020-10-29 NOTE — Progress Notes (Signed)
Bariatric Nutrition Follow-Up Visit Medical Nutrition Therapy  Appt Start Time: 3:40 End Time: 4:05  2 Months Post-Operative sleeve Surgery Surgery Date: 09/01/2021  NUTRITION ASSESSMENT  Anthropometrics  Surgery date: 09/01/2020 Surgery type: sleeve Start weight at [facility]: 294.7 Weight today: 254.1  Body Composition Scale 10/29/2020  Weight  lbs 254.1  Total Body Fat  % 47.5     Visceral Fat 18  Fat-Free Mass  % 52.4     Total Body Water  % 40.7     Muscle-Mass  lbs 29.5  BMI 48.1  Body Fat Displacement ---        Torso  lbs 74.8        Left Leg  lbs 14.9        Right Leg  lbs 14.9        Left Arm  lbs 7.4        Right Arm  lbs 7.4   Clinical  Medical hx: OSA, reflux, anxiety, headaches, diabetes Medications:  Labs: A1C 7.6   Lifestyle & Dietary Hx  Pt states she notices where spicy foods will give her heartburn. Pt states her sleeps has been okay waking in the night sometimes.  Pt state she is trying to get rid of her diet mentality and focus on her health.  Pt states her blood sugars have been around 118.   Estimated daily fluid intake: 64 oz Estimated daily protein intake: 70+ g Supplements: multi, iron, calcium  Current average weekly physical activity: walking, elliptical, strength training: 3 days a week  24-Hr Dietary Recall: sometimes a protein shake in the evening First Meal: 2 eggs + cheese + lunch meat Snack:  Second Meal: 3oz chicken Snack: lunch meat and cheese wrap Third Meal: 3 oz chicken Snack: cheese stick Beverages: coffee + heavy whipping cream + sugar free syrup + collagen, water + flavorings, sugar free twist  Post-Op Goals/ Signs/ Symptoms Using straws: no Drinking while eating: no Chewing/swallowing difficulties: no Changes in vision: no Changes to mood/headaches: no Hair loss/changes to skin/nails: no Difficulty focusing/concentrating: no Sweating: no Dizziness/lightheadedness: no Palpitations: no  Carbonated/caffeinated  beverages: no N/V/D/C/Gas: no Abdominal pain: no Dumping syndrome: no    NUTRITION DIAGNOSIS  Overweight/obesity ([place]-3.3) related to past poor dietary habits and physical inactivity as evidenced by completed bariatric surgery and following dietary guidelines for continued weight loss and healthy nutrition status.   NUTRITION INTERVENTION Nutrition counseling (C-1) and education (E-2) to facilitate bariatric surgery goals, including: . Diet advancement to the next phase (phase 4) now including non starchy vegetables  . The importance of consuming adequate calories as well as certain nutrients daily due to the body's need for essential vitamins, minerals, and fats . The importance of daily physical activity and to reach a goal of at least 150 minutes of moderate to vigorous physical activity weekly (or as directed by their physician) due to benefits such as increased musculature and improved lab values . The importance of intuitive eating specifically learning hunger-satiety cues and understanding the importance of learning a new body: The importance of mindful eating to avoid grazing behaviors   Handouts Provided Include  -Continue to aim for a minimum of 64 fluid ounces 7 days a week with at least 30 ounces being plain water -Eat non-starchy vegetables 2 times a day 7 days a week -Start out with soft cooked vegetables today and tomorrow; if tolerated begin to eat raw vegetables or cooked including salads -Eat your 3 ounces of protein first then start  in on your non-starchy vegetables; once you understand how much of your meal leads to satisfaction and not full while still eating 3 ounces of protein and non-starchy vegetables you can eat them in any order  -Continue to aim for 30 minutes of activity at least 5 times a week  -Do NOT cook with/add to your food: alfredo sauce, cheese sauce, barbeque sauce, ketchup, fat back, butter, bacon grease, grease, Crisco, OR SUGAR  Learning Style &  Readiness for Change Teaching method utilized: Visual & Auditory  Demonstrated degree of understanding via: Teach Back  Readiness Level: Action Barriers to learning/adherence to lifestyle change: none identified   RD's Notes for Next Visit . Assess adherence to pt chosen goals   MONITORING & EVALUATION Dietary intake, weekly physical activity, body weight  Next Steps Patient is to follow-up in 3 months

## 2020-11-13 ENCOUNTER — Ambulatory Visit: Payer: Self-pay | Admitting: Physician Assistant

## 2020-11-13 ENCOUNTER — Emergency Department
Admission: EM | Admit: 2020-11-13 | Discharge: 2020-11-13 | Disposition: A | Discharge status: Home / Self Care | Payer: BC Managed Care – PPO | Attending: Emergency Medicine | Admitting: Emergency Medicine

## 2020-11-13 ENCOUNTER — Other Ambulatory Visit: Payer: Self-pay

## 2020-11-13 ENCOUNTER — Ambulatory Visit: Payer: Self-pay | Admitting: *Deleted

## 2020-11-13 ENCOUNTER — Emergency Department: Payer: BC Managed Care – PPO

## 2020-11-13 DIAGNOSIS — R070 Pain in throat: Secondary | ICD-10-CM | POA: Insufficient documentation

## 2020-11-13 DIAGNOSIS — E1110 Type 2 diabetes mellitus with ketoacidosis without coma: Secondary | ICD-10-CM | POA: Insufficient documentation

## 2020-11-13 DIAGNOSIS — J45909 Unspecified asthma, uncomplicated: Secondary | ICD-10-CM | POA: Insufficient documentation

## 2020-11-13 DIAGNOSIS — Z87891 Personal history of nicotine dependence: Secondary | ICD-10-CM | POA: Insufficient documentation

## 2020-11-13 DIAGNOSIS — R079 Chest pain, unspecified: Secondary | ICD-10-CM | POA: Diagnosis present

## 2020-11-13 DIAGNOSIS — I10 Essential (primary) hypertension: Secondary | ICD-10-CM | POA: Diagnosis not present

## 2020-11-13 DIAGNOSIS — R0789 Other chest pain: Secondary | ICD-10-CM | POA: Insufficient documentation

## 2020-11-13 DIAGNOSIS — Z79899 Other long term (current) drug therapy: Secondary | ICD-10-CM | POA: Insufficient documentation

## 2020-11-13 LAB — CBC
HCT: 40.8 % (ref 36.0–46.0)
Hemoglobin: 13.6 g/dL (ref 12.0–15.0)
MCH: 29 pg (ref 26.0–34.0)
MCHC: 33.3 g/dL (ref 30.0–36.0)
MCV: 87 fL (ref 80.0–100.0)
Platelets: 264 10*3/uL (ref 150–400)
RBC: 4.69 MIL/uL (ref 3.87–5.11)
RDW: 13.2 % (ref 11.5–15.5)
WBC: 9 10*3/uL (ref 4.0–10.5)
nRBC: 0 % (ref 0.0–0.2)

## 2020-11-13 LAB — TROPONIN I (HIGH SENSITIVITY)
Troponin I (High Sensitivity): 2 ng/L (ref <18)
Troponin I (High Sensitivity): 3 ng/L (ref <18)

## 2020-11-13 LAB — BASIC METABOLIC PANEL
Anion gap: 8 (ref 5–15)
BUN: 11 mg/dL (ref 6–20)
CO2: 24 mmol/L (ref 22–32)
Calcium: 9 mg/dL (ref 8.9–10.3)
Chloride: 105 mmol/L (ref 98–111)
Creatinine, Ser: 0.57 mg/dL (ref 0.44–1.00)
GFR, Estimated: >60 mL/min (ref >60)
Glucose, Bld: 107 mg/dL — ABNORMAL HIGH (ref 70–99)
Potassium: 3.9 mmol/L (ref 3.5–5.1)
Sodium: 137 mmol/L (ref 135–145)

## 2020-11-13 LAB — FIBRIN DERIVATIVES D-DIMER (ARMC ONLY): Fibrin derivatives D-dimer (ARMC): 693.88 ng/mL (FEU) — ABNORMAL HIGH (ref 0.00–499.00)

## 2020-11-13 MED ORDER — ACETAMINOPHEN 500 MG PO TABS
1000.0000 mg | ORAL_TABLET | Freq: Once | ORAL | Status: AC
  Administered 2020-11-13: 1000 mg via ORAL
  Filled 2020-11-13: qty 2

## 2020-11-13 MED ORDER — IOHEXOL 350 MG/ML SOLN
100.0000 mL | Freq: Once | INTRAVENOUS | Status: AC | PRN
  Administered 2020-11-13: 100 mL via INTRAVENOUS

## 2020-11-13 MED ORDER — ALUM & MAG HYDROXIDE-SIMETH 200-200-20 MG/5ML PO SUSP
30.0000 mL | Freq: Once | ORAL | Status: AC
  Administered 2020-11-13: 30 mL via ORAL
  Filled 2020-11-13: qty 30

## 2020-11-13 MED ORDER — ACETAMINOPHEN 500 MG PO TABS
ORAL_TABLET | ORAL | Status: AC
  Filled 2020-11-13: qty 2

## 2020-11-13 MED ORDER — LIDOCAINE VISCOUS HCL 2 % MT SOLN
15.0000 mL | Freq: Once | OROMUCOSAL | Status: AC
  Administered 2020-11-13: 15 mL via ORAL
  Filled 2020-11-13: qty 15

## 2020-11-13 NOTE — Telephone Encounter (Signed)
I spoke with Julia Lynch.  I advised her to call her surgeon right away with the symptoms she is describing.  I also stated that if she can't get in contact with them to go the ER to be evaluated.  Pt agreed to the plan.   Thanks,   -Vernona Rieger

## 2020-11-13 NOTE — Telephone Encounter (Signed)
Yes I do agree. Patient had bariatric surgery in 08/2020 and with these symptoms this could be concerning for complication from that.

## 2020-11-13 NOTE — ED Provider Notes (Signed)
[facility] Emergency Department Provider Note  ____________________________________________   Event Date/Time   First MD Initiated Contact with Patient 11/13/20 1655     (approximate)  I have reviewed the triage vital signs and the nursing notes.   HISTORY  Chief Complaint Chest Pain    HPI Julia Lynch is a 45 y.o. female with diabetes, asthma, hypertension, anxiety, gastric sleeve surgery who comes in with chest pain.  Patient reports having chest pain that started yesterday.  The pain is across her entire upper chest and around into her back.  She states the pain is worse when she takes a deep breath but does not only have any shortness of breath.  She also has pain in the lower part of her throat when she swallows.  She states that it all started the same time, constant, not better with gas medications.  She denies ever having this previously.  Patient does report a history of having gastric sleeve surgery in November.  She denies any abdominal pain, nausea, vomiting.  She was sent here by her bariatric surgeon to make sure that there is nothing wrong with her heart.  Denies any leg swelling, estrogen use          Past Medical History:  Diagnosis Date  . Anxiety   . Arthritis   . Asthma 2006   h/o   . Depression   . Diabetic acidosis, type II (HCC)   . GERD (gastroesophageal reflux disease)   . Headache   . History of chicken pox   . History of kidney stones    h/o  . Hypertension   . PCOS (polycystic ovarian syndrome)   . Sleep apnea    uses cpap  . Vitamin D deficiency disease     Patient Active Problem List   Diagnosis Date Noted  . 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 Menorrhagia with regular cycle 02/07/2020  . Class 3 severe obesity due to excess calories with serious comorbidity and body mass index (BMI) of 50.0 to 59.9 in adult (HCC) 11/13/2018  . Hypercholesterolemia with hypertriglyceridemia 11/13/2018  .  Shortness of breath 11/01/2017  . Obstructive sleep apnea 09/30/2017  . Diabetes mellitus (HCC) 07/28/2017  . History of postpartum depression 05/20/2017  . Benign essential HTN 10/17/2015  . Anxiety 09/26/2015  . Arthritis 09/26/2015  . Asthma 09/26/2015  . Heartburn 09/26/2015  . Back ache 09/26/2015  . Migraine 09/26/2015  . Morbidly obese (HCC) 09/26/2015  . PMS (premenstrual syndrome) 09/26/2015  . Situational depression 09/26/2015  . Generalized headaches 09/26/2015  . Esophageal reflux 08/20/2009  . Vitamin D deficiency 01/14/2009  . Fatigue 01/13/2009  . History of gestational diabetes 01/13/2009  . Hypersomnia 01/13/2009  . Polycystic ovaries 10/11/1998    Past Surgical History:  Procedure Laterality Date  . ENDOMETRIAL ABLATION Bilateral 03/07/2020   Procedure: ENDOMETRIAL ABLATION;  Surgeon: Hildred Laser, MD;  Location: [facility] ORS;  Service: Gynecology;  Laterality: Bilateral;  . HYSTEROSCOPY WITH D & C Bilateral 03/07/2020   Procedure: DILATATION AND CURETTAGE /HYSTEROSCOPY;  Surgeon: Hildred Laser, MD;  Location: [facility] ORS;  Service: Gynecology;  Laterality: Bilateral;  . left ankle repair  1995  . UPPER GI ENDOSCOPY N/A 09/01/2020   Procedure: UPPER GI ENDOSCOPY;  Surgeon: Gaynelle Adu, MD;  Location: [facility] ORS;  Service: General;  Laterality: N/A;    Prior to Admission medications   Medication Sig Start Date End Date Taking? Authorizing Provider  atorvastatin (LIPITOR) 20 MG tablet Take  1 tablet (20 mg total) by mouth daily. 06/20/20   Margaretann Loveless, PA-C  citalopram (CELEXA) 40 MG tablet TAKE 1 TABLET BY MOUTH EVERY DAY 09/14/20   Margaretann Loveless, PA-C  cyclobenzaprine (FLEXERIL) 5 MG tablet Take 1 tablet (5 mg total) by mouth 3 (three) times daily as needed for muscle spasms. Patient taking differently: Take 5 mg by mouth 3 (three) times daily as needed (Headache). 06/28/18   Bacigalupo, Marzella Schlein, MD  diazepam (VALIUM) 5 MG tablet TAKE AS NEEDED FOR  HEADACHES, NO MORE THAN 3 IN A DAY Patient taking differently: Take 5 mg by mouth daily as needed (Headache). 07/23/19   Margaretann Loveless, PA-C  lisinopril-hydrochlorothiazide (ZESTORETIC) 20-25 MG tablet Take 1 tablet by mouth daily. 06/18/20   Margaretann Loveless, PA-C  Multiple Vitamin (MULTIVITAMIN ADULT PO) Take 1 tablet by mouth daily.     [provider]  pantoprazole (PROTONIX) 40 MG tablet Take 1 tablet (40 mg total) by mouth daily. 09/02/20   Gaynelle Adu, MD  valACYclovir (VALTREX) 500 MG tablet Take 1 tablet (500 mg total) by mouth daily. Can increase to twice a day for 5 days in the event of a recurrence Patient taking differently: Take 500 mg by mouth daily as needed (flair up). Can increase to twice a day for 5 days in the event of a recurrence 05/01/18   Lawhorn, Vanessa Sheldon, CNM    Allergies Ibuprofen and Lodine [etodolac]  Family History  Problem Relation Age of Onset  . Diabetes Father   . Hypertension Father   . Hepatitis C Mother   . Heart attack Maternal Grandfather   . Hypertension Paternal Grandmother   . Diabetes Paternal Grandmother     Social History Social History   Tobacco Use  . Smoking status: Former Smoker    Packs/day: 0.50    Years: 10.00    Pack years: 5.00    Types: Cigarettes    Quit date: 10/11/2004    Years since quitting: 16.1  . Smokeless tobacco: Never Used  Vaping Use  . Vaping Use: Never used  Substance Use Topics  . Alcohol use: Not Currently    Alcohol/week: 0.0 standard drinks  . Drug use: No      Review of Systems Constitutional: No fever/chills Eyes: No visual changes. ENT: No sore throat. Cardiovascular: Positive chest pain Respiratory: Denies shortness of breath.   Gastrointestinal: No abdominal pain.  No nausea, no vomiting.  No diarrhea.  No constipation. Genitourinary: Negative for dysuria. Musculoskeletal: Negative for back pain. Skin: Negative for rash. Neurological: Negative for headaches,  focal weakness or numbness. All other ROS negative ____________________________________________   PHYSICAL EXAM:  VITAL SIGNS: ED Triage Vitals  Enc Vitals Group     BP 11/13/20 1538 134/65     Pulse Rate 11/13/20 1538 84     Resp 11/13/20 1538 18     Temp 11/13/20 1538 98.3 F (36.8 C)     Temp Source 11/13/20 1538 Oral     SpO2 11/13/20 1538 100 %     Weight 11/13/20 1539 245 lb (111.1 kg)     Height 11/13/20 1539 5\' 1"  (1.549 m)     Head Circumference --      Peak Flow --      Pain Score 11/13/20 1536 6     Pain Loc --      Pain Edu? --      Excl. in GC? --     Constitutional: Alert and  oriented. Well appearing and in no acute distress. Eyes: Conjunctivae are normal. EOMI. Head: Atraumatic. Nose: No congestion/rhinnorhea. Mouth/Throat: Mucous membranes are moist.  OP clear, no exudates or swelling of her tonsils Neck: No stridor. Trachea Midline. FROM Cardiovascular: Normal rate, regular rhythm. Grossly normal heart sounds.  Good peripheral circulation.  No chest wall tenderness Respiratory: Normal respiratory effort.  No retractions. Lungs CTAB. Gastrointestinal: Soft and nontender. No distention. No abdominal bruits.  Musculoskeletal: No lower extremity tenderness nor edema.  No joint effusions. Neurologic:  Normal speech and language. No gross focal neurologic deficits are appreciated.  Skin:  Skin is warm, dry and intact. No rash noted. Psychiatric: Mood and affect are normal. Speech and behavior are normal. GU: Deferred   ____________________________________________   LABS (all labs ordered are listed, but only abnormal results are displayed)  Labs Reviewed  BASIC METABOLIC PANEL - Abnormal; Notable for the following components:      Result Value   Glucose, Bld 107 (*)    All other components within normal limits  FIBRIN DERIVATIVES D-DIMER ([facility] ONLY) - Abnormal; Notable for the following components:   Fibrin derivatives D-dimer ([facility]) 693.88 (*)    All  other components within normal limits  CBC  TROPONIN I (HIGH SENSITIVITY)  TROPONIN I (HIGH SENSITIVITY)   ____________________________________________   ED ECG REPORT I, Concha Se, the attending physician, personally viewed and interpreted this ECG.  Normal sinus rate of 80, no ST elevation, no T wave inversions, normal intervals ____________________________________________  RADIOLOGY Vela Prose, personally viewed and evaluated these images (plain radiographs) as part of my medical decision making, as well as reviewing the written report by the radiologist.  ED MD interpretation: No pneumonia or wide mediastinum  Official radiology report(s): DG Chest 2 View  Result Date: 11/13/2020 CLINICAL DATA:  45 year old female with chest pain. EXAM: CHEST - 2 VIEW COMPARISON:  Chest radiograph dated 04/04/2019. FINDINGS: The heart size and mediastinal contours are within normal limits. Both lungs are clear. The visualized skeletal structures are unremarkable. IMPRESSION: No active cardiopulmonary disease. Electronically Signed   By: Elgie Collard M.D.   On: 11/13/2020 16:14   CT Angio Chest PE W and/or Wo Contrast  Result Date: 11/13/2020 CLINICAL DATA:  45 year old female with concern for pulmonary embolism. EXAM: CT ANGIOGRAPHY CHEST WITH CONTRAST TECHNIQUE: Multidetector CT imaging of the chest was performed using the standard protocol during bolus administration of intravenous contrast. Multiplanar CT image reconstructions and MIPs were obtained to evaluate the vascular anatomy. CONTRAST:  OMNIPAQUE IOHEXOL 350 MG/ML SOLN COMPARISON:  Chest radiograph dated 11/13/2020. FINDINGS: Cardiovascular: There is no cardiomegaly or pericardial effusion. The thoracic aorta is unremarkable. Evaluation of the pulmonary arteries is limited due to suboptimal opacification and respiratory motion artifact. No pulmonary artery embolus identified. Mediastinum/Nodes: There is no hilar or mediastinal  adenopathy. The esophagus and thyroid gland are grossly unremarkable. No mediastinal fluid collection. Lungs/Pleura: No focal consolidation, pleural effusion, or pneumothorax. The central airways are patent. Upper Abdomen: Postsurgical changes of gastric bypass. Probable fatty liver. Musculoskeletal: Degenerative changes of the spine. No acute osseous pathology. Review of the MIP images confirms the above findings. IMPRESSION: No acute intrathoracic pathology. No CT evidence of pulmonary embolism. Electronically Signed   By: Elgie Collard M.D.   On: 11/13/2020 19:19    ____________________________________________   PROCEDURES  Procedure(s) performed (including Critical Care):  Procedures   ____________________________________________   INITIAL IMPRESSION / ASSESSMENT AND PLAN / ED COURSE   Julia Grandchild  Lynch was evaluated in Emergency Department on 11/13/2020 for the symptoms described in the history of present illness. She was evaluated in the context of the global COVID-19 pandemic, which necessitated consideration that the patient might be at risk for infection with the SARS-CoV-2 virus that causes COVID-19. Institutional protocols and algorithms that pertain to the evaluation of patients at risk for COVID-19 are in a state of rapid change based on information released by regulatory bodies including the CDC and federal and state organizations. These policies and algorithms were followed during the patient's care in the ED.    Most Likely DDx:  -MSK (atypical chest pain) but will get cardiac markers to evaluate for ACS given risk factors/age -Consider PE given recent bariatric surgery and pleuritic chest pain.  Low Wells score.  Will get D-dimer -Unclear what would be causing the discomfort in the lower part of her throat or if they are related.  She is still able to tolerate secretions and eat food.  I do not think this is related to her gastric sleeve.  Discussed with patient that if she  continues to have this discomfort that she would need to follow-up with GI for endoscopy to rule out mass.   DDx that was also considered d/t potential to cause harm, but was found less likely based on history and physical (as detailed above): -PNA (no fevers, cough but CXR to evaluate) -PNX (reassured with equal b/l breath sounds, CXR to evaluate) -Symptomatic anemia (will get H&H) -Aortic Dissection as no tearing pain and no radiation to the mid back, pulses equal -Pericarditis no rub on exam, EKG changes or hx to suggest dx -Tamponade (no notable SOB, tachycardic, hypotensive) -Esophageal rupture (no h/o diffuse vomitting/no crepitus)  Patient is feeling better after medications. Her headache is now gone. Her cardiac markers are negative x2. D-dimer was slightly elevated so we got a CT PE. PE was negative. No signs of anything abnormal on the neck or esophagus given her discomfort in the lower part. At this point I feel patient safe for discharge home. We'll give her GI follow-up if she continues to have the throat sensation she can follow-up outpatient for endoscopy.  I discussed the provisional nature of ED diagnosis, the treatment so far, the ongoing plan of care, follow up appointments and return precautions with the patient and any family or support people present. They expressed understanding and agreed with the plan, discharged home.       ____________________________________________   FINAL CLINICAL IMPRESSION(S) / ED DIAGNOSES   Final diagnoses:  Atypical chest pain     MEDICATIONS GIVEN DURING THIS VISIT:  Medications  alum & mag hydroxide-simeth (MAALOX/MYLANTA) 200-200-20 MG/5ML suspension 30 mL (30 mLs Oral Given 11/13/20 1726)    And  lidocaine (XYLOCAINE) 2 % viscous mouth solution 15 mL (15 mLs Oral Given 11/13/20 1726)  acetaminophen (TYLENOL) tablet 1,000 mg (1,000 mg Oral Given 11/13/20 1726)  iohexol (OMNIPAQUE) 350 MG/ML injection 100 mL (100 mLs Intravenous  Contrast Given 11/13/20 1905)     ED Discharge Orders    None       Note:  This document was prepared using Dragon voice recognition software and may include unintentional dictation errors.   Concha Se, MD 11/13/20 [phone]

## 2020-11-13 NOTE — ED Notes (Signed)
Patient returned from CT

## 2020-11-13 NOTE — ED Triage Notes (Addendum)
Pt comes via POV from home with c/o CP that radiates to back since last night. Pt states it has since gotten worse.  Pt states pain when she takes a deep breath.  Pt states headache as well.

## 2020-11-13 NOTE — Telephone Encounter (Signed)
Pt called in. She is having sharp pain when she swallows that is in her neck, upper back and across her upper stomach and in both shoulders. It's a sharp pain when I swallow.   "It's hard to explain"   "I don't know what is going on"    "It started yesterday"   Last night I took a Flexeril and 2 Tylenol thinking it would help but I woke up having these same symptoms.   I'm now getting a headache over the last 45 miinutes. A deep breath it hurts also.   I've also gotten a headache over the lat 45 minutes.   My BP is 134/81 just before calling in. Every time I take a deep breath I get this sharp pain too.  All of this started suddenly last night.  I asked her multiple questions to see what these symptoms might indicate.    I had a gastric sleeve done in Nov for my for my weight loss.     No underlying heart or lung issues.    I take Protonix and Celexa.    I no longer have diabetes since the gastric sleeve surgery and weight loss.   No protocol used as the symptoms did not match to anything specific.  I made her an appt for today at 2:40 in office visit with Joycelyn Man, PA-C.   Covid questionnaire completed.  The headache she mentioned just started coming on 45 minutes ago and no other covid symptoms.  I sent this information to [facility] [place] for Joycelyn Man to have.

## 2020-11-13 NOTE — Discharge Instructions (Signed)
You can take Tylenol 1 g every 8 hours for any discomfort. There are no signs of heart attack or blood clots. If you continue to have the throat sensation you can follow-up with GI  Return to the ER for worsening shortness of breath or any other concern

## 2020-12-02 ENCOUNTER — Encounter: Payer: Self-pay | Admitting: Physician Assistant

## 2020-12-24 ENCOUNTER — Ambulatory Visit (INDEPENDENT_AMBULATORY_CARE_PROVIDER_SITE_OTHER): Payer: BC Managed Care – PPO | Admitting: Physician Assistant

## 2020-12-24 ENCOUNTER — Other Ambulatory Visit: Payer: Self-pay

## 2020-12-24 VITALS — BP 126/78 | HR 75 | Temp 98.4°F | Wt 238.0 lb

## 2020-12-24 DIAGNOSIS — E1165 Type 2 diabetes mellitus with hyperglycemia: Secondary | ICD-10-CM | POA: Diagnosis not present

## 2020-12-24 DIAGNOSIS — Z9884 Bariatric surgery status: Secondary | ICD-10-CM

## 2020-12-24 LAB — POCT GLYCOSYLATED HEMOGLOBIN (HGB A1C): Hemoglobin A1C: 5.4 % (ref 4.0–5.6)

## 2020-12-24 NOTE — Progress Notes (Signed)
Established patient visit   Patient: Julia Lynch   DOB: 1976/02/08   45 y.o. Female  MRN: [id number] Visit Date: 12/24/2020  Today's healthcare provider: Margaretann Loveless, PA-C   No chief complaint on file.  Subjective    HPI  Diabetes Mellitus Type II, Follow-up  Lab Results  Component Value Date   HGBA1C 5.4 12/24/2020   HGBA1C 7.6 (H) 08/27/2020   HGBA1C 8.9 (H) 06/19/2020   Wt Readings from Last 3 Encounters:  12/24/20 238 lb (108 kg)  11/13/20 245 lb (111.1 kg)  10/29/20 254 lb 1.6 oz (115.3 kg)   Last seen for diabetes 3 months ago.  Management since then includes medications were discontinued.. She reports good compliance with treatment. She is not having side effects.  Symptoms: No fatigue No foot ulcerations  No appetite changes No nausea  No paresthesia of the feet  No polydipsia  No polyuria No visual disturbances   No vomiting     Home blood sugar records: reported by patient as normal  Episodes of hypoglycemia? No    Current insulin regiment: none   Pertinent Labs: Lab Results  Component Value Date   CHOL 200 (H) 06/19/2020   HDL 37 (L) 06/19/2020   LDLCALC 108 (H) 06/19/2020   TRIG 319 (H) 06/19/2020   CHOLHDL 5.4 (H) 06/19/2020   Lab Results  Component Value Date   NA 137 11/13/2020   K 3.9 11/13/2020   CREATININE 0.57 11/13/2020   GFRNONAA >60 11/13/2020   GFRAA 125 06/19/2020   GLUCOSE 107 (H) 11/13/2020     ---------------------------------------------------------------------------------------------------   Patient Active Problem List   Diagnosis Date Noted  . 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 Menorrhagia with regular cycle 02/07/2020  . Class 3 severe obesity due to excess calories with serious comorbidity and body mass index (BMI) of 50.0 to 59.9 in adult (HCC) 11/13/2018  . Hypercholesterolemia with hypertriglyceridemia 11/13/2018  . Shortness of breath 11/01/2017  . Obstructive sleep apnea  09/30/2017  . Diabetes mellitus (HCC) 07/28/2017  . History of postpartum depression 05/20/2017  . Benign essential HTN 10/17/2015  . Anxiety 09/26/2015  . Arthritis 09/26/2015  . Asthma 09/26/2015  . Heartburn 09/26/2015  . Back ache 09/26/2015  . Migraine 09/26/2015  . Morbidly obese (HCC) 09/26/2015  . PMS (premenstrual syndrome) 09/26/2015  . Situational depression 09/26/2015  . Generalized headaches 09/26/2015  . Esophageal reflux 08/20/2009  . Vitamin D deficiency 01/14/2009  . Fatigue 01/13/2009  . History of gestational diabetes 01/13/2009  . Hypersomnia 01/13/2009  . Polycystic ovaries 10/11/1998   Past Medical History:  Diagnosis Date  . Anxiety   . Arthritis   . Asthma 2006   h/o   . Depression   . Diabetic acidosis, type II (HCC)   . GERD (gastroesophageal reflux disease)   . Headache   . History of chicken pox   . History of kidney stones    h/o  . Hypertension   . PCOS (polycystic ovarian syndrome)   . Sleep apnea    uses cpap  . Vitamin D deficiency disease        Medications: Outpatient Medications Prior to Visit  Medication Sig  . citalopram (CELEXA) 40 MG tablet TAKE 1 TABLET BY MOUTH EVERY DAY  . cyclobenzaprine (FLEXERIL) 5 MG tablet Take 1 tablet (5 mg total) by mouth 3 (three) times daily as needed for muscle spasms. (Patient taking differently: Take 5 mg by mouth 3 (three) times daily  as needed (Headache).)  . diazepam (VALIUM) 5 MG tablet TAKE AS NEEDED FOR HEADACHES, NO MORE THAN 3 IN A DAY (Patient taking differently: Take 5 mg by mouth daily as needed (Headache).)  . Multiple Vitamin (MULTIVITAMIN ADULT PO) Take 1 tablet by mouth daily.   . pantoprazole (PROTONIX) 40 MG tablet Take 1 tablet (40 mg total) by mouth daily.  . valACYclovir (VALTREX) 500 MG tablet Take 1 tablet (500 mg total) by mouth daily. Can increase to twice a day for 5 days in the event of a recurrence (Patient taking differently: Take 500 mg by mouth daily as needed  (flair up). Can increase to twice a day for 5 days in the event of a recurrence)  . [DISCONTINUED] atorvastatin (LIPITOR) 20 MG tablet Take 1 tablet (20 mg total) by mouth daily. (Patient not taking: No sig reported)  . [DISCONTINUED] lisinopril-hydrochlorothiazide (ZESTORETIC) 20-25 MG tablet Take 1 tablet by mouth daily. (Patient not taking: No sig reported)   No facility-administered medications prior to visit.    Review of Systems  Constitutional: Negative.   Respiratory: Negative.   Cardiovascular: Negative.   Musculoskeletal: Negative.   Neurological: Negative.   Psychiatric/Behavioral: Negative.     Last CBC Lab Results  Component Value Date   WBC 9.0 11/13/2020   HGB 13.6 11/13/2020   HCT 40.8 11/13/2020   MCV 87.0 11/13/2020   MCH 29.0 11/13/2020   RDW 13.2 11/13/2020   PLT 264 11/13/2020   Last metabolic panel Lab Results  Component Value Date   GLUCOSE 107 (H) 11/13/2020   NA 137 11/13/2020   K 3.9 11/13/2020   CL 105 11/13/2020   CO2 24 11/13/2020   BUN 11 11/13/2020   CREATININE 0.57 11/13/2020   GFRNONAA >60 11/13/2020   GFRAA 125 06/19/2020   CALCIUM 9.0 11/13/2020   PROT 6.8 09/02/2020   ALBUMIN 3.6 09/02/2020   LABGLOB 2.7 06/19/2020   AGRATIO 1.4 06/19/2020   BILITOT 0.6 09/02/2020   ALKPHOS 60 09/02/2020   AST 37 09/02/2020   ALT 58 (H) 09/02/2020   ANIONGAP 8 11/13/2020       Objective    BP 126/78 (BP Location: Right Arm, Patient Position: Sitting, Cuff Size: Large)   Pulse 75   Temp 98.4 F (36.9 C) (Oral)   Wt 238 lb (108 kg)   SpO2 98%   BMI 44.97 kg/m  BP Readings from Last 3 Encounters:  12/24/20 126/78  11/13/20 133/68  09/19/20 116/69   Wt Readings from Last 3 Encounters:  12/24/20 238 lb (108 kg)  11/13/20 245 lb (111.1 kg)  10/29/20 254 lb 1.6 oz (115.3 kg)       Physical Exam Vitals reviewed.  Constitutional:      General: She is not in acute distress.    Appearance: Normal appearance. She is well-developed.  She is obese. She is not ill-appearing or diaphoretic.  Cardiovascular:     Rate and Rhythm: Normal rate and regular rhythm.     Pulses: Normal pulses.     Heart sounds: Normal heart sounds. No murmur heard. No friction rub. No gallop.   Pulmonary:     Effort: Pulmonary effort is normal. No respiratory distress.     Breath sounds: Normal breath sounds. No wheezing or rales.  Musculoskeletal:     Cervical back: Normal range of motion and neck supple.  Neurological:     Mental Status: She is alert.      Results for orders placed or performed in  visit on 12/24/20  POCT glycosylated hemoglobin (Hb A1C)  Result Value Ref Range   Hemoglobin A1C 5.4 4.0 - 5.6 %   HbA1c POC (<> result, manual entry)     HbA1c, POC (prediabetic range)     HbA1c, POC (controlled diabetic range)      Assessment & Plan     1. Type 2 diabetes mellitus with hyperglycemia, without long-term current use of insulin (HCC) A1c now down to normal range at 5.4 following gastric sleeve. Patient continues to do very well following her procedure. F/U in 6 months.  - POCT glycosylated hemoglobin (Hb A1C)  2. S/P bariatric surgery Doing very well. Down just at 50 pounds. Followed by bariatric surgeon still.    No follow-ups on file.      Delmer Islam, PA-C, have reviewed all documentation for this visit. The documentation on 12/28/20 for the exam, diagnosis, procedures, and orders are all accurate and complete.   Reine Just  [facility] [phone] (phone) [phone] (fax)  [facility]

## 2020-12-28 ENCOUNTER — Encounter: Payer: Self-pay | Admitting: Physician Assistant

## 2021-01-28 ENCOUNTER — Encounter: Payer: BC Managed Care – PPO | Attending: General Surgery | Admitting: Skilled Nursing Facility1

## 2021-01-28 ENCOUNTER — Other Ambulatory Visit: Payer: Self-pay

## 2021-01-28 DIAGNOSIS — E1165 Type 2 diabetes mellitus with hyperglycemia: Secondary | ICD-10-CM | POA: Insufficient documentation

## 2021-01-28 NOTE — Progress Notes (Signed)
Bariatric Nutrition Follow-Up Visit Medical Nutrition Therapy  Appt Start Time: 3:40 End Time: 4:05  Post-Operative sleeve Surgery Surgery Date: 09/01/2021  NUTRITION ASSESSMENT Sticker: yes  Anthropometrics  Surgery date: 09/01/2020 Surgery type: sleeve Start weight at [facility]: 294.7 Weight today: 231.6  Body Composition Scale 10/29/2020 01/28/2021  Weight  lbs 254.1 231.6  Total Body Fat  % 47.5 45.4     Visceral Fat 18 16  Fat-Free Mass  % 52.4 54.5     Total Body Water  % 40.7 41.7     Muscle-Mass  lbs 29.5 29.3  BMI 48.1 43.8  Body Fat Displacement ---         Torso  lbs 74.8 65.1        Left Leg  lbs 14.9 13        Right Leg  lbs 14.9 13        Left Arm  lbs 7.4 6.5        Right Arm  lbs 7.4 6.5   Clinical  Medical hx: OSA, reflux, anxiety, headaches, diabetes Medications:  Labs: A1C 7.6   Lifestyle & Dietary Hx  Pt states she notices where spicy foods will give her heartburn. Pt states her sleeps has been okay waking in the night sometimes.  Pt state she is trying to get rid of her diet mentality and focus on her health.  Pt states her blood sugars have been around 118.   Pt state she has been experiencg some mid to lower abdominal left side pain after eating.  Pt state she cannot eat spicy/acidic foods without reflux and has to take her reflux daily. Pt state she logs a lot of what she eats. Pt states she is highly tempted by sweets and did have some cupcake.  Pt state she has added back in all foods. Pt states she is making a lot of effort to avoid diet mentality. Pt states her husband has been very supportive.   [name]   Estimated daily fluid intake: 64 oz Estimated daily protein intake: 70+ g Supplements: multi fusion, iron, calcium  Current average weekly physical activity: walking, elliptical, strength training: 3 days a week (inconsistent); aiming for 6000 steps per day  24-Hr Dietary Recall: sometimes a  protein shake + half banana + protein shake in the evening First Meal: 2 eggs + cheese + lunch meat or 2 slices keto bread + cheese + ham and [place] Snack: tomato basil cheese wisps Second Meal: keto bread sandwich or whole avocado or salad + chicken + cheese + blueberries+  Nuts + seeds + peppers + cucumbers + cranberries + sugar free dressing Snack: lunch meat and cheese wrap Third Meal: 3 oz chicken or 3 ounces steak + salad  Snack: cheese stick or 2 cups popcorn + meat and cheese roll up Beverages: coffee + heavy whipping cream + sugar free syrup + collagen, water + flavorings, sugar free twist, gatorade zero  Post-Op Goals/ Signs/ Symptoms Using straws: no Drinking while eating: no Chewing/swallowing difficulties: no Changes in vision: no Changes to mood/headaches: no Hair loss/changes to skin/nails: no Difficulty focusing/concentrating: no Sweating: no Dizziness/lightheadedness: no Palpitations: no  Carbonated/caffeinated beverages: no N/V/D/C/Gas: "runny" for each bowel movement  Abdominal pain: no Dumping syndrome: no    NUTRITION DIAGNOSIS  Overweight/obesity ([place]-3.3) related to past poor dietary habits and physical inactivity as evidenced by completed bariatric surgery and following dietary guidelines for continued weight loss and healthy  nutrition status.   NUTRITION INTERVENTION Nutrition counseling (C-1) and education (E-2) to facilitate bariatric surgery goals, including: . The importance of consuming adequate calories as well as certain nutrients daily due to the body's need for essential vitamins, minerals, and fats . The importance of daily physical activity and to reach a goal of at least 150 minutes of moderate to vigorous physical activity weekly (or as directed by their physician) due to benefits such as increased musculature and improved lab values . The importance of intuitive eating specifically learning hunger-satiety cues and understanding the importance of  learning a new body: The importance of mindful eating to avoid grazing behaviors   Handouts Provided Include   -Cut out the heavy whipping cream -do not eat an entire avocado in one sitting  Learning Style & Readiness for Change Teaching method utilized: Visual & Auditory  Demonstrated degree of understanding via: Teach Back  Readiness Level: Action Barriers to learning/adherence to lifestyle change: none identified   RD's Notes for Next Visit . Assess adherence to pt chosen goals   MONITORING & EVALUATION Dietary intake, weekly physical activity, body weight  Next Steps Patient is to follow-up in 3 months

## 2021-02-02 ENCOUNTER — Ambulatory Visit
Admission: RE | Admit: 2021-02-02 | Discharge: 2021-02-02 | Disposition: A | Discharge status: Home / Self Care | Payer: BC Managed Care – PPO | Source: Ambulatory Visit | Attending: Certified Nurse Midwife | Admitting: Certified Nurse Midwife

## 2021-02-02 ENCOUNTER — Other Ambulatory Visit: Payer: Self-pay

## 2021-02-02 DIAGNOSIS — Z1231 Encounter for screening mammogram for malignant neoplasm of breast: Secondary | ICD-10-CM | POA: Diagnosis not present

## 2021-02-20 ENCOUNTER — Encounter: Payer: Self-pay | Admitting: Obstetrics and Gynecology

## 2021-04-28 ENCOUNTER — Other Ambulatory Visit: Payer: Self-pay

## 2021-04-28 ENCOUNTER — Emergency Department: Payer: BC Managed Care – PPO

## 2021-04-28 ENCOUNTER — Emergency Department
Admission: EM | Admit: 2021-04-28 | Discharge: 2021-04-28 | Disposition: A | Discharge status: Home / Self Care | Payer: BC Managed Care – PPO | Attending: Emergency Medicine | Admitting: Emergency Medicine

## 2021-04-28 ENCOUNTER — Ambulatory Visit: Payer: Self-pay | Admitting: *Deleted

## 2021-04-28 DIAGNOSIS — I10 Essential (primary) hypertension: Secondary | ICD-10-CM | POA: Insufficient documentation

## 2021-04-28 DIAGNOSIS — R079 Chest pain, unspecified: Secondary | ICD-10-CM | POA: Insufficient documentation

## 2021-04-28 DIAGNOSIS — R6883 Chills (without fever): Secondary | ICD-10-CM | POA: Diagnosis not present

## 2021-04-28 DIAGNOSIS — E119 Type 2 diabetes mellitus without complications: Secondary | ICD-10-CM | POA: Insufficient documentation

## 2021-04-28 DIAGNOSIS — R61 Generalized hyperhidrosis: Secondary | ICD-10-CM | POA: Insufficient documentation

## 2021-04-28 DIAGNOSIS — Z87891 Personal history of nicotine dependence: Secondary | ICD-10-CM | POA: Insufficient documentation

## 2021-04-28 DIAGNOSIS — R42 Dizziness and giddiness: Secondary | ICD-10-CM | POA: Diagnosis not present

## 2021-04-28 DIAGNOSIS — J45909 Unspecified asthma, uncomplicated: Secondary | ICD-10-CM | POA: Insufficient documentation

## 2021-04-28 LAB — CBC
HCT: 45.9 % (ref 36.0–46.0)
Hemoglobin: 15.8 g/dL — ABNORMAL HIGH (ref 12.0–15.0)
MCH: 29.2 pg (ref 26.0–34.0)
MCHC: 34.4 g/dL (ref 30.0–36.0)
MCV: 84.8 fL (ref 80.0–100.0)
Platelets: 240 10*3/uL (ref 150–400)
RBC: 5.41 MIL/uL — ABNORMAL HIGH (ref 3.87–5.11)
RDW: 13 % (ref 11.5–15.5)
WBC: 7.5 10*3/uL (ref 4.0–10.5)
nRBC: 0 % (ref 0.0–0.2)

## 2021-04-28 LAB — BASIC METABOLIC PANEL
Anion gap: 5 (ref 5–15)
BUN: 18 mg/dL (ref 6–20)
CO2: 26 mmol/L (ref 22–32)
Calcium: 9.3 mg/dL (ref 8.9–10.3)
Chloride: 106 mmol/L (ref 98–111)
Creatinine, Ser: 0.7 mg/dL (ref 0.44–1.00)
GFR, Estimated: >60 mL/min (ref >60)
Glucose, Bld: 117 mg/dL — ABNORMAL HIGH (ref 70–99)
Potassium: 4.7 mmol/L (ref 3.5–5.1)
Sodium: 137 mmol/L (ref 135–145)

## 2021-04-28 LAB — TROPONIN I (HIGH SENSITIVITY): Troponin I (High Sensitivity): 3 ng/L (ref <18)

## 2021-04-28 MED ORDER — MECLIZINE HCL 25 MG PO TABS: 25.0000 mg | ORAL_TABLET | Freq: Three times a day (TID) | ORAL | 0 refills | Status: DC | PRN

## 2021-04-28 NOTE — ED Provider Notes (Signed)
[facility] Emergency Department Provider Note   ____________________________________________   Event Date/Time   First MD Initiated Contact with Patient 04/28/21 1054     (approximate)  I have reviewed the triage vital signs and the nursing notes.   HISTORY  Chief Complaint Chest Pain    HPI Julia Lynch is a 45 y.o. female with below stated past medical history who presents for an episode of dizziness and chest pain  LOCATION: Chest DURATION: Began 3 hours prior to arrival TIMING: Improving since onset SEVERITY: 1/10 QUALITY: Pressure CONTEXT: Patient states she was laying in bed this morning when she had an episode of dizziness with a sensation of the room spinning around her and accompanying chills, diaphoresis, and chest pressure MODIFYING FACTORS: Denies any exacerbating or relieving factors ASSOCIATED SYMPTOMS: Vertigo, chills, diaphoresis   Per medical record review, patient has family history of vertigo and atrial fibrillation          Past Medical History:  Diagnosis Date   Anxiety    Arthritis    Asthma 2006   h/o    Depression    Diabetic acidosis, type II (HCC)    GERD (gastroesophageal reflux disease)    Headache    History of chicken pox    History of kidney stones    h/o   Hypertension    PCOS (polycystic ovarian syndrome)    Sleep apnea    uses cpap   Vitamin D deficiency disease     Patient Active Problem List   Diagnosis Date Noted   S/P laparoscopic sleeve gastrectomy 09/01/2020   Menorrhagia with regular cycle 02/07/2020   Class 3 severe obesity due to excess calories with serious comorbidity and body mass index (BMI) of 50.0 to 59.9 in adult (HCC) 11/13/2018   Hypercholesterolemia with hypertriglyceridemia 11/13/2018   Shortness of breath 11/01/2017   Obstructive sleep apnea 09/30/2017   Diabetes mellitus (HCC) 07/28/2017   History of postpartum depression 05/20/2017   Benign essential HTN  10/17/2015   Anxiety 09/26/2015   Arthritis 09/26/2015   Asthma 09/26/2015   Heartburn 09/26/2015   Back ache 09/26/2015   Migraine 09/26/2015   Morbidly obese (HCC) 09/26/2015   PMS (premenstrual syndrome) 09/26/2015   Situational depression 09/26/2015   Generalized headaches 09/26/2015   Esophageal reflux 08/20/2009   Vitamin D deficiency 01/14/2009   Fatigue 01/13/2009   History of gestational diabetes 01/13/2009   Hypersomnia 01/13/2009   Polycystic ovaries 10/11/1998    Past Surgical History:  Procedure Laterality Date   ENDOMETRIAL ABLATION Bilateral 03/07/2020   Procedure: ENDOMETRIAL ABLATION;  Surgeon: Hildred Laserherry, Anika, MD;  Location: [facility] ORS;  Service: Gynecology;  Laterality: Bilateral;   HYSTEROSCOPY WITH D & C Bilateral 03/07/2020   Procedure: DILATATION AND CURETTAGE /HYSTEROSCOPY;  Surgeon: Hildred Laserherry, Anika, MD;  Location: [facility] ORS;  Service: Gynecology;  Laterality: Bilateral;   left ankle repair  1995   UPPER GI ENDOSCOPY N/A 09/01/2020   Procedure: UPPER GI ENDOSCOPY;  Surgeon: Gaynelle AduWilson, Eric, MD;  Location: [facility] ORS;  Service: General;  Laterality: N/A;    Prior to Admission medications   Medication Sig Start Date End Date Taking? Authorizing Provider  meclizine (ANTIVERT) 25 MG tablet Take 1 tablet (25 mg total) by mouth 3 (three) times daily as needed for dizziness. 04/28/21  Yes Merwyn KatosBradler, Loghan Kurtzman K, MD  citalopram (CELEXA) 40 MG tablet TAKE 1 TABLET BY MOUTH EVERY DAY 09/14/20   Margaretann LovelessBurnette, Jennifer M, PA-C  cyclobenzaprine (FLEXERIL) 5 MG tablet Take  1 tablet (5 mg total) by mouth 3 (three) times daily as needed for muscle spasms. Patient taking differently: Take 5 mg by mouth 3 (three) times daily as needed (Headache). 06/28/18   Bacigalupo, Marzella Schlein, MD  diazepam (VALIUM) 5 MG tablet TAKE AS NEEDED FOR HEADACHES, NO MORE THAN 3 IN A DAY Patient taking differently: Take 5 mg by mouth daily as needed (Headache). 07/23/19   Margaretann Loveless, PA-C  Multiple Vitamin  (MULTIVITAMIN ADULT PO) Take 1 tablet by mouth daily.     [provider]  pantoprazole (PROTONIX) 40 MG tablet Take 1 tablet (40 mg total) by mouth daily. 09/02/20   Gaynelle Adu, MD  valACYclovir (VALTREX) 500 MG tablet Take 1 tablet (500 mg total) by mouth daily. Can increase to twice a day for 5 days in the event of a recurrence Patient taking differently: Take 500 mg by mouth daily as needed (flair up). Can increase to twice a day for 5 days in the event of a recurrence 05/01/18   Lawhorn, Vanessa Brimson, CNM    Allergies Ibuprofen and Lodine [etodolac]  Family History  Problem Relation Age of Onset   Diabetes Father    Hypertension Father    Hepatitis C Mother    Heart attack Maternal Grandfather    Hypertension Paternal Grandmother    Diabetes Paternal Grandmother     Social History Social History   Tobacco Use   Smoking status: Former    Packs/day: 0.50    Years: 10.00    Pack years: 5.00    Types: Cigarettes    Quit date: 10/11/2004    Years since quitting: 16.5   Smokeless tobacco: Never  Vaping Use   Vaping Use: Never used  Substance Use Topics   Alcohol use: Not Currently    Alcohol/week: 0.0 standard drinks   Drug use: No    Review of Systems Constitutional: Chills.  No fever Eyes: No visual changes. ENT: No sore throat. Cardiovascular: Endorses chest pain. Respiratory: Denies shortness of breath. Gastrointestinal: No abdominal pain.  No nausea, no vomiting.  No diarrhea. Genitourinary: Negative for dysuria. Musculoskeletal: Negative for acute arthralgias Skin: Negative for rash. Neurological: Endorses vertigo, negative for headaches, weakness/numbness/paresthesias in any extremity Psychiatric: Negative for suicidal ideation/homicidal ideation   ____________________________________________   PHYSICAL EXAM:  VITAL SIGNS: ED Triage Vitals  Enc Vitals Group     BP 04/28/21 0915 (!) 110/52     Pulse Rate 04/28/21 0915 78     Resp  04/28/21 0915 20     Temp 04/28/21 0915 98.4 F (36.9 C)     Temp Source 04/28/21 0915 Oral     SpO2 04/28/21 0915 99 %     Weight 04/28/21 0916 220 lb (99.8 kg)     Height 04/28/21 0916 5\' 1"  (1.549 m)     Head Circumference --      Peak Flow --      Pain Score 04/28/21 0932 0     Pain Loc --      Pain Edu? --      Excl. in GC? --    Constitutional: Alert and oriented. Well appearing and in no acute distress. Eyes: Conjunctivae are normal. PERRL. Head: Atraumatic. Nose: No congestion/rhinnorhea. Mouth/Throat: Mucous membranes are moist. Neck: No stridor Cardiovascular: Grossly normal heart sounds.  Good peripheral circulation. Respiratory: Normal respiratory effort.  No retractions. Gastrointestinal: Soft and nontender. No distention. Musculoskeletal: No obvious deformities Neurologic:  Normal speech and language. No gross focal neurologic  deficits are appreciated. Skin:  Skin is warm and dry. No rash noted. Psychiatric: Mood and affect are normal. Speech and behavior are normal.  ____________________________________________   LABS (all labs ordered are listed, but only abnormal results are displayed)  Labs Reviewed  BASIC METABOLIC PANEL - Abnormal; Notable for the following components:      Result Value   Glucose, Bld 117 (*)    All other components within normal limits  CBC - Abnormal; Notable for the following components:   RBC 5.41 (*)    Hemoglobin 15.8 (*)    All other components within normal limits  POC URINE PREG, ED  TROPONIN I (HIGH SENSITIVITY)  TROPONIN I (HIGH SENSITIVITY)   ____________________________________________  EKG  ED ECG REPORT I, Merwyn Katos, the attending physician, personally viewed and interpreted this ECG.  Date: 04/28/2021 EKG Time: 0921 Rate: 78 Rhythm: normal sinus rhythm QRS Axis: normal Intervals: normal ST/T Wave abnormalities: normal Narrative Interpretation: no evidence of acute  ischemia  ____________________________________________  RADIOLOGY  ED MD interpretation: 2 view chest x-ray shows no evidence of acute abnormalities including no pneumonia, pneumothorax, or widened mediastinum  Official radiology report(s): DG Chest 2 View  Result Date: 04/28/2021 CLINICAL DATA:  Chest pain. EXAM: CHEST - 2 VIEW COMPARISON:  CTA chest and chest x-ray dated November 13, 2020. FINDINGS: The heart size and mediastinal contours are within normal limits. Both lungs are clear. The visualized skeletal structures are unremarkable. IMPRESSION: No active cardiopulmonary disease. Electronically Signed   By: Obie Dredge M.D.   On: 04/28/2021 10:13    ____________________________________________   PROCEDURES  Procedure(s) performed (including Critical Care):  .1-3 Lead EKG Interpretation  Date/Time: 04/28/2021 11:49 AM Performed by: Merwyn Katos, MD Authorized by: Merwyn Katos, MD     Interpretation: normal     ECG rate:  79   ECG rate assessment: normal     Rhythm: sinus rhythm     Ectopy: none     Conduction: normal     ____________________________________________   INITIAL IMPRESSION / ASSESSMENT AND PLAN / ED COURSE  As part of my medical decision making, I reviewed the following data within the electronic medical record, if available:  Nursing notes reviewed and incorporated, Labs reviewed, EKG interpreted, Old chart reviewed, Radiograph reviewed and Notes from prior ED visits reviewed and incorporated     Workup: ECG, CXR, CBC, BMP, Troponin Findings: ECG: No overt evidence of STEMI. No evidence of Brugadas sign, delta wave, epsilon wave, significantly prolonged QTc, or malignant arrhythmia HS Troponin: Negative x1 Other Labs unremarkable for emergent problems. CXR: Without PTX, PNA, or widened mediastinum Last Stress Test: Never Last Heart Catheterization: Never HEART Score: 2  Given History, Exam, and Workup I have low suspicion for ACS,  Pneumothorax, Pneumonia, Pulmonary Embolus, Tamponade, Aortic Dissection or other emergent problem as a cause for this presentation.  Unclear as to whether this was a case of vertigo however symptoms resolved and they did not recur.  Given patient's family history of arrhythmia, encouraged to follow-up with cardiology  Reassesment: Prior to discharge patients pain was controlled and they were well appearing.  Disposition:  Discharge. Strict return precautions discussed with patient with full understanding. Advised patient to follow up promptly with primary care provider and cardiology        ____________________________________________   FINAL CLINICAL IMPRESSION(S) / ED DIAGNOSES  Final diagnoses:  Chest pain, unspecified type  Chills without fever  Dizziness of unknown etiology  ED Discharge Orders          Ordered    meclizine (ANTIVERT) 25 MG tablet  3 times daily PRN        04/28/21 1142             Note:  This document was prepared using Dragon voice recognition software and may include unintentional dictation errors.    Merwyn Katos, MD 04/28/21 1150

## 2021-04-28 NOTE — Telephone Encounter (Signed)
Pt called in c/o the room spinning around, breaking out in a cold sweat, chest tightness and shortness of breath this morning when she got up at 6:50.   The dizziness has resolved but she is still having the chest tightness and shortness of breath.   Denies radiation of the chest discomfort/tightness.   Denies cardiac history.   No been sick recently.   No new medications.  I have referred her to the ED.   Her husband has gone to work but her mother is going to take her on to the ED at [facility].  She was agreeable to going.  I sent my notes to Dr. Beryle Flock at [facility].

## 2021-04-28 NOTE — ED Triage Notes (Signed)
Pt woke up this morning with dizziness, cold sweats, and CP. Pt states is lasted for 30 secs but her chest is still tight. Pt also has a headache and weakness. Pt states pain is generalized and does not radiate.

## 2021-04-28 NOTE — ED Notes (Signed)
See triage note  Presents with some dizziness and chest pain  States this lasted about 30 secs  also had cold sweats  States that is better but having chest tightness

## 2021-04-28 NOTE — Telephone Encounter (Signed)
Reason for Disposition  [1] Chest pain lasts > 5 minutes AND [2] described as crushing, pressure-like, or heavy  Answer Assessment - Initial Assessment Questions 1. DESCRIPTION: "Describe your dizziness."     6:50 this morning when I woke up the room was spinning for 30 seconds.   My chest got tight and shortness of breath.   I called my husband back into the room.    I started having a cold sweat.   I have a headache coming on and my chest is still tight.     I had gastric sleeve surgery in Nov.   That resolved a lot of issues for me.   I'm not dizzy now  but my chest is still tight.   Sweating has resolved.  My BP 151/88, pulse 69.     2. LIGHTHEADED: "Do you feel lightheaded?" (e.g., somewhat faint, woozy, weak upon standing)     Not now 3. VERTIGO: "Do you feel like either you or the room is spinning or tilting?" (i.e. vertigo)     Room was spinning around but it's stopped now. 4. SEVERITY: "How bad is it?"  "Do you feel like you are going to faint?" "Can you stand and walk?"   - MILD: Feels slightly dizzy, but walking normally.   - MODERATE: Feels unsteady when walking, but not falling; interferes with normal activities (e.g., school, work).   - SEVERE: Unable to walk without falling, or requires assistance to walk without falling; feels like passing out now.      I got up and walked around before my husband left.   I getting ready for work now.  I work at home.   I just wanted an appt. 5. ONSET:  "When did the dizziness begin?"     6:50 this morning only time it's ever happened.    I do take medicine for depression and anxiety.   Nothing new as far as medications. 6. AGGRAVATING FACTORS: "Does anything make it worse?" (e.g., standing, change in head position)     No 7. HEART RATE: "Can you tell me your heart rate?" "How many beats in 15 seconds?"  (Note: not all patients can do this)       69 on BP machine   My heart feels tight 8. CAUSE: "What do you think is causing the dizziness?"      I don't  know really. 9. RECURRENT SYMPTOM: "Have you had dizziness before?" If Yes, ask: "When was the last time?" "What happened that time?"     No 10. OTHER SYMPTOMS: "Do you have any other symptoms?" (e.g., fever, chest pain, vomiting, diarrhea, bleeding)        Chest tightness, sweating, short of breath.   No diarrhea or vomiting.   No nausea with dizziness.   11. PREGNANCY: "Is there any chance you are pregnant?" "When was your last menstrual period?"       No  Protocols used: Dizziness - Lightheadedness-A-AH, Chest Pain-A-AH

## 2021-04-29 ENCOUNTER — Ambulatory Visit: Payer: BC Managed Care – PPO | Admitting: Skilled Nursing Facility1

## 2021-04-29 ENCOUNTER — Telehealth: Payer: BC Managed Care – PPO | Admitting: Skilled Nursing Facility1

## 2021-05-15 ENCOUNTER — Telehealth: Payer: Self-pay

## 2021-05-15 NOTE — Telephone Encounter (Signed)
Appt made

## 2021-05-15 NOTE — Telephone Encounter (Signed)
Copied from CRM [id number]. Topic: Appointment Scheduling - Scheduling Inquiry for Clinic >> May 15, 2021 10:34 AM Gaetana Michaelis A wrote: Reason for CRM: Patient would like to be seen by [facility]. Suzie Portela for their 6 month follow up   Please contact further to coordinate scheduling

## 2021-05-21 ENCOUNTER — Encounter: Payer: BC Managed Care – PPO | Admitting: Certified Nurse Midwife

## 2021-05-24 ENCOUNTER — Other Ambulatory Visit: Payer: Self-pay | Admitting: Physician Assistant

## 2021-05-29 ENCOUNTER — Encounter: Payer: BC Managed Care – PPO | Admitting: Obstetrics and Gynecology

## 2021-06-03 ENCOUNTER — Encounter: Payer: Self-pay | Admitting: Dietician

## 2021-06-03 ENCOUNTER — Encounter: Payer: BC Managed Care – PPO | Attending: General Surgery | Admitting: Dietician

## 2021-06-03 ENCOUNTER — Other Ambulatory Visit: Payer: Self-pay

## 2021-06-03 VITALS — Ht 61.0 in | Wt 218.5 lb

## 2021-06-03 DIAGNOSIS — Z6841 Body Mass Index (BMI) 40.0 and over, adult: Secondary | ICD-10-CM

## 2021-06-03 NOTE — Patient Instructions (Signed)
Best timing for meals and snacks is every 3-4 hours. Allow at least 2 hours between, and avoid stretches longer than 6 hours without eating.  Continue with current eating pattern, great job including a variety of foods and meeting protein and fluid goals.

## 2021-06-03 NOTE — Progress Notes (Signed)
Nutrition Therapy for Post-Operative Bariatric Diet Follow-up visit:  9 months post-op sleeve gastrectomy Surgery  Medical Nutrition Therapy:  Appt start time: 0930 end time:  1030  Anthropometrics:   Date 07/11/20 09/16/21 10/29/20 01/28/21 06/03/21 InBody scale  BMI 55.17 48.6 48.1 43.8 41.3  Weight (lbs) 292.1 267.6 254.1 231.6 218.5  Skeletal muscle (lbs)   ---- 29.6 29.5 29.3 60.2  % body fat    ---- 48.6 47.5 45.4 50.1   Clinical: Medications: citalopram, cyclobenzaprine prn, diazepam prn, meclizine prn, pantoprazole, valACYclovir Supplementation: Bariatric Fusion multivitamin tabs 2x daily, calcium citrate 500mg  3x daily  Health/ medical history changes: episode of dizziness, went to ED, not cardiovascular, now taking meclizine GI symptoms: N/V/D/C: see below Dumping Syndrome: 1 episode yesterday after eating caramel popcorn; 2-3 episodes total since surgery Hair loss: yes; takes keto collagen powder in coffee daily  Dietary/ Lifestyle Progress: Patient reports having weight loss plateau for past 6-8 weeks, but weight loss has resumed over the past week.  Total weight loss of over 80lbs from her highest weight (he reports about 308lbs at one point prior to surgery) She had been tracking intake, felt she was getting too focused on calories and other details; now sporadically using app "Ate" for less focus on calories and other things. She reports averaging 1500kcal daily most recently. She has worked on mindful eating since her previous MNT visit; is focusing on her hunger level to avoid grazing behavior; she is teaching her children to do the same.  Exercise has been limited over the past month due to her episode of dizziness; she is planning to resume gym workouts within the next week.  Dietary recall: Eating pattern: 3 meals and 2-3 snacks Dining out: 0-1 per week; recently mello mushroom few bites pizza + toppings; salad at [facility] with own sugar free dressing Breakfast: coffee  with collagen powder, then 2 eggs + cheese or sandwich on keto bread; occ fairlife shake Snack: cheese wisps Lunch: salad with tomato, cucumber, peppers, nut and seed blend, sometimes grilled chicken or deli meat and cheese rollup, sugar free dressing Snack: rarely "treat" snack ie cheetos; usually fruit or cheese ie cottage cheese and fruit; low sugar/ fat Greek yogurt with keto granola; 2c popcorn popped with avocado oil, 2-3x a week Dinner: grilled fish/ chicken + veg + few bites starch ie potato/ macaroni Snack:  1oz cheese daily   Fluid intake: water some with sugar free flavoring; gatorade zero, sf Nature's Twist, decaf tea with stevia, coffee in am -- total 64oz+ Estimated total protein intake: >60g daily Bariatric diet adherence:  Using straws: yes, helps with drinking more, denies any GI issues Drinking fluids during meals: yes other than occasional sip if needed Carbonated beverages: no  Recent physical activity:  less recently due to vertigo episode; plans to resume; usu goals to gym with son who enjoys. Walking 30 minutes daily + strength training 3x a week   Nutrition Intervention:   Reviewed progress since previous MNT visit Patient has been implementing goals set at that visit,and she is motivated to continue. Discussed best use of food tracking; ie tracking a few days periodically as a "check up" to avoid excess burden/ stress/ focus on numbers Discussed continuation of eating protein foods first (enough to reach daily goal), followed by low carb vegetables, and keeping portions of starches and fruits small.  Discussed role of physical activity and resuming regular exercise as soon as able.  Nutritional Diagnosis:  [place]-3.3 Overweight/obesity As related to history  of excess calories and inadequate physical activity.  As evidenced by patient with current BMI of 41.3, following bariatric diet guidelines for ongoing weight loss after bariatric surgery.  Teaching Method Utilized:   Public relations account executive provided: 1000-1500kcal bariatric menus (AND) Visit summary with goals/ instructions  Learning Readiness:  Change in progress  Barriers to learning/adherence to lifestyle change: none  Demonstrated degree of understanding via:  Teach Back      Plan: Return for MNT follow up at 12 months post-op: 09/09/21 at 9:30am

## 2021-06-04 ENCOUNTER — Other Ambulatory Visit: Payer: Self-pay

## 2021-06-04 ENCOUNTER — Ambulatory Visit (INDEPENDENT_AMBULATORY_CARE_PROVIDER_SITE_OTHER): Payer: BC Managed Care – PPO | Admitting: Obstetrics and Gynecology

## 2021-06-04 ENCOUNTER — Encounter: Payer: Self-pay | Admitting: Obstetrics and Gynecology

## 2021-06-04 VITALS — BP 132/79 | HR 82 | Ht 61.0 in | Wt 218.4 lb

## 2021-06-04 DIAGNOSIS — Z13220 Encounter for screening for lipoid disorders: Secondary | ICD-10-CM

## 2021-06-04 DIAGNOSIS — Z9889 Other specified postprocedural states: Secondary | ICD-10-CM

## 2021-06-04 DIAGNOSIS — I10 Essential (primary) hypertension: Secondary | ICD-10-CM | POA: Diagnosis not present

## 2021-06-04 DIAGNOSIS — Z1231 Encounter for screening mammogram for malignant neoplasm of breast: Secondary | ICD-10-CM | POA: Diagnosis not present

## 2021-06-04 DIAGNOSIS — Z01419 Encounter for gynecological examination (general) (routine) without abnormal findings: Secondary | ICD-10-CM | POA: Diagnosis not present

## 2021-06-04 DIAGNOSIS — Z9884 Bariatric surgery status: Secondary | ICD-10-CM

## 2021-06-04 DIAGNOSIS — Z131 Encounter for screening for diabetes mellitus: Secondary | ICD-10-CM | POA: Insufficient documentation

## 2021-06-04 NOTE — Progress Notes (Signed)
GYNECOLOGY ANNUAL PHYSICAL EXAM PROGRESS NOTE  Subjective:    Julia Lynch is a 45 y.o. [phone] female who presents for an annual exam.  The patient is sexually active. 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  Has the patient ever been transfused or tattooed?: no. The patient reports that there is not domestic violence in her life.   The patient has the following complaints today: Had Gastric sleeve in November 2021.  Has lost a total of 90 lbs since weight loss journey began (70 lbs since energy). No longer has diabetes, and BPs have improved. Also believes her sleep apnea is resolving as well.  Goal weight is 140-160 lbs.  2.  Reports vaginal spotting this past month. Is the first time she has experienced any bleeding since she had her endometrial ablation last year.    Menstrual History: Menarche age: 59 No LMP recorded. Patient has had an ablation.    Gynecologic History:  Contraception: vasectomy History of STI's: Denies Last Pap: 10/13/2018. Results were: normal.  Denies h/o abnormal pap smears. Last mammogram: 02/02/2021. Results were: normal     Upstream - 06/04/21 1203       Pregnancy Intention Screening   Does the patient want to become pregnant in the next year? No    Does the patient's partner want to become pregnant in the next year? No    Would the patient like to discuss contraceptive options today? No      Contraception Wrap Up   Current Method Vasectomy    End Method Vasectomy    Contraception Counseling Provided No            The pregnancy intention screening data noted above was reviewed. Potential methods of contraception were discussed. The patient elected to proceed with Vasectomy.   OB History  Gravida Para Term Preterm AB Living  4 3 3  0 1 3  SAB IAB Ectopic Multiple Live Births  1 0 0 0 3    # Outcome Date GA Lbr Len/2nd Weight Sex Delivery Anes PTL Lv  4 Term  05/20/12   7 lb (3.175 kg) M Vag-Spont  N LIV  3 Term 01/04/11   9 lb 3 oz (4.167 kg) M Vag-Spont  N LIV  2 Term 08/29/06   7 lb 2 oz (3.232 kg) M Vag-Spont  N LIV  1 SAB 1998            Past Medical History:  Diagnosis Date   Anxiety    Arthritis    Asthma 2006   h/o    Depression    Diabetic acidosis, type II (HCC)    GERD (gastroesophageal reflux disease)    Headache    History of chicken pox    History of kidney stones    h/o   Hypertension    PCOS (polycystic ovarian syndrome)    Sleep apnea    uses cpap   Vitamin D deficiency disease     Past Surgical History:  Procedure Laterality Date   ENDOMETRIAL ABLATION Bilateral 03/07/2020   Procedure: ENDOMETRIAL ABLATION;  Surgeon: 03/09/2020, MD;  Location: [facility] ORS;  Service: Gynecology;  Laterality: Bilateral;   HYSTEROSCOPY WITH D & C Bilateral 03/07/2020   Procedure: DILATATION AND CURETTAGE /HYSTEROSCOPY;  Surgeon: 03/09/2020, MD;  Location: [facility] ORS;  Service: Gynecology;  Laterality: Bilateral;   left ankle repair  1995   UPPER GI ENDOSCOPY N/A 09/01/2020  Procedure: UPPER GI ENDOSCOPY;  Surgeon: Gaynelle Adu, MD;  Location: [facility];  Service: General;  Laterality: N/A;    Family History  Problem Relation Age of Onset   Diabetes Father    Hypertension Father    Hepatitis C Mother    Heart attack Maternal Grandfather    Hypertension Paternal Grandmother    Diabetes Paternal Grandmother     Social History   Socioeconomic History   Marital status: Married    Spouse name: Not on file   Number of children: 3   Years of education: Not on file   Highest education level: Not on file  Occupational History   Not on file  Tobacco Use   Smoking status: Former    Packs/day: 0.50    Years: 10.00    Pack years: 5.00    Types: Cigarettes    Quit date: 10/11/2004    Years since quitting: 16.6   Smokeless tobacco: Never  Vaping Use   Vaping Use: Never used  Substance and Sexual Activity   Alcohol use: Not  Currently    Alcohol/week: 0.0 standard drinks   Drug use: No   Sexual activity: Not Currently    Comment: husband-vasectomy  Other Topics Concern   Not on file  Social History Narrative   Not on file   Social Determinants of Health   Financial Resource Strain: Not on file  Food Insecurity: Not on file  Transportation Needs: Not on file  Physical Activity: Not on file  Stress: Not on file  Social Connections: Not on file  Intimate Partner Violence: Not on file    Current Outpatient Medications on File Prior to Visit  Medication Sig Dispense Refill   CALCIUM CITRATE PO Take 1 each by mouth in the morning, at noon, and at bedtime. Fusion chews 500mg      citalopram (CELEXA) 40 MG tablet TAKE 1 TABLET BY MOUTH EVERY DAY 30 tablet 1   cyclobenzaprine (FLEXERIL) 5 MG tablet Take 1 tablet (5 mg total) by mouth 3 (three) times daily as needed for muscle spasms. (Patient taking differently: Take 5 mg by mouth 3 (three) times daily as needed (Headache).) 30 tablet 1   diazepam (VALIUM) 5 MG tablet TAKE AS NEEDED FOR HEADACHES, NO MORE THAN 3 IN A DAY (Patient taking differently: Take 5 mg by mouth daily as needed (Headache).) 30 tablet 5   meclizine (ANTIVERT) 25 MG tablet Take 1 tablet (25 mg total) by mouth 3 (three) times daily as needed for dizziness. 30 tablet 0   Multiple Vitamins-Minerals (BARIATRIC MULTIVITAMINS/IRON) CAPS Take by mouth.     pantoprazole (PROTONIX) 40 MG tablet Take 1 tablet (40 mg total) by mouth daily. 90 tablet 0   valACYclovir (VALTREX) 500 MG tablet Take 1 tablet (500 mg total) by mouth daily. Can increase to twice a day for 5 days in the event of a recurrence (Patient taking differently: Take 500 mg by mouth daily as needed (flair up). Can increase to twice a day for 5 days in the event of a recurrence) 30 tablet 12   No current facility-administered medications on file prior to visit.    Allergies  Allergen Reactions   Ibuprofen     Other reaction(s): Other  (See Comments) GI Upset   Lodine [Etodolac] Hives and Other (See Comments)    GI upset     Review of Systems Constitutional: negative for chills, fatigue, fevers and sweats Eyes: negative for irritation, redness and visual disturbance Ears, nose, mouth,  throat, and face: negative for hearing loss, nasal congestion, snoring and tinnitus Respiratory: negative for asthma, cough, sputum Cardiovascular: negative for chest pain, dyspnea, exertional chest pressure/discomfort, irregular heart beat, palpitations and syncope Gastrointestinal: negative for abdominal pain, change in bowel habits, nausea and vomiting Genitourinary: negative for abnormal menstrual periods, genital lesions, sexual problems and vaginal discharge, dysuria and urinary incontinence Integument/breast: negative for breast lump, breast tenderness and nipple discharge Hematologic/lymphatic: negative for bleeding and easy bruising Musculoskeletal:negative for back pain and muscle weakness Neurological: negative for dizziness, headaches, vertigo and weakness Endocrine: negative for diabetic symptoms including polydipsia, polyuria and skin dryness Allergic/Immunologic: negative for hay fever and urticaria      Objective:  Blood pressure 132/79, pulse 82, height 5\' 1"  (1.549 m), weight 218 lb 6.4 oz (99.1 kg).  Body mass index is 41.27 kg/m.  General Appearance:    Alert, cooperative, no distress, appears stated age, morbid obesity  Head:    Normocephalic, without obvious abnormality, atraumatic  Eyes:    PERRL, conjunctiva/corneas clear, EOM's intact, both eyes  Ears:    Normal external ear canals, both ears  Nose:   Nares normal, septum midline, mucosa normal, no drainage or sinus tenderness  Throat:   Lips, mucosa, and tongue normal; teeth and gums normal  Neck:   Supple, symmetrical, trachea midline, no adenopathy; thyroid: no enlargement/tenderness/nodules; no carotid bruit or JVD  Back:     Symmetric, no curvature, ROM  normal, no CVA tenderness  Lungs:     Clear to auscultation bilaterally, respirations unlabored  Chest Wall:    No tenderness or deformity   Heart:    Regular rate and rhythm, S1 and S2 normal, no murmur, rub or gallop  Breast Exam:    No tenderness, masses, or nipple abnormality  Abdomen:     Soft, non-tender, bowel sounds active all four quadrants, no masses, no organomegaly.    Genitalia:    Pelvic:external genitalia normal, vagina without lesions, discharge, or tenderness, rectovaginal septum  normal. Cervix normal in appearance, no cervical motion tenderness, no adnexal masses or tenderness.  Uterus normal size, shape, mobile, regular contours, nontender.  Rectal:    Normal external sphincter.  No hemorrhoids appreciated. Internal exam not done.   Extremities:   Extremities normal, atraumatic, no cyanosis or edema  Pulses:   2+ and symmetric all extremities  Skin:   Skin color, texture, turgor normal, no rashes or lesions  Lymph nodes:   Cervical, supraclavicular, and axillary nodes normal  Neurologic:   CNII-XII intact, normal strength, sensation and reflexes throughout   .  Labs:  Lab Results  Component Value Date   WBC 7.5 04/28/2021   HGB 15.8 (H) 04/28/2021   HCT 45.9 04/28/2021   MCV 84.8 04/28/2021   PLT 240 04/28/2021    Lab Results  Component Value Date   CREATININE 0.70 04/28/2021   BUN 18 04/28/2021   NA 137 04/28/2021   K 4.7 04/28/2021   CL 106 04/28/2021   CO2 26 04/28/2021    Lab Results  Component Value Date   ALT 58 (H) 09/02/2020   AST 37 09/02/2020   ALKPHOS 60 09/02/2020   BILITOT 0.6 09/02/2020    Lab Results  Component Value Date   TSH 1.170 06/19/2020     Assessment:   1. Well woman exam   2. Encounter for screening mammogram for malignant neoplasm of breast   3. S/P endometrial ablation   4. Benign essential HTN   5. S/P laparoscopic sleeve  gastrectomy      Plan:  Blood tests: None ordered. Plans to f/u with PCP in 1 month.   Breast self exam technique reviewed and patient encouraged to perform self-exam monthly. Contraception: vasectomy. Discussed healthy lifestyle modifications.  Congratulated patient on weight loss efforts thus far after bariatric surgery.  Mammogram  up to date Pap smear up to date. COVID vaccination status: Patient has completed 2 dose series (Moderna), is due for booster.  Discussed that spotting after ablation may be due to rapid weight changes.  Continue to monitor.  If heavy abnormal bleeding occurs, to follow up.  Hypertension improved with weight loss, no longer on medication.   Follow up in 1 year for annual exam   Hildred LaserAnika Monterius Rolf, MD [facility]

## 2021-06-04 NOTE — Patient Instructions (Signed)
Breast Self-Awareness Breast self-awareness is knowing how your breasts look and feel. Doing breast self-awareness is important. It allows you to catch a breast problem early while it is still small and can be treated. All women should do breast self-awareness, including women who have had breast implants. Tell your doctorif you notice a change in your breasts. What you need: A mirror. A well-lit room. How to do a breast self-exam A breast self-exam is one way to learn what is normal for your breasts and tocheck for changes. To do a breast self-exam: Look for changes  Take off all the clothes above your waist. Stand in front of a mirror in a room with good lighting. Put your hands on your hips. Push your hands down. Look at your breasts and nipples in the mirror to see if one breast or nipple looks different from the other. Check to see if: The shape of one breast is different. The size of one breast is different. There are wrinkles, dips, and bumps in one breast and not the other. Look at each breast for changes in the skin, such as: Redness. Scaly areas. Look for changes in your nipples, such as: Liquid around the nipples. Bleeding. Dimpling. Redness. A change in where the nipples are.  Feel for changes  Lie on your back on the floor. Feel each breast. To do this, follow these steps: Pick a breast to feel. Put the arm closest to that breast above your head. Use your other arm to feel the nipple area of your breast. Feel the area with the pads of your three middle fingers by making small circles with your fingers. For the first circle, press lightly. For the second circle, press harder. For the third circle, press even harder. Keep making circles with your fingers at the different pressures as you move down your breast. Stop when you feel your ribs. Move your fingers a little toward the center of your body. Start making circles with your fingers again, this time going up until  you reach your collarbone. Keep making up-and-down circles until you reach your armpit. Remember to keep using the three pressures. Feel the other breast in the same way. Sit or stand in the tub or shower. With soapy water on your skin, feel each breast the same way you did in step 2 when you were lying on the floor.  Write down what you find Writing down what you find can help you remember what to tell your doctor. Write down: What is normal for each breast. Any changes you find in each breast, including: The kind of changes you find. Whether you have pain. Size and location of any lumps. When you last had your menstrual period. General tips Check your breasts every month. If you are breastfeeding, the best time to check your breasts is after you feed your baby or after you use a breast pump. If you get menstrual periods, the best time to check your breasts is 5-7 days after your menstrual period is over. With time, you will become comfortable with the self-exam, and you will begin to know if there are changes in your breasts. Contact a doctor if you: See a change in the shape or size of your breasts or nipples. See a change in the skin of your breast or nipples, such as red or scaly skin. Have fluid coming from your nipples that is not normal. Find a lump or thick area that was not there before. Have pain in   your breasts. Have any concerns about your breast health. Summary Breast self-awareness includes looking for changes in your breasts, as well as feeling for changes within your breasts. Breast self-awareness should be done in front of a mirror in a well-lit room. You should check your breasts every month. If you get menstrual periods, the best time to check your breasts is 5-7 days after your menstrual period is over. Let your doctor know of any changes you see in your breasts, including changes in size, changes on the skin, pain or tenderness, or fluid from your nipples that is  not normal. This information is not intended to replace advice given to you by your health care provider. Make sure you discuss any questions you have with your healthcare provider. Document Revised: 05/16/2018 Document Reviewed: 05/16/2018 Elsevier Patient Education  2022 Elsevier Inc. Preventive Care 21-39 Years Old, Female Preventive care refers to lifestyle choices and visits with your health care provider that can promote health and wellness. This includes: A yearly physical exam. This is also called an annual wellness visit. Regular dental and eye exams. Immunizations. Screening for certain conditions. Healthy lifestyle choices, such as: Eating a healthy diet. Getting regular exercise. Not using drugs or products that contain nicotine and tobacco. Limiting alcohol use. What can I expect for my preventive care visit? Physical exam Your health care provider may check your: Height and weight. These may be used to calculate your BMI (body mass index). BMI is a measurement that tells if you are at a healthy weight. Heart rate and blood pressure. Body temperature. Skin for abnormal spots. Counseling Your health care provider may ask you questions about your: Past medical problems. Family's medical history. Alcohol, tobacco, and drug use. Emotional well-being. Home life and relationship well-being. Sexual activity. Diet, exercise, and sleep habits. Work and work environment. Access to firearms. Method of birth control. Menstrual cycle. Pregnancy history. What immunizations do I need?  Vaccines are usually given at various ages, according to a schedule. Your health care provider will recommend vaccines for you based on your age, medicalhistory, and lifestyle or other factors, such as travel or where you work. What tests do I need?  Blood tests Lipid and cholesterol levels. These may be checked every 5 years starting at age 20. Hepatitis C test. Hepatitis B  test. Screening Diabetes screening. This is done by checking your blood sugar (glucose) after you have not eaten for a while (fasting). STD (sexually transmitted disease) testing, if you are at risk. BRCA-related cancer screening. This may be done if you have a family history of breast, ovarian, tubal, or peritoneal cancers. Pelvic exam and Pap test. This may be done every 3 years starting at age 21. Starting at age 30, this may be done every 5 years if you have a Pap test in combination with an HPV test. Talk with your health care provider about your test results, treatment options,and if necessary, the need for more tests. Follow these instructions at home: Eating and drinking  Eat a healthy diet that includes fresh fruits and vegetables, whole grains, lean protein, and low-fat dairy products. Take vitamin and mineral supplements as recommended by your health care provider. Do not drink alcohol if: Your health care provider tells you not to drink. You are pregnant, may be pregnant, or are planning to become pregnant. If you drink alcohol: Limit how much you have to 0-1 drink a day. Be aware of how much alcohol is in your drink. In the [place] one   drink equals one 12 oz bottle of beer (355 mL), one 5 oz glass of wine (148 mL), or one 1 oz glass of hard liquor (44 mL).  Lifestyle Take daily care of your teeth and gums. Brush your teeth every morning and night with fluoride toothpaste. Floss one time each day. Stay active. Exercise for at least 30 minutes 5 or more days each week. Do not use any products that contain nicotine or tobacco, such as cigarettes, e-cigarettes, and chewing tobacco. If you need help quitting, ask your health care provider. Do not use drugs. If you are sexually active, practice safe sex. Use a condom or other form of protection to prevent STIs (sexually transmitted infections). If you do not wish to become pregnant, use a form of birth control. If you plan to become  pregnant, see your health care provider for a prepregnancy visit. Find healthy ways to cope with stress, such as: Meditation, yoga, or listening to music. Journaling. Talking to a trusted person. Spending time with friends and family. Safety Always wear your seat belt while driving or riding in a vehicle. Do not drive: If you have been drinking alcohol. Do not ride with someone who has been drinking. When you are tired or distracted. While texting. Wear a helmet and other protective equipment during sports activities. If you have firearms in your house, make sure you follow all gun safety procedures. Seek help if you have been physically or sexually abused. What's next? Go to your health care provider once a year for an annual wellness visit. Ask your health care provider how often you should have your eyes and teeth checked. Stay up to date on all vaccines. This information is not intended to replace advice given to you by your health care provider. Make sure you discuss any questions you have with your healthcare provider. Document Revised: 05/25/2020 Document Reviewed: 06/08/2018 Elsevier Patient Education  2022 Elsevier Inc.  

## 2021-06-06 ENCOUNTER — Encounter: Payer: Self-pay | Admitting: Obstetrics and Gynecology

## 2021-07-01 ENCOUNTER — Encounter: Payer: Self-pay | Admitting: Family Medicine

## 2021-07-01 ENCOUNTER — Other Ambulatory Visit: Payer: Self-pay

## 2021-07-01 ENCOUNTER — Ambulatory Visit (INDEPENDENT_AMBULATORY_CARE_PROVIDER_SITE_OTHER): Payer: BC Managed Care – PPO | Admitting: Family Medicine

## 2021-07-01 VITALS — BP 114/56 | HR 69 | Temp 98.2°F | Ht 61.0 in | Wt 219.7 lb

## 2021-07-01 DIAGNOSIS — Z23 Encounter for immunization: Secondary | ICD-10-CM

## 2021-07-01 DIAGNOSIS — F339 Major depressive disorder, recurrent, unspecified: Secondary | ICD-10-CM | POA: Diagnosis not present

## 2021-07-01 DIAGNOSIS — Z9884 Bariatric surgery status: Secondary | ICD-10-CM | POA: Diagnosis not present

## 2021-07-01 DIAGNOSIS — K219 Gastro-esophageal reflux disease without esophagitis: Secondary | ICD-10-CM

## 2021-07-01 DIAGNOSIS — Z0000 Encounter for general adult medical examination without abnormal findings: Secondary | ICD-10-CM

## 2021-07-01 DIAGNOSIS — E1165 Type 2 diabetes mellitus with hyperglycemia: Secondary | ICD-10-CM | POA: Diagnosis not present

## 2021-07-01 DIAGNOSIS — G4733 Obstructive sleep apnea (adult) (pediatric): Secondary | ICD-10-CM

## 2021-07-01 DIAGNOSIS — E782 Mixed hyperlipidemia: Secondary | ICD-10-CM

## 2021-07-01 DIAGNOSIS — G4710 Hypersomnia, unspecified: Secondary | ICD-10-CM

## 2021-07-01 LAB — POCT GLYCOSYLATED HEMOGLOBIN (HGB A1C): Hemoglobin A1C: 5.5 % (ref 4.0–5.6)

## 2021-07-01 MED ORDER — CITALOPRAM HYDROBROMIDE 40 MG PO TABS: 40.0000 mg | ORAL_TABLET | Freq: Every day | ORAL | 3 refills | Status: DC

## 2021-07-01 MED ORDER — PANTOPRAZOLE SODIUM 40 MG PO TBEC: 40.0000 mg | DELAYED_RELEASE_TABLET | Freq: Every day | ORAL | 3 refills | Status: DC

## 2021-07-01 NOTE — Assessment & Plan Note (Signed)
Determine need for new CPAP settings or no use at all

## 2021-07-01 NOTE — Progress Notes (Signed)
Complete physical exam   Patient: Julia Lynch   DOB: 25-Apr-1976   45 y.o. Female  MRN: [id number] Visit Date: 07/01/2021  Today's healthcare provider: Jacky Kindle, FNP   Chief Complaint  Patient presents with   Annual Exam   Subjective    Julia Lynch is a 45 y.o. female who presents today for a complete physical exam.  She reports consuming a general diet.  Patient tries to go walking daily for at least 30 mins  She generally feels well. She reports sleeping well. She does have additional problems to discuss today.   Has started going back to gym to begin weight training; hard to do something for herself. HPI  Has stopped using Cpap since surgery because it was causing headaches. Husband also made her aware she has not been snoring as much since surgery. Wants to know is she needs another sleep study to determine if she still has sleep apnea -Diabetic Eye Exam last May or June 2021 Patient will try to schedule one soon and get records sent over following.  Recent dental cleaning.  Past Medical History:  Diagnosis Date   Anxiety    Arthritis    Asthma 2006   h/o    Asthma 09/26/2015   Benign essential HTN 10/17/2015   Depression    Diabetic acidosis, type II (HCC)    GERD (gastroesophageal reflux disease)    Headache    History of chicken pox    History of gestational diabetes 01/13/2009   Insulin requiring during pregnancies   History of kidney stones    h/o   History of postpartum depression 05/20/2017   Hypertension    PCOS (polycystic ovarian syndrome)    Polycystic ovaries 10/11/1998   Sleep apnea    uses cpap   Vitamin D deficiency disease    Past Surgical History:  Procedure Laterality Date   ENDOMETRIAL ABLATION Bilateral 03/07/2020   Procedure: ENDOMETRIAL ABLATION;  Surgeon: Hildred Laser, MD;  Location: [facility];  Service: Gynecology;  Laterality: Bilateral;   HYSTEROSCOPY WITH D & C Bilateral 03/07/2020   Procedure: DILATATION AND  CURETTAGE /HYSTEROSCOPY;  Surgeon: Hildred Laser, MD;  Location: [facility];  Service: Gynecology;  Laterality: Bilateral;   left ankle repair  1995   UPPER GI ENDOSCOPY N/A 09/01/2020   Procedure: UPPER GI ENDOSCOPY;  Surgeon: Gaynelle Adu, MD;  Location: [facility];  Service: General;  Laterality: N/A;   Social History   Socioeconomic History   Marital status: Married    Spouse name: Not on file   Number of children: 3   Years of education: Not on file   Highest education level: Not on file  Occupational History   Not on file  Tobacco Use   Smoking status: Former    Packs/day: 0.50    Years: 10.00    Pack years: 5.00    Types: Cigarettes    Quit date: 10/11/2004    Years since quitting: 16.7   Smokeless tobacco: Never  Vaping Use   Vaping Use: Never used  Substance and Sexual Activity   Alcohol use: Not Currently    Alcohol/week: 0.0 standard drinks   Drug use: No   Sexual activity: Not Currently    Comment: husband-vasectomy  Other Topics Concern   Not on file  Social History Narrative   Not on file   Social Determinants of Health   Financial Resource Strain: Not on file  Food Insecurity: Not on file  Transportation  Needs: Not on file  Physical Activity: Not on file  Stress: Not on file  Social Connections: Not on file  Intimate Partner Violence: Not on file   Family Status  Relation Name Status   Father  Alive   Mother  Alive   MGF  Deceased at age 42   PGM  Alive   PGF  Deceased       Lougerrits disease   Family History  Problem Relation Age of Onset   Diabetes Father    Hypertension Father    Hepatitis C Mother    Heart attack Maternal Grandfather    Hypertension Paternal Grandmother    Diabetes Paternal Grandmother    Allergies  Allergen Reactions   Ibuprofen     Other reaction(s): Other (See Comments) GI Upset   Lodine [Etodolac] Hives and Other (See Comments)    GI upset    Patient Care Team: Jacky Kindle, FNP as PCP - General (Family  Medicine) Purcell Nails, CNM as Midwife (Obstetrics and Gynecology) [place], [facility]   Medications: Outpatient Medications Prior to Visit  Medication Sig   CALCIUM CITRATE PO Take 1 each by mouth in the morning, at noon, and at bedtime. Fusion chews 500mg    cyclobenzaprine (FLEXERIL) 5 MG tablet Take 1 tablet (5 mg total) by mouth 3 (three) times daily as needed for muscle spasms. (Patient taking differently: Take 5 mg by mouth 3 (three) times daily as needed (Headache).)   diazepam (VALIUM) 5 MG tablet TAKE AS NEEDED FOR HEADACHES, NO MORE THAN 3 IN A DAY (Patient taking differently: Take 5 mg by mouth daily as needed (Headache).)   meclizine (ANTIVERT) 25 MG tablet Take 1 tablet (25 mg total) by mouth 3 (three) times daily as needed for dizziness.   Multiple Vitamins-Minerals (BARIATRIC MULTIVITAMINS/IRON) CAPS Take by mouth.   valACYclovir (VALTREX) 500 MG tablet Take 1 tablet (500 mg total) by mouth daily. Can increase to twice a day for 5 days in the event of a recurrence (Patient taking differently: Take 500 mg by mouth daily as needed (flair up). Can increase to twice a day for 5 days in the event of a recurrence)   [DISCONTINUED] citalopram (CELEXA) 40 MG tablet TAKE 1 TABLET BY MOUTH EVERY DAY   [DISCONTINUED] pantoprazole (PROTONIX) 40 MG tablet Take 1 tablet (40 mg total) by mouth daily.   No facility-administered medications prior to visit.    Review of Systems    Objective    BP (!) 114/56   Pulse 69   Temp 98.2 F (36.8 C) (Oral)   Ht 5\' 1"  (1.549 m)   Wt 219 lb 11.2 oz (99.7 kg)   BMI 41.51 kg/m    Physical Exam Vitals and nursing note reviewed.  Constitutional:      General: She is awake. She is not in acute distress.    Appearance: Normal appearance. She is well-developed, well-groomed and normal weight. She is not ill-appearing, toxic-appearing or diaphoretic.  HENT:     Head: Normocephalic and atraumatic.     Jaw: There is normal jaw  occlusion. No trismus, tenderness, swelling or pain on movement.     Right Ear: Hearing, tympanic membrane, ear canal and external ear normal. There is no impacted cerumen.     Left Ear: Hearing, tympanic membrane, ear canal and external ear normal. There is no impacted cerumen.     Nose: Nose normal. No congestion or rhinorrhea.     Right Turbinates: Not enlarged,  swollen or pale.     Left Turbinates: Not enlarged, swollen or pale.     Right Sinus: No maxillary sinus tenderness or frontal sinus tenderness.     Left Sinus: No maxillary sinus tenderness or frontal sinus tenderness.     Mouth/Throat:     Lips: Pink.     Mouth: Mucous membranes are moist. No injury.     Tongue: No lesions.     Pharynx: Oropharynx is clear. Uvula midline. No pharyngeal swelling, oropharyngeal exudate, posterior oropharyngeal erythema or uvula swelling.     Tonsils: No tonsillar exudate or tonsillar abscesses.  Eyes:     General: Lids are normal. Lids are everted, no foreign bodies appreciated. Vision grossly intact. Gaze aligned appropriately. No allergic shiner or visual field deficit.       Right eye: No discharge.        Left eye: No discharge.     Extraocular Movements: Extraocular movements intact.     Conjunctiva/sclera: Conjunctivae normal.     Right eye: Right conjunctiva is not injected. No exudate.    Left eye: Left conjunctiva is not injected. No exudate.    Pupils: Pupils are equal, round, and reactive to light.  Neck:     Thyroid: No thyroid mass, thyromegaly or thyroid tenderness.     Vascular: No carotid bruit.     Trachea: Trachea normal.  Cardiovascular:     Rate and Rhythm: Normal rate and regular rhythm.     Pulses: Normal pulses.          Carotid pulses are 2+ on the right side and 2+ on the left side.      Radial pulses are 2+ on the right side and 2+ on the left side.       Dorsalis pedis pulses are 2+ on the right side and 2+ on the left side.       Posterior tibial pulses are 2+  on the right side and 2+ on the left side.     Heart sounds: Normal heart sounds, S1 normal and S2 normal. No murmur heard.   No friction rub. No gallop.  Pulmonary:     Effort: Pulmonary effort is normal. No respiratory distress.     Breath sounds: Normal breath sounds and air entry. No stridor. No wheezing, rhonchi or rales.  Chest:     Chest wall: No tenderness.     Comments: Breast exam deferred; discussed 'know your lemons' campaign and self exam Abdominal:     General: Abdomen is flat. Bowel sounds are normal. There is no distension.     Palpations: Abdomen is soft. There is no mass.     Tenderness: There is no abdominal tenderness. There is no right CVA tenderness, left CVA tenderness, guarding or rebound.     Hernia: No hernia is present.  Genitourinary:    Comments: Exam deferred; denies complaints Musculoskeletal:        General: No swelling, tenderness, deformity or signs of injury. Normal range of motion.     Cervical back: Full passive range of motion without pain, normal range of motion and neck supple. No edema, rigidity or tenderness. No muscular tenderness.     Right lower leg: No edema.     Left lower leg: No edema.  Lymphadenopathy:     Cervical: No cervical adenopathy.     Right cervical: No superficial, deep or posterior cervical adenopathy.    Left cervical: No superficial, deep or posterior cervical adenopathy.  Skin:  General: Skin is warm and dry.     Capillary Refill: Capillary refill takes less than 2 seconds.     Coloration: Skin is not jaundiced or pale.     Findings: No bruising, erythema, lesion or rash.  Neurological:     General: No focal deficit present.     Mental Status: She is alert and oriented to person, place, and time. Mental status is at baseline.     GCS: GCS eye subscore is 4. GCS verbal subscore is 5. GCS motor subscore is 6.     Sensory: Sensation is intact. No sensory deficit.     Motor: Motor function is intact. No weakness.      Coordination: Coordination is intact. Coordination normal.     Gait: Gait is intact. Gait normal.  Psychiatric:        Attention and Perception: Attention and perception normal.        Mood and Affect: Mood and affect normal.        Speech: Speech normal.        Behavior: Behavior normal. Behavior is cooperative.        Thought Content: Thought content normal.        Cognition and Memory: Cognition and memory normal.        Judgment: Judgment normal.    Last depression screening scores PHQ 2/9 Scores 07/01/2021 12/24/2020 07/25/2020  PHQ - 2 Score 1 0 2  PHQ- 9 Score 2 2 6    Last fall risk screening Fall Risk  07/01/2021  Falls in the past year? 1  Comment -  Number falls in past yr: 1  Comment -  Injury with Fall? 0  Comment -  Risk for fall due to : History of fall(s)  Follow up -   Last Audit-C alcohol use screening Alcohol Use Disorder Test (AUDIT) 07/01/2021  1. How often do you have a drink containing alcohol? 0  2. How many drinks containing alcohol do you have on a typical day when you are drinking? 0  3. How often do you have six or more drinks on one occasion? 0  AUDIT-C Score 0  Alcohol Brief Interventions/Follow-up -   A score of 3 or more in women, and 4 or more in men indicates increased risk for alcohol abuse, EXCEPT if all of the points are from question 1   Results for orders placed or performed in visit on 07/01/21  POCT HgB A1C  Result Value Ref Range   Hemoglobin A1C 5.5 4.0 - 5.6 %    Assessment & Plan    Routine Health Maintenance and Physical Exam  Exercise Activities and Dietary recommendations  Goals   None     Immunization History  Administered Date(s) Administered   Influenza,inj,Quad PF,6+ Mos 09/26/2015, 08/20/2016, 10/13/2018, 06/18/2020, 07/01/2021   Moderna Sars-Covid-2 Vaccination 01/18/2020, 02/21/2020   Tdap 04/20/2012, 07/08/2014    Health Maintenance  Topic Date Due   OPHTHALMOLOGY EXAM  12/09/2019   FOOT EXAM  12/15/2019    COVID-19 Vaccine (3 - Booster for Moderna series) 07/23/2020   URINE MICROALBUMIN  06/18/2021   PAP SMEAR-Modifier  10/13/2021   HEMOGLOBIN A1C  12/29/2021   MAMMOGRAM  02/02/2022   TETANUS/TDAP  07/08/2024   INFLUENZA VACCINE  Completed   Hepatitis C Screening  Completed   HIV Screening  Completed   HPV VACCINES  Aged Out    Discussed health benefits of physical activity, and encouraged her to engage in regular exercise appropriate for her  age and condition.  Problem List Items Addressed This Visit       Respiratory   Obstructive sleep apnea    Wants repeat sleep study No longer snores Stopped wearing CPAP d/t HAs        Digestive   Esophageal reflux    On PPI s/p lapband sx; refill sent      Relevant Medications   pantoprazole (PROTONIX) 40 MG tablet     Endocrine   Diabetes mellitus (HCC)    Normal A1c S/p lapband sx      Relevant Medications   pantoprazole (PROTONIX) 40 MG tablet   Other Relevant Orders   POCT HgB A1C (Completed)   Microalbumin, urine     Other   Hypersomnia    Determine need for new CPAP settings or no use at all      Relevant Orders   Ambulatory referral to Sleep Studies   Hypercholesterolemia with hypertriglyceridemia    Repeat lab work today Start statin if remains elevated, LDL      S/P laparoscopic sleeve gastrectomy    Happy she had the procedure Has started eating some carbs Happy to not have aches/pains that she had Happy to be off some meds      Other Visit Diagnoses     Annual physical exam    -  Primary   Relevant Orders   CBC with Differential/Platelet   Comprehensive metabolic panel   Magnesium   T4, free   TSH   Vitamin B12   VITAMIN D 25 Hydroxy (Vit-D Deficiency, Fractures)   Microalbumin, urine   Lipid panel   Needs flu shot       Relevant Orders   Flu Vaccine QUAD 6+ mos PF IM (Fluarix Quad PF) (Completed)   Depression, recurrent (HCC)       Relevant Medications   citalopram (CELEXA) 40 MG  tablet        Return in about 1 year (around 07/01/2022) for annual examination.     Leilani Merl, FNP, have reviewed all documentation for this visit. The documentation on 07/01/21 for the exam, diagnosis, procedures, and orders are all accurate and complete.    Jacky Kindle, FNP  [facility] [phone] (phone) [phone] (fax)  [facility]

## 2021-07-01 NOTE — Assessment & Plan Note (Signed)
Repeat lab work today Start statin if remains elevated, LDL

## 2021-07-01 NOTE — Assessment & Plan Note (Signed)
On PPI s/p lapband sx; refill sent

## 2021-07-01 NOTE — Assessment & Plan Note (Signed)
Wants repeat sleep study No longer snores Stopped wearing CPAP d/t HAs

## 2021-07-01 NOTE — Assessment & Plan Note (Signed)
Happy she had the procedure Has started eating some carbs Happy to not have aches/pains that she had Happy to be off some meds

## 2021-07-01 NOTE — Assessment & Plan Note (Signed)
Normal A1c S/p lapband sx

## 2021-07-02 LAB — COMPREHENSIVE METABOLIC PANEL
ALT: 22 IU/L (ref 0–32)
AST: 19 IU/L (ref 0–40)
Albumin/Globulin Ratio: 1.8 (ref 1.2–2.2)
Albumin: 4.1 g/dL (ref 3.8–4.8)
Alkaline Phosphatase: 70 IU/L (ref 44–121)
BUN/Creatinine Ratio: 21 (ref 9–23)
BUN: 16 mg/dL (ref 6–24)
Bilirubin Total: 0.3 mg/dL (ref 0.0–1.2)
CO2: 25 mmol/L (ref 20–29)
Calcium: 9 mg/dL (ref 8.7–10.2)
Chloride: 103 mmol/L (ref 96–106)
Creatinine, Ser: 0.75 mg/dL (ref 0.57–1.00)
Globulin, Total: 2.3 g/dL (ref 1.5–4.5)
Glucose: 88 mg/dL (ref 65–99)
Potassium: 4.6 mmol/L (ref 3.5–5.2)
Sodium: 140 mmol/L (ref 134–144)
Total Protein: 6.4 g/dL (ref 6.0–8.5)
eGFR: 101 mL/min/{1.73_m2} (ref >59)

## 2021-07-02 LAB — VITAMIN D 25 HYDROXY (VIT D DEFICIENCY, FRACTURES): Vit D, 25-Hydroxy: 71.9 ng/mL (ref 30.0–100.0)

## 2021-07-02 LAB — CBC WITH DIFFERENTIAL/PLATELET
Basophils Absolute: 0 10*3/uL (ref 0.0–0.2)
Basos: 1 %
EOS (ABSOLUTE): 0.1 10*3/uL (ref 0.0–0.4)
Eos: 1 %
Hematocrit: 43.8 % (ref 34.0–46.6)
Hemoglobin: 14.7 g/dL (ref 11.1–15.9)
Immature Grans (Abs): 0 10*3/uL (ref 0.0–0.1)
Immature Granulocytes: 0 %
Lymphocytes Absolute: 3.1 10*3/uL (ref 0.7–3.1)
Lymphs: 44 %
MCH: 29.8 pg (ref 26.6–33.0)
MCHC: 33.6 g/dL (ref 31.5–35.7)
MCV: 89 fL (ref 79–97)
Monocytes Absolute: 0.4 10*3/uL (ref 0.1–0.9)
Monocytes: 6 %
Neutrophils Absolute: 3.3 10*3/uL (ref 1.4–7.0)
Neutrophils: 48 %
Platelets: 230 10*3/uL (ref 150–450)
RBC: 4.93 x10E6/uL (ref 3.77–5.28)
RDW: 13.7 % (ref 11.7–15.4)
WBC: 6.9 10*3/uL (ref 3.4–10.8)

## 2021-07-02 LAB — TSH: TSH: 0.742 u[IU]/mL (ref 0.450–4.500)

## 2021-07-02 LAB — MAGNESIUM: Magnesium: 2.1 mg/dL (ref 1.6–2.3)

## 2021-07-02 LAB — VITAMIN B12: Vitamin B-12: 1070 pg/mL (ref 232–1245)

## 2021-07-02 LAB — T4, FREE: Free T4: 1.09 ng/dL (ref 0.82–1.77)

## 2021-07-02 LAB — MICROALBUMIN, URINE: Microalbumin, Urine: 5.2 ug/mL

## 2021-09-09 ENCOUNTER — Other Ambulatory Visit: Payer: Self-pay

## 2021-09-09 ENCOUNTER — Encounter: Payer: Self-pay | Admitting: Dietician

## 2021-09-09 ENCOUNTER — Encounter: Payer: BC Managed Care – PPO | Attending: Family Medicine | Admitting: Dietician

## 2021-09-09 VITALS — Ht 61.0 in | Wt 219.5 lb

## 2021-09-09 DIAGNOSIS — Z6841 Body Mass Index (BMI) 40.0 and over, adult: Secondary | ICD-10-CM

## 2021-09-09 DIAGNOSIS — E1165 Type 2 diabetes mellitus with hyperglycemia: Secondary | ICD-10-CM | POA: Diagnosis not present

## 2021-09-09 NOTE — Patient Instructions (Signed)
Make sure to eat a protein food or drink before eating other food groups with meals.  Resume some gym workouts 2 or more days each week. Track food intake and portions for one or more weeks and adjust calories as needed. Take steps to manage food cravings such as distracting thoughts by doing another task.

## 2021-09-09 NOTE — Progress Notes (Signed)
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  Anthropometrics:  Date 07/11/20 09/16/20 10/29/20 01/28/21 06/03/21 09/09/21  BMI 55.17 48.6 48.1 43.8 41.3 41.4  Weight (lbs) 292.1 267.6 254.1 231.6 218.1 219.5  Skeletal muscle (lbs)    --- 29.6 29.5 29.3 60.2 60.4  % body fat    --- 48.6 47.5 45.4 50.1 50.4   Clinical: Medications: citalopram, diazepam prn, meclizine prn, pantoprazole, valACYclovir Supplementation: bariatric multivitamin 2x daily; calcium 3x daily Health/ medical history changes: improved BP and sleep apnea GI symptoms: N/V/D/C: no; occasional heartburn Dumping Syndrome: one episode after eating milkshake (birthday treat) Hair loss:   Dietary/ Lifestyle Progress: Patient reports accomplishing goal of improved sleep apnea and blood pressure, but voices frustration with little to no weight loss for the past several months. Reports frequent hunger symptoms recently, feels like she did prior to surgery She was working out at gym until having pain in groin -- now resolved, but has not yet started back to the gym.   Dietary recall: Eating pattern: 3 meals and 3 snacks daily Dining out: 0-1 times per week Breakfast: coffee with collagen powder, eggs/ cheese Snack: slice cheese  Lunch: 11/29 potato salad Snack: 11/29 protein shake due to low protein lunch; later ate [place]  Dinner: ate 1/2-2/3c pinto beans + larger portion of sauteed squash and onions; felt hungry 1 hour later Snack: 1pc cheese daily; 11/29 2 handfuls popcorn Beverages: 16-20oz coffee with collagen powder and sugar free sweetener + half and half or sugar free flavored creamer 1-2Tbsp; water through remainder of day; occasionally gatorade zero; occasionally unsweetened tea  Fluid intake: 64+ oz daily Estimated total protein intake: 60g+ daily Bariatric diet adherence:   Using straws: no Drinking fluids during meals: no Carbonated beverages: no  Recent physical activity:  household activity daily, most days does not sit much at all Went to gym every other day for about 2 weeks until she felt a groin pull so has not yet resumed. Roller skating 2x a week   Nutrition Intervention:   Reviewed progress since previous visit. Discussed possible causes of and solutions for weight loss plateau.  Discussed tracking food intake, increasing exercise, managing hunger and food cravings. Advised consuming protein (whether solid food or protein shake) prior to eating any other food, to promote better satiety and avoid overeating starchy foods.   Nutritional Diagnosis:  [place]-3.3 Overweight/obesity As related to history of excess calories and inadequate physical activity.  As evidenced by patient with current BMI of 41.4, following bariatric diet guidelines for ongoing weight loss after bariatric surgery.  Teaching Method Utilized:  Public relations account executive provided: 1000-1200kcal bariatric menus (AND) Bariatric portion plate handout Visit summary with goals/ instructions  Learning Readiness:  Change in progress  Barriers to learning/adherence to lifestyle change: none  Demonstrated degree of understanding via:  Teach Back      Plan: Implement goals as listed in Patient Goals Return for MNT follow up 12/07/21 at 1:15pm

## 2021-09-14 ENCOUNTER — Institutional Professional Consult (permissible substitution): Payer: Self-pay | Admitting: Neurology

## 2021-09-14 ENCOUNTER — Encounter: Payer: Self-pay | Admitting: Family Medicine

## 2021-09-22 ENCOUNTER — Telehealth: Payer: Self-pay

## 2021-09-22 NOTE — Telephone Encounter (Signed)
Copied from CRM [id number]. Topic: General - Other >> Sep 22, 2021  4:03 PM Gaetana Michaelis A wrote: Reason for CRM: The patient has called for an update on previously submitted paperwork for their accommodations at work   Please contact further when possible

## 2021-09-22 NOTE — Telephone Encounter (Signed)
Theres a previous [employer] on this, im going to print out attachment for you to review. KW

## 2021-09-25 ENCOUNTER — Encounter: Payer: Self-pay | Admitting: Family Medicine

## 2021-10-29 ENCOUNTER — Ambulatory Visit: Payer: Self-pay | Admitting: *Deleted

## 2021-10-29 NOTE — Telephone Encounter (Signed)
Summary: pain in her left ear and on the left side of her jaw.   Pt stated that she has pain in her left ear and on the left side of her jaw.  Pt stated that this began two days ago. Pt mentioned after going to the dentist started with pain discomfort.    No appointments available until next week, on Monday or Tuesday.    Seeking clinical advice.     Attempted to call patient-x2. No answer- "call can not be completed at this time' message

## 2021-10-29 NOTE — Telephone Encounter (Signed)
Third attempt to contact patient- same message on patient number- call to emergency contact number- left message to have patient call office.

## 2021-10-29 NOTE — Telephone Encounter (Signed)
Second attempt to reach patient- no answer- same message.

## 2021-12-21 IMAGING — CR DG CHEST 2V
1 series · 2 of 2 positions shown · non-contrast
Comparison: Chest radiograph dated 04/04/2019.

CLINICAL DATA: 44-year-old female with chest pain.

EXAM:
CHEST - 2 VIEW

[Series 1: dg chest 2 view · 0.14mm/px · 2 of 2 slices shown]
[im 1/2]
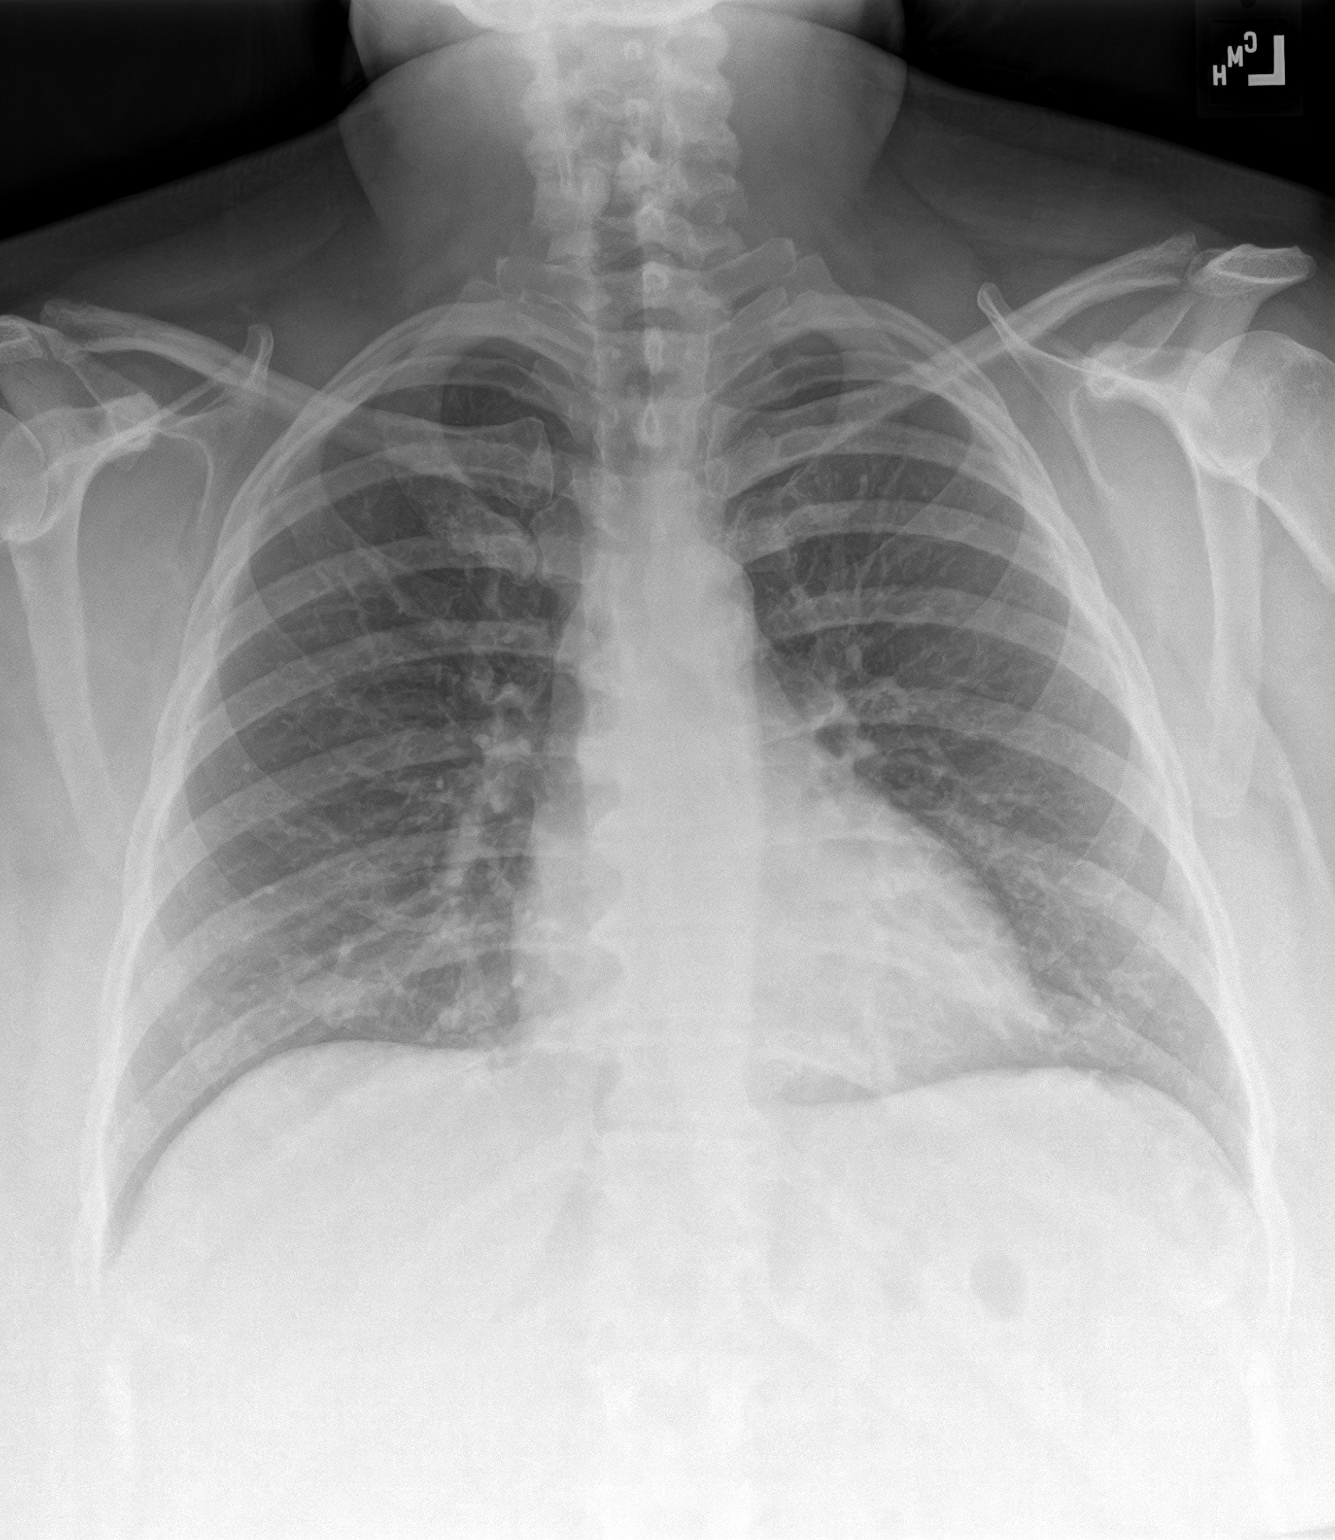
[im 2/2]
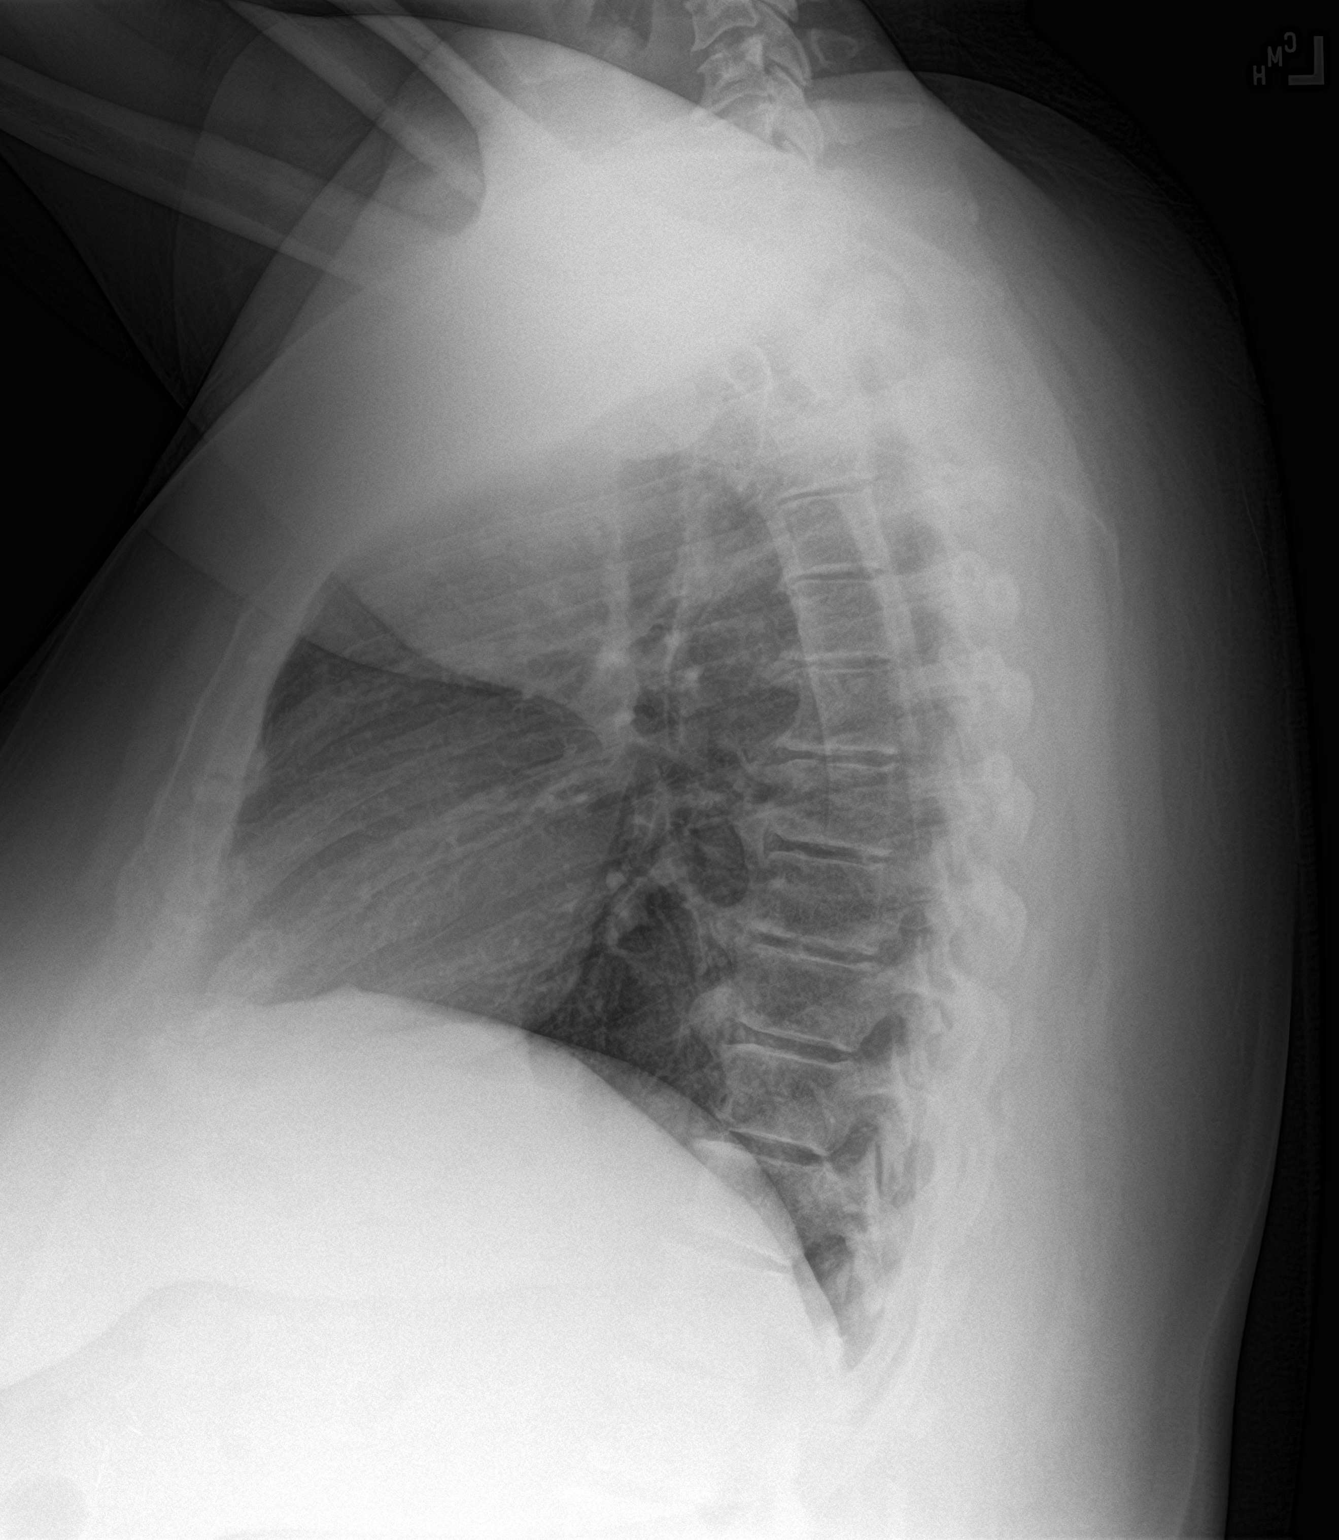

[2 of 2 positions shown; findings below may reference images not displayed]

FINDINGS: The heart size and mediastinal contours are within normal limits.
Both lungs are clear. The visualized skeletal structures are
unremarkable.
IMPRESSION: No active cardiopulmonary disease.

## 2022-02-23 ENCOUNTER — Encounter: Payer: Self-pay | Admitting: Obstetrics and Gynecology

## 2022-02-23 DIAGNOSIS — B009 Herpesviral infection, unspecified: Secondary | ICD-10-CM

## 2022-02-23 MED ORDER — VALACYCLOVIR HCL 500 MG PO TABS: 500.0000 mg | ORAL_TABLET | Freq: Every day | ORAL | 3 refills | Status: DC

## 2022-03-12 IMAGING — MG MM DIGITAL SCREENING BILAT W/ TOMO AND CAD
6 of 10 series · 6 of 30 positions shown · non-contrast
Comparison: Previous exam(s).

CLINICAL DATA: Screening.

EXAM:
DIGITAL SCREENING BILATERAL MAMMOGRAM WITH TOMOSYNTHESIS AND CAD
TECHNIQUE: Bilateral screening digital craniocaudal and mediolateral oblique
mammograms were obtained. Bilateral screening digital breast
tomosynthesis was performed. The images were evaluated with
computer-aided detection.

[L MLO synth-2D]
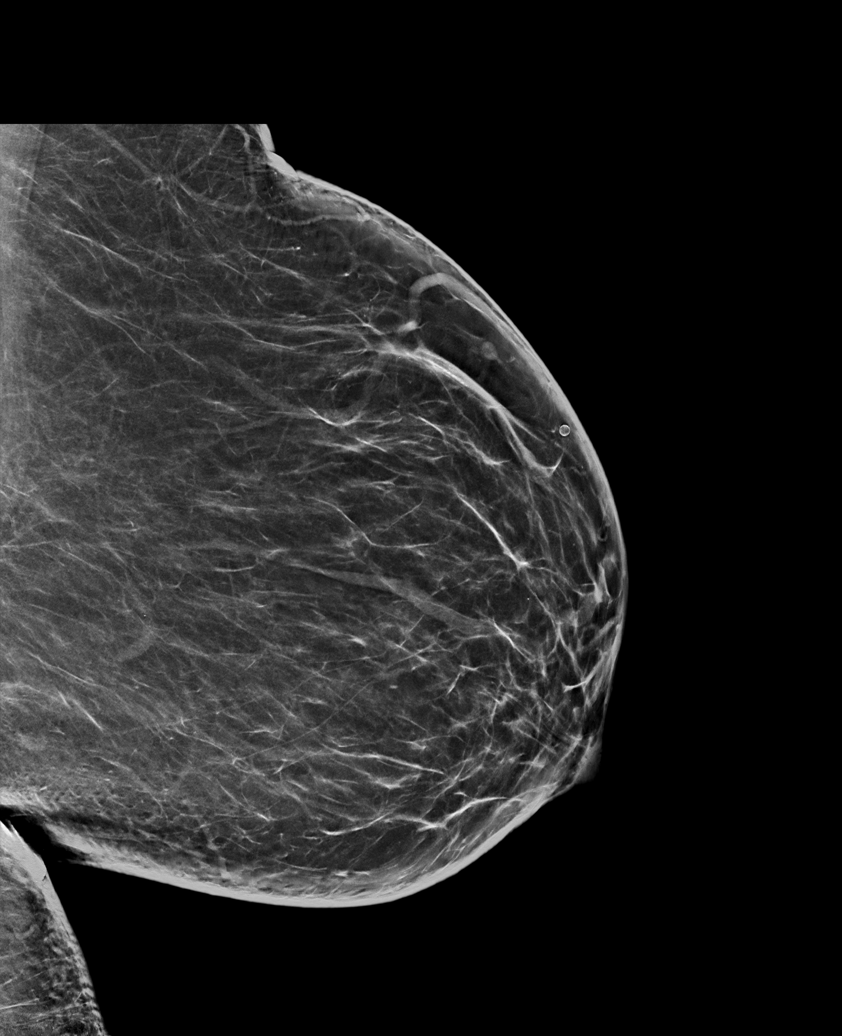

[L CC synth-2D]
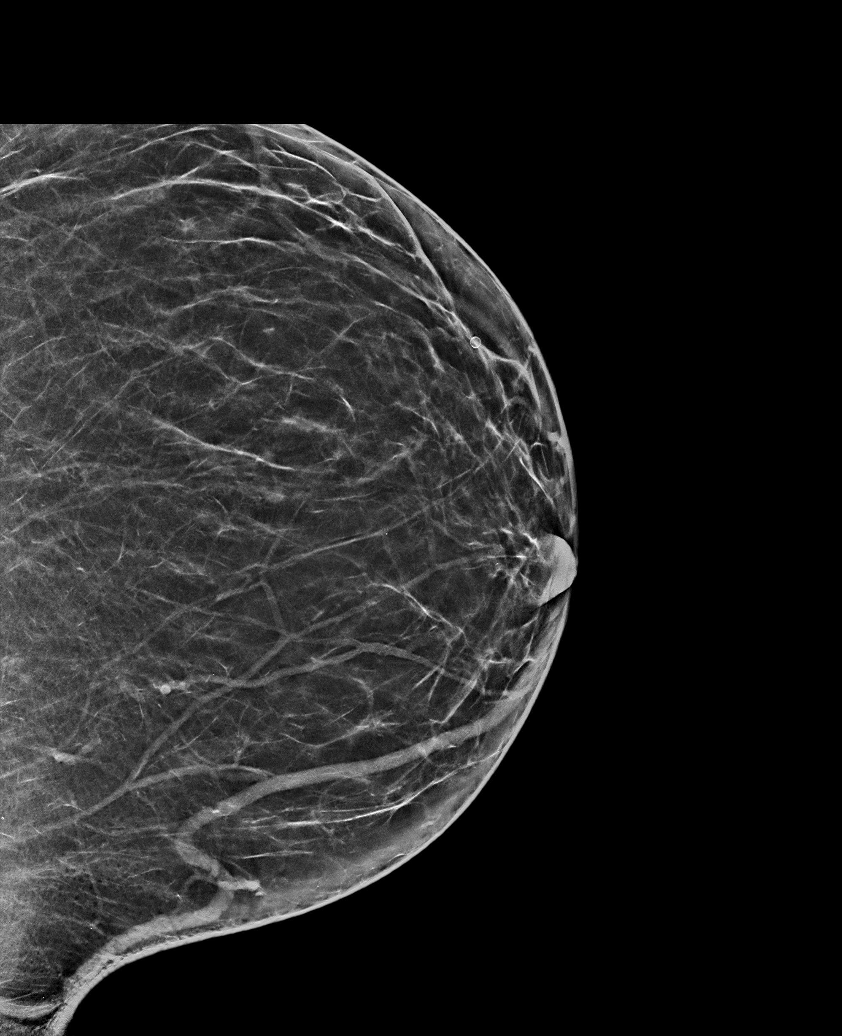

[R CC synth-2D (1 of 2)]
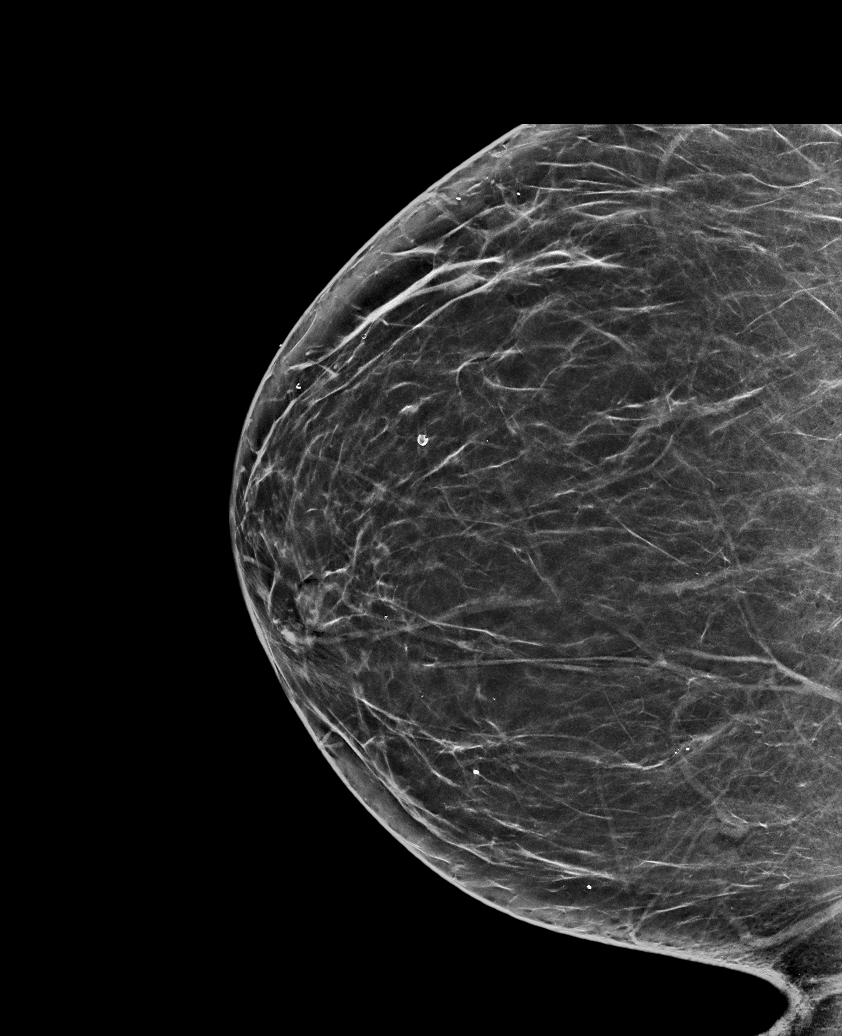

[R CC synth-2D (2 of 2)]
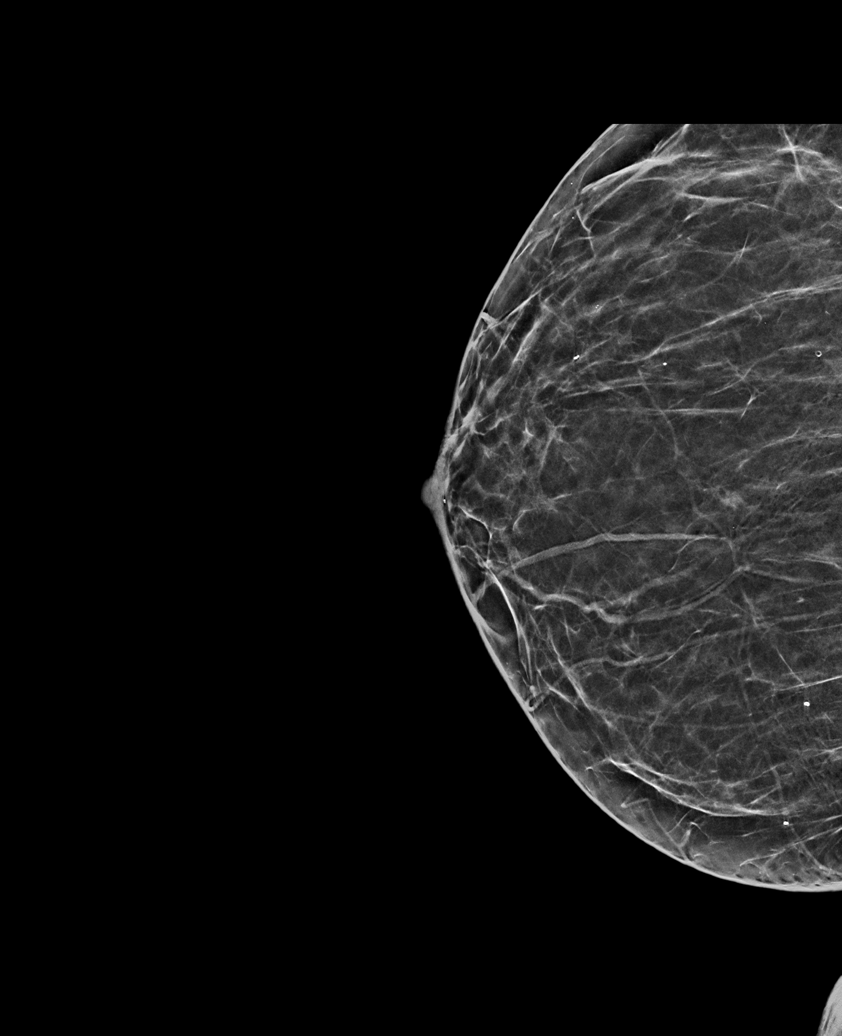

[R MLO synth-2D]
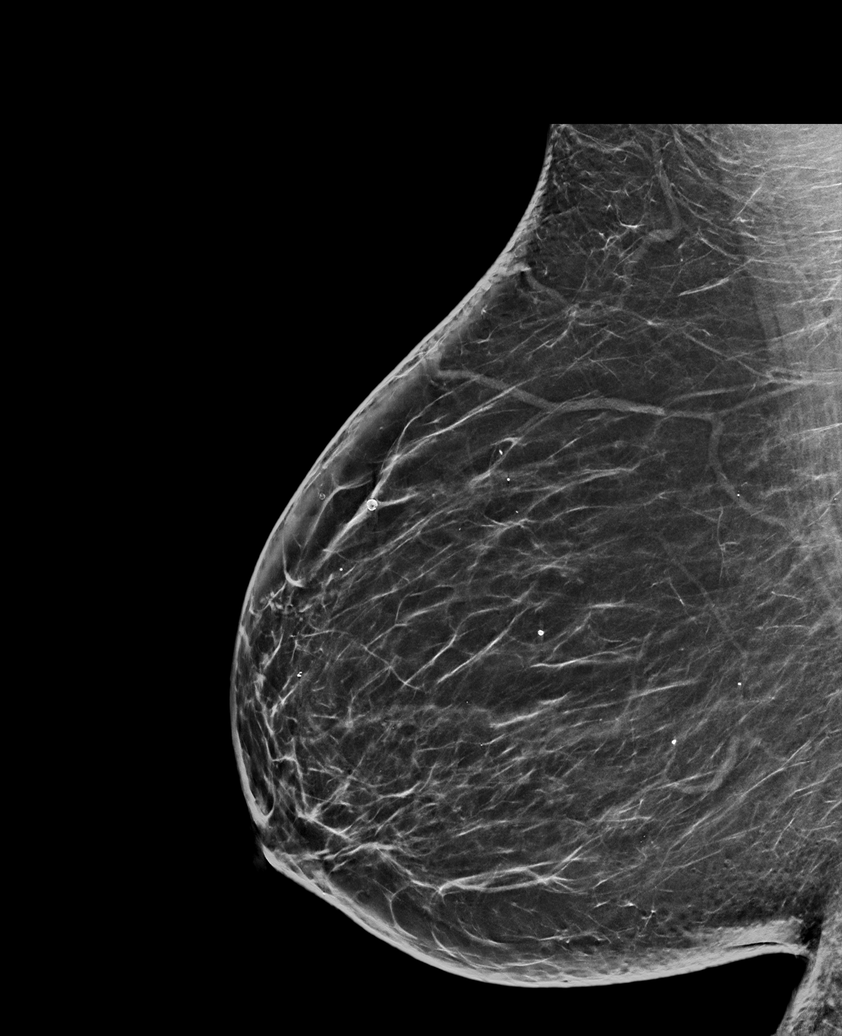

[R MLO tomo · tomo slice 40/79.0]
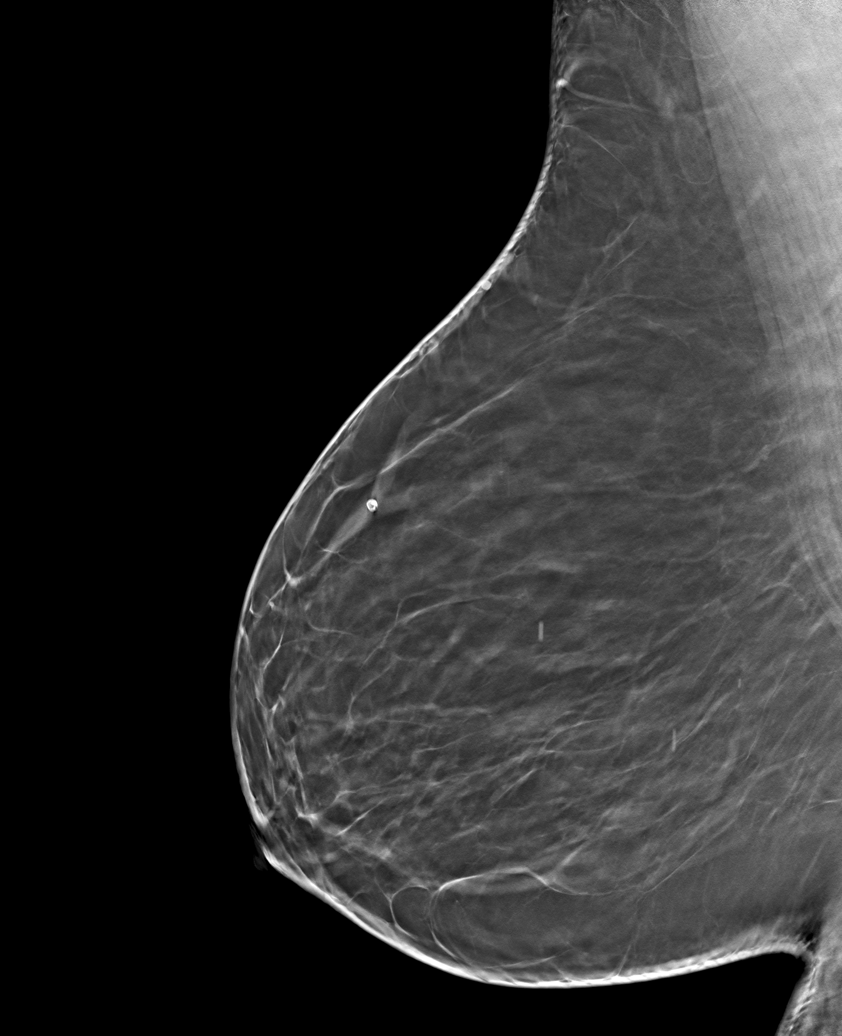

[6 of 30 positions shown; findings below may reference images not displayed]

ACR Breast Density Category b: There are scattered areas of
fibroglandular density.
FINDINGS: There are no findings suspicious for malignancy. The images were
evaluated with computer-aided detection.
IMPRESSION: No mammographic evidence of malignancy. A result letter of this
screening mammogram will be mailed directly to the patient.

RECOMMENDATION:
Screening mammogram in one year. (Code:[id number])

BI-RADS CATEGORY  1: Negative.

## 2022-03-22 ENCOUNTER — Ambulatory Visit: Payer: Self-pay | Admitting: *Deleted

## 2022-03-22 NOTE — Telephone Encounter (Signed)
Reason for Disposition  SEVERE itching (i.e., interferes with sleep, normal activities or school)  Answer Assessment - Initial Assessment Questions 1. APPEARANCE of RASH: "Describe the rash." (e.g., spots, blisters, raised areas, skin peeling, scaly)     Rash on stomach for 2 weeks and now is spreading on hipos and right breast close to armpit and back.   Left breast now has rash, side of stomach right side of back.    Also on hips near thighs.   It itches so bad.  2. 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   I took my medicine for that.   I've not changed anything like soaps, medicine.  It doesn't look like the HPV break out.    3. LOCATION: "Where is the rash located?"     See above I don't have rash in my genital area.   4. COLOR: "What color is the rash?" (Note: It is difficult to assess rash color in people with darker-colored skin. When this situation occurs, simply ask the caller to describe what they see.)     Red raised rash.    Size is smaller than pencil eraser.    5. ONSET: "When did the rash begin?"     2 weeks ago and is spreading. Hydrocortisone, Benadryl makes me so sleepy.   Not helping but for short time.   6. FEVER: "Do you have a fever?" If Yes, ask: "What is your temperature, how was it measured, and when did it start?"     No 7. ITCHING: "Does the rash itch?" If Yes, ask: "How bad is the itch?" (Scale 1-10; or mild, moderate, severe)     Itches real bad 8. CAUSE: "What do you think is causing the rash?"     I don't know 9. MEDICINE FACTORS: "Have you started any new medicines within the last 2 weeks?" (e.g., antibiotics)      No 10. OTHER SYMPTOMS: "Do you have any other symptoms?" (e.g., dizziness, headache, sore throat, joint pain)       No 11. PREGNANCY: "Is there any chance you are pregnant?" "When was your last menstrual period?"       Not  asked  Protocols used: Rash or Redness - Widespread-A-AH  Chief Complaint: rash for 2 wks that is spreading.   It's very itchy  Symptoms: Itching and spreading Frequency: Over last 2 wks   Nothing new in medicines or medications, etc. Pertinent Negatives: Patient denies knowing what could be causing the rash Disposition: [] ED /[] Urgent Care (no appt availability in office) / [x] Appointment(In office/virtual)/ []  [facility]/ [] Home Care/ [] Refused Recommended Disposition /[] [facility]/ []  Follow-up with PCP Additional Notes: Appt made with FNP for 03/22/2022 at 1:00.   Went over care advice to help with the itching.

## 2022-03-22 NOTE — Telephone Encounter (Signed)
Pt has a rash that started on her stomach for two weeks that has now spread to her hips, breast and back / pt wanted an appt for today / please advise

## 2022-03-22 NOTE — Progress Notes (Signed)
Established patient visit   Patient: Julia Lynch   DOB: 1975-10-13   46 y.o. Female  MRN: [id number] Visit Date: 03/23/2022  Today's healthcare provider: Gwyneth Sprout, FNP  Re Introduced to nurse practitioner role and practice setting.  All questions answered.  Discussed provider/patient relationship and expectations.   I,Tiffany J Bragg,acting as a scribe for Gwyneth Sprout, FNP.,have documented all relevant documentation on the behalf of Gwyneth Sprout, FNP,as directed by  Gwyneth Sprout, FNP while in the presence of Gwyneth Sprout, FNP.   Chief Complaint  Patient presents with   Rash    Patient complains of a rash on torso for 2 weeks. States she has no idea what may have caused it.    Subjective    HPI HPI     Rash    Additional comments: Patient complains of a rash on torso for 2 weeks. States she has no idea what may have caused it.       Last edited by Smitty Knudsen, CMA on 03/23/2022  1:11 PM.       Medications: Outpatient Medications Prior to Visit  Medication Sig   CALCIUM CITRATE PO Take 1 each by mouth in the morning, at noon, and at bedtime. Fusion chews 531m   citalopram (CELEXA) 40 MG tablet Take 1 tablet (40 mg total) by mouth daily.   cyclobenzaprine (FLEXERIL) 5 MG tablet Take 1 tablet (5 mg total) by mouth 3 (three) times daily as needed for muscle spasms. (Patient taking differently: Take 5 mg by mouth 3 (three) times daily as needed (Headache).)   diazepam (VALIUM) 5 MG tablet TAKE AS NEEDED FOR HEADACHES, NO MORE THAN 3 IN A DAY (Patient taking differently: Take 5 mg by mouth daily as needed (Headache).)   meclizine (ANTIVERT) 25 MG tablet Take 1 tablet (25 mg total) by mouth 3 (three) times daily as needed for dizziness.   Multiple Vitamins-Minerals (BARIATRIC MULTIVITAMINS/IRON) CAPS Take by mouth.   pantoprazole (PROTONIX) 40 MG tablet Take 1 tablet (40 mg total) by mouth daily.   valACYclovir (VALTREX) 500 MG tablet Take 1 tablet (500  mg total) by mouth daily. Can increase to twice a day for 5 days in the event of a recurrence   No facility-administered medications prior to visit.    Review of Systems  Last CBC Lab Results  Component Value Date   WBC 6.9 07/01/2021   HGB 14.7 07/01/2021   HCT 43.8 07/01/2021   MCV 89 07/01/2021   MCH 29.8 07/01/2021   RDW 13.7 07/01/2021   PLT 230 055/37/4827  Last metabolic panel Lab Results  Component Value Date   GLUCOSE 88 07/01/2021   NA 140 07/01/2021   K 4.6 07/01/2021   CL 103 07/01/2021   CO2 25 07/01/2021   BUN 16 07/01/2021   CREATININE 0.75 07/01/2021   EGFR 101 07/01/2021   CALCIUM 9.0 07/01/2021   PROT 6.4 07/01/2021   ALBUMIN 4.1 07/01/2021   LABGLOB 2.3 07/01/2021   AGRATIO 1.8 07/01/2021   BILITOT 0.3 07/01/2021   ALKPHOS 70 07/01/2021   AST 19 07/01/2021   ALT 22 07/01/2021   ANIONGAP 5 04/28/2021   Last lipids Lab Results  Component Value Date   CHOL 200 (H) 06/19/2020   HDL 37 (L) 06/19/2020   LDLCALC 108 (H) 06/19/2020   TRIG 319 (H) 06/19/2020   CHOLHDL 5.4 (H) 06/19/2020   Last hemoglobin A1c Lab Results  Component Value  Date   HGBA1C 5.5 07/01/2021   Last thyroid functions Lab Results  Component Value Date   TSH 0.742 07/01/2021   T4TOTAL 7.8 07/27/2017   Last vitamin D Lab Results  Component Value Date   VD25OH 71.9 07/01/2021   Last vitamin B12 and Folate Lab Results  Component Value Date   VITAMINB12 1,070 07/01/2021       Objective    BP (!) 141/76 (BP Location: Right Arm, Patient Position: Sitting, Cuff Size: Normal)   Pulse 93   Temp 98.5 F (36.9 C) (Oral)   Resp 16   Ht $R'5\' 1"'WV$  (1.549 m)   Wt 226 lb (102.5 kg)   SpO2 100%   BMI 42.70 kg/m  BP Readings from Last 3 Encounters:  03/23/22 (!) 141/76  07/01/21 (!) 114/56  06/04/21 132/79   Wt Readings from Last 3 Encounters:  03/23/22 226 lb (102.5 kg)  09/09/21 219 lb 8 oz (99.6 kg)  07/01/21 219 lb 11.2 oz (99.7 kg)   SpO2 Readings from Last 3  Encounters:  03/23/22 100%  04/28/21 99%  12/24/20 98%      Physical Exam Vitals and nursing note reviewed.  Constitutional:      General: She is not in acute distress.    Appearance: Normal appearance. She is obese. She is not ill-appearing, toxic-appearing or diaphoretic.  HENT:     Head: Normocephalic and atraumatic.  Cardiovascular:     Rate and Rhythm: Normal rate and regular rhythm.     Pulses: Normal pulses.     Heart sounds: Normal heart sounds. No murmur heard.    No friction rub. No gallop.  Pulmonary:     Effort: Pulmonary effort is normal. No respiratory distress.     Breath sounds: Normal breath sounds. No stridor. No wheezing, rhonchi or rales.  Chest:     Chest wall: No tenderness.  Abdominal:     General: Bowel sounds are normal.     Palpations: Abdomen is soft.  Genitourinary:    Comments: Re-occurrence of HSV lesions; has been using Valtrex PRN Musculoskeletal:        General: No swelling, tenderness, deformity or signs of injury. Normal range of motion.     Right lower leg: No edema.     Left lower leg: No edema.  Skin:    General: Skin is warm and dry.     Capillary Refill: Capillary refill takes less than 2 seconds.     Coloration: Skin is not jaundiced or pale.     Findings: Erythema and rash present. No bruising or lesion.          Comments: itching  Neurological:     General: No focal deficit present.     Mental Status: She is alert and oriented to person, place, and time. Mental status is at baseline.     Cranial Nerves: No cranial nerve deficit.     Sensory: No sensory deficit.     Motor: No weakness.     Coordination: Coordination normal.  Psychiatric:        Mood and Affect: Mood normal.        Behavior: Behavior normal.        Thought Content: Thought content normal.        Judgment: Judgment normal.      No results found for any visits on 03/23/22.  Assessment & Plan     Problem List Items Addressed This Visit        Musculoskeletal and  Integument   Rash and nonspecific skin eruption - Primary    Acute, 2 week hx, generalized itching and discomfort Notes being under a large amount of stress with school and a residential move  Initial symptoms on L mid abdomen Have spread to thigh/pannus folds, L breast, R breast and L back Denies other symptoms Slight improvement with use of Zyrtec Denies new soaps/detergents/products etc Only Rx change was re-start of Valtrex, no previous hx of SE with use       Relevant Medications   clobetasol cream (TEMOVATE) 0.05 %   predniSONE (DELTASONE) 10 MG tablet   hydrOXYzine (VISTARIL) 25 MG capsule     Return in about 2 weeks (around 04/06/2022) for rash.      Vonna Kotyk, FNP, have reviewed all documentation for this visit. The documentation on 03/23/22 for the exam, diagnosis, procedures, and orders are all accurate and complete.    Gwyneth Sprout, [place] [phone] (phone) [phone] (fax)  [place]

## 2022-03-23 ENCOUNTER — Encounter: Payer: Self-pay | Admitting: Family Medicine

## 2022-03-23 ENCOUNTER — Ambulatory Visit (INDEPENDENT_AMBULATORY_CARE_PROVIDER_SITE_OTHER): Payer: BC Managed Care – PPO | Admitting: Family Medicine

## 2022-03-23 VITALS — BP 141/76 | HR 93 | Temp 98.5°F | Resp 16 | Ht 61.0 in | Wt 226.0 lb

## 2022-03-23 DIAGNOSIS — R21 Rash and other nonspecific skin eruption: Secondary | ICD-10-CM | POA: Insufficient documentation

## 2022-03-23 MED ORDER — CLOBETASOL PROPIONATE 0.05 % EX CREA: 1.0000 "application " | TOPICAL_CREAM | Freq: Two times a day (BID) | CUTANEOUS | 1 refills | Status: DC

## 2022-03-23 MED ORDER — HYDROXYZINE PAMOATE 25 MG PO CAPS: ORAL_CAPSULE | ORAL | 0 refills | Status: DC

## 2022-03-23 MED ORDER — PREDNISONE 10 MG PO TABS: ORAL_TABLET | ORAL | 0 refills | Status: DC

## 2022-03-23 NOTE — Assessment & Plan Note (Signed)
Acute, 2 week hx, generalized itching and discomfort Notes being under a large amount of stress with school and a residential move  Initial symptoms on L mid abdomen Have spread to thigh/pannus folds, L breast, R breast and L back Denies other symptoms Slight improvement with use of Zyrtec Denies new soaps/detergents/products etc Only Rx change was re-start of Valtrex, no previous hx of SE with use

## 2022-04-01 NOTE — Progress Notes (Deleted)
      Established patient visit   Patient: Julia Lynch   DOB: Jun 01, 1976   46 y.o. Female  MRN: [id number] Visit Date: 04/06/2022  Today's healthcare provider: Jacky Kindle, FNP   No chief complaint on file.  Subjective    HPI  Follow up for rash  The patient was last seen for this 2 weeks ago. Changes made at last visit include continue zyrtec and hydrocortisone cream.  She reports {excellent/good/fair/poor:19665} compliance with treatment. She feels that condition is {improved/worse/unchanged:3041574}. She {is/is not:21021397} having side effects. ***  -----------------------------------------------------------------------------------------   Medications: Outpatient Medications Prior to Visit  Medication Sig   CALCIUM CITRATE PO Take 1 each by mouth in the morning, at noon, and at bedtime. Fusion chews 500mg    citalopram (CELEXA) 40 MG tablet Take 1 tablet (40 mg total) by mouth daily.   clobetasol cream (TEMOVATE) 0.05 % Apply 1 application  topically 2 (two) times daily. Pea size amount to all areas; twice day.   cyclobenzaprine (FLEXERIL) 5 MG tablet Take 1 tablet (5 mg total) by mouth 3 (three) times daily as needed for muscle spasms. (Patient taking differently: Take 5 mg by mouth 3 (three) times daily as needed (Headache).)   diazepam (VALIUM) 5 MG tablet TAKE AS NEEDED FOR HEADACHES, NO MORE THAN 3 IN A DAY (Patient taking differently: Take 5 mg by mouth daily as needed (Headache).)   hydrOXYzine (VISTARIL) 25 MG capsule Take 1-2 capsules as needed, up to 3 times/day for itching. Avoid machinery with use due to drowsiness.   meclizine (ANTIVERT) 25 MG tablet Take 1 tablet (25 mg total) by mouth 3 (three) times daily as needed for dizziness.   Multiple Vitamins-Minerals (BARIATRIC MULTIVITAMINS/IRON) CAPS Take by mouth.   pantoprazole (PROTONIX) 40 MG tablet Take 1 tablet (40 mg total) by mouth daily.   predniSONE (DELTASONE) 10 MG tablet Day 1 take 6 tablets Day  2 take 6 tablets Day 3 take 5 tablets Day 4 take 5 tablets Day 5 take 4 tablets Day 6 take 4 tablets Day 7 take 3 tablets Day 8 take 3 tablets Day 9 take 2 tablets Day 10 take 2 tablets Day 11 take 1 tablet Day 12 take 1 tablet Day 13 take 1/2 tablet Day 14 take 1/2 tablet   valACYclovir (VALTREX) 500 MG tablet Take 1 tablet (500 mg total) by mouth daily. Can increase to twice a day for 5 days in the event of a recurrence   No facility-administered medications prior to visit.    Review of Systems  {Labs  Heme  Chem  Endocrine  Serology  Results Review (optional):23779}   Objective    There were no vitals taken for this visit. {Show previous vital signs (optional):23777}  Physical Exam  ***  No results found for any visits on 04/06/22.  Assessment & Plan     ***  No follow-ups on file.      {provider attestation***:1}   04/08/22, FNP  [facility] [phone] (phone) [phone] (fax)  [facility]

## 2022-04-06 ENCOUNTER — Ambulatory Visit: Payer: BC Managed Care – PPO | Admitting: Family Medicine

## 2022-04-19 ENCOUNTER — Other Ambulatory Visit: Payer: Self-pay | Admitting: Family Medicine

## 2022-04-19 DIAGNOSIS — R21 Rash and other nonspecific skin eruption: Secondary | ICD-10-CM

## 2022-05-27 ENCOUNTER — Other Ambulatory Visit: Payer: Self-pay | Admitting: Obstetrics and Gynecology

## 2022-05-27 DIAGNOSIS — B009 Herpesviral infection, unspecified: Secondary | ICD-10-CM

## 2022-05-28 NOTE — Telephone Encounter (Signed)
Patient needs to be scheduled for annual exam.

## 2022-06-22 ENCOUNTER — Other Ambulatory Visit: Payer: Self-pay | Admitting: Family Medicine

## 2022-06-22 DIAGNOSIS — R21 Rash and other nonspecific skin eruption: Secondary | ICD-10-CM

## 2022-06-30 NOTE — Progress Notes (Signed)
Complete physical exam   Patient: Julia Lynch   DOB: 1976-04-12   46 y.o. Female  MRN: [id number] Visit Date: 07/02/2022  Today's healthcare provider: Jacky Kindle, FNP  Re Introduced to nurse practitioner role and practice setting.  All questions answered.  Discussed provider/patient relationship and expectations.   I,Tiffany J Bragg,acting as a scribe for Jacky Kindle, FNP.,have documented all relevant documentation on the behalf of Jacky Kindle, FNP,as directed by  Jacky Kindle, FNP while in the presence of Jacky Kindle, FNP.   Chief Complaint  Patient presents with   Annual Exam   Subjective    Julia Lynch is a 46 y.o. female who presents today for a complete physical exam.  She reports consuming a general diet. Home exercise routine includes walking. She generally feels well. She reports sleeping well. She does have additional problems to discuss today. Has been having trouble concentrating and been fatigued. HPI   Past Medical History:  Diagnosis Date   Anxiety    Arthritis    Asthma 2006   h/o    Asthma 09/26/2015   Benign essential HTN 10/17/2015   Depression    Diabetic acidosis, type II (HCC)    GERD (gastroesophageal reflux disease)    Headache    History of chicken pox    History of gestational diabetes 01/13/2009   Insulin requiring during pregnancies   History of kidney stones    h/o   History of postpartum depression 05/20/2017   Hypertension    PCOS (polycystic ovarian syndrome)    Polycystic ovaries 10/11/1998   Sleep apnea    uses cpap   Vitamin D deficiency disease    Past Surgical History:  Procedure Laterality Date   ENDOMETRIAL ABLATION Bilateral 03/07/2020   Procedure: ENDOMETRIAL ABLATION;  Surgeon: Hildred Laser, MD;  Location: [facility] ORS;  Service: Gynecology;  Laterality: Bilateral;   HYSTEROSCOPY WITH D & C Bilateral 03/07/2020   Procedure: DILATATION AND CURETTAGE /HYSTEROSCOPY;  Surgeon: Hildred Laser, MD;  Location:  [facility] OR[facility];  Service: Gynecology;  Laterality: Bilateral;   LAPAROSCOPIC GASTRIC SLEEVE RESECTION  08/22/2020   Gaynelle Adu, [facility]   left ankle repair  10/11/1993   UPPER GI ENDOSCOPY N/A 09/01/2020   Procedure: UPPER GI ENDOSCOPY;  Surgeon: Gaynelle Adu, MD;  Location: [facility];  Service: General;  Laterality: N/A;   Social History   Socioeconomic History   Marital status: Married    Spouse name: Not on file   Number of children: 3   Years of education: Not on file   Highest education level: Not on file  Occupational History   Not on file  Tobacco Use   Smoking status: Former    Packs/day: 0.50    Years: 10.00    Total pack years: 5.00    Types: Cigarettes    Quit date: 10/11/2004    Years since quitting: 17.7   Smokeless tobacco: Never  Vaping Use   Vaping Use: Never used  Substance and Sexual Activity   Alcohol use: Not Currently    Alcohol/week: 0.0 standard drinks of alcohol   Drug use: No   Sexual activity: Not Currently    Comment: husband-vasectomy  Other Topics Concern   Not on file  Social History Narrative   Not on file   Social Determinants of Health   Financial Resource Strain: Not on file  Food Insecurity: Not on file  Transportation Needs: Not on file  Physical  Activity: Not on file  Stress: Not on file  Social Connections: Not on file  Intimate Partner Violence: Not on file   Family Status  Relation Name Status   Father  Alive   Mother  Alive   MGF  Deceased at age 46   PGM  Alive   PGF  Deceased       Lougerrits disease   Family History  Problem Relation Age of Onset   Diabetes Father    Hypertension Father    Hepatitis C Mother    Heart attack Maternal Grandfather    Hypertension Paternal Grandmother    Diabetes Paternal Grandmother    Allergies  Allergen Reactions   Ibuprofen     Other reaction(s): Other (See Comments) GI Upset   Lodine [Etodolac] Hives and Other (See Comments)    GI upset    Patient Care  Team: Jacky KindlePayne, Kisa Fujii T, FNP as PCP - General (Family Medicine) Purcell NailsShambley, Melody N, CNM (Inactive) as Midwife (Obstetrics and Gynecology) Pa, [facility]   Medications: Outpatient Medications Prior to Visit  Medication Sig   CALCIUM CITRATE PO Take 1 each by mouth in the morning, at noon, and at bedtime. Fusion chews 500mg    citalopram (CELEXA) 40 MG tablet Take 1 tablet (40 mg total) by mouth daily.   clobetasol cream (TEMOVATE) 0.05 % APPLY 1 APPLICATION TOPICALLY 2 (TWO) TIMES DAILY. PEA SIZE AMOUNT TO ALL AREAS TWICE DAY.   cyclobenzaprine (FLEXERIL) 5 MG tablet Take 1 tablet (5 mg total) by mouth 3 (three) times daily as needed for muscle spasms. (Patient taking differently: Take 5 mg by mouth 3 (three) times daily as needed (Headache).)   diazepam (VALIUM) 5 MG tablet TAKE AS NEEDED FOR HEADACHES, NO MORE THAN 3 IN A DAY (Patient taking differently: Take 5 mg by mouth daily as needed (Headache).)   hydrOXYzine (VISTARIL) 25 MG capsule Take 1-2 capsules as needed, up to 3 times/day for itching. Avoid machinery with use due to drowsiness.   meclizine (ANTIVERT) 25 MG tablet Take 1 tablet (25 mg total) by mouth 3 (three) times daily as needed for dizziness.   Multiple Vitamins-Minerals (BARIATRIC MULTIVITAMINS/IRON) CAPS Take by mouth.   pantoprazole (PROTONIX) 40 MG tablet Take 1 tablet (40 mg total) by mouth daily.   valACYclovir (VALTREX) 500 MG tablet TAKE 1 TABLET (500 MG TOTAL) BY MOUTH DAILY. CAN INCREASE TO TWICE A DAY FOR 5 DAYS IN THE EVENT OF A RECURRENCE   [DISCONTINUED] predniSONE (DELTASONE) 10 MG tablet Day 1 take 6 tablets Day 2 take 6 tablets Day 3 take 5 tablets Day 4 take 5 tablets Day 5 take 4 tablets Day 6 take 4 tablets Day 7 take 3 tablets Day 8 take 3 tablets Day 9 take 2 tablets Day 10 take 2 tablets Day 11 take 1 tablet Day 12 take 1 tablet Day 13 take 1/2 tablet Day 14 take 1/2 tablet   No facility-administered medications prior to visit.    Review of  Systems  Musculoskeletal:  Positive for back pain and neck pain.  Skin:  Positive for rash.  Neurological:  Positive for headaches.  Psychiatric/Behavioral:  Positive for agitation and decreased concentration.     Objective     BP 136/88 (BP Location: Right Arm, Patient Position: Sitting, Cuff Size: Normal)   Pulse 68   Resp 16   Ht 5\' 1"  (1.549 m)   Wt 224 lb (101.6 kg)   SpO2 98%   BMI 42.32 kg/m  Physical Exam Vitals and nursing note reviewed.  Constitutional:      General: She is awake. She is not in acute distress.    Appearance: Normal appearance. She is well-developed and well-groomed. She is obese. She is not ill-appearing, toxic-appearing or diaphoretic.  HENT:     Head: Normocephalic and atraumatic.     Jaw: There is normal jaw occlusion. No trismus, tenderness, swelling or pain on movement.     Right Ear: Hearing, tympanic membrane, ear canal and external ear normal. There is no impacted cerumen.     Left Ear: Hearing, tympanic membrane, ear canal and external ear normal. There is no impacted cerumen.     Nose: Nose normal. No congestion or rhinorrhea.     Right Turbinates: Not enlarged, swollen or pale.     Left Turbinates: Not enlarged, swollen or pale.     Right Sinus: No maxillary sinus tenderness or frontal sinus tenderness.     Left Sinus: No maxillary sinus tenderness or frontal sinus tenderness.     Mouth/Throat:     Lips: Pink.     Mouth: Mucous membranes are moist. No injury.     Tongue: No lesions.     Pharynx: Oropharynx is clear. Uvula midline. No pharyngeal swelling, oropharyngeal exudate, posterior oropharyngeal erythema or uvula swelling.     Tonsils: No tonsillar exudate or tonsillar abscesses.  Eyes:     General: Lids are normal. Lids are everted, no foreign bodies appreciated. Vision grossly intact. Gaze aligned appropriately. No allergic shiner or visual field deficit.       Right eye: No discharge.        Left eye: No discharge.      Extraocular Movements: Extraocular movements intact.     Conjunctiva/sclera: Conjunctivae normal.     Right eye: Right conjunctiva is not injected. No exudate.    Left eye: Left conjunctiva is not injected. No exudate.    Pupils: Pupils are equal, round, and reactive to light.  Neck:     Thyroid: No thyroid mass, thyromegaly or thyroid tenderness.     Vascular: No carotid bruit.     Trachea: Trachea normal.  Cardiovascular:     Rate and Rhythm: Normal rate and regular rhythm.     Pulses: Normal pulses.          Carotid pulses are 2+ on the right side and 2+ on the left side.      Radial pulses are 2+ on the right side and 2+ on the left side.       Dorsalis pedis pulses are 2+ on the right side and 2+ on the left side.       Posterior tibial pulses are 2+ on the right side and 2+ on the left side.     Heart sounds: Normal heart sounds, S1 normal and S2 normal. No murmur heard.    No friction rub. No gallop.  Pulmonary:     Effort: Pulmonary effort is normal. No respiratory distress.     Breath sounds: Normal breath sounds and air entry. No stridor. No wheezing, rhonchi or rales.  Chest:     Chest wall: No tenderness.     Comments: Breasts: symmetric fibrous changes in both upper outer quadrants per pt report, risk and benefit of breast self-exam was discussed, not examined  Abdominal:     General: Abdomen is flat. Bowel sounds are normal. There is no distension.     Palpations: Abdomen is soft. There is no mass.  Tenderness: There is no abdominal tenderness. There is no right CVA tenderness, left CVA tenderness, guarding or rebound.     Hernia: No hernia is present.  Genitourinary:    Comments: Exam deferred; denies complaints Musculoskeletal:        General: No swelling, tenderness, deformity or signs of injury. Normal range of motion.     Cervical back: Full passive range of motion without pain, normal range of motion and neck supple. No edema, rigidity or tenderness. No  muscular tenderness.     Right lower leg: No edema.     Left lower leg: No edema.  Lymphadenopathy:     Cervical: No cervical adenopathy.     Right cervical: No superficial, deep or posterior cervical adenopathy.    Left cervical: No superficial, deep or posterior cervical adenopathy.  Skin:    General: Skin is warm and dry.     Capillary Refill: Capillary refill takes less than 2 seconds.     Coloration: Skin is not jaundiced or pale.     Findings: No bruising, erythema, lesion or rash.  Neurological:     General: No focal deficit present.     Mental Status: She is alert and oriented to person, place, and time. Mental status is at baseline.     GCS: GCS eye subscore is 4. GCS verbal subscore is 5. GCS motor subscore is 6.     Sensory: Sensation is intact. No sensory deficit.     Motor: Motor function is intact. No weakness.     Coordination: Coordination is intact. Coordination normal.     Gait: Gait is intact. Gait normal.  Psychiatric:        Attention and Perception: Attention and perception normal.        Mood and Affect: Mood and affect normal.        Speech: Speech normal.        Behavior: Behavior normal. Behavior is cooperative.        Thought Content: Thought content normal.        Cognition and Memory: Cognition and memory normal.        Judgment: Judgment normal.      Last depression screening scores    07/02/2022    8:49 AM 03/23/2022    1:12 PM 07/01/2021   10:11 AM  PHQ 2/9 Scores  PHQ - 2 Score 2 1 1   PHQ- 9 Score 11 5 2    Last fall risk screening    07/02/2022    8:48 AM  Fall Risk   Falls in the past year? 0  Number falls in past yr: 0  Injury with Fall? 0  Risk for fall due to : No Fall Risks  Follow up Falls evaluation completed   Last Audit-C alcohol use screening    07/02/2022    8:49 AM  Alcohol Use Disorder Test (AUDIT)  1. How often do you have a drink containing alcohol? 1  2. How many drinks containing alcohol do you have on a typical  day when you are drinking? 0  3. How often do you have six or more drinks on one occasion? 0  AUDIT-C Score 1   A score of 3 or more in women, and 4 or more in men indicates increased risk for alcohol abuse, EXCEPT if all of the points are from question 1   No results found for any visits on 07/02/22.  Assessment & Plan    Routine Health Maintenance and Physical Exam  Exercise Activities and Dietary recommendations  Goals   None     Immunization History  Administered Date(s) Administered   Influenza,inj,Quad PF,6+ Mos 09/26/2015, 08/20/2016, 10/13/2018, 06/18/2020, 07/01/2021, 07/02/2022   Moderna Sars-Covid-2 Vaccination 01/18/2020, 02/21/2020   Tdap 04/20/2012, 07/08/2014    Health Maintenance  Topic Date Due   OPHTHALMOLOGY EXAM  12/09/2019   FOOT EXAM  12/15/2019   COVID-19 Vaccine (3 - Moderna series) 04/17/2020   Diabetic kidney evaluation - Urine ACR  06/18/2021   COLONOSCOPY (Pts 45-59yrs Insurance coverage will need to be confirmed)  Never done   PAP SMEAR-Modifier  10/13/2021   HEMOGLOBIN A1C  12/29/2021   MAMMOGRAM  02/02/2022   Diabetic kidney evaluation - GFR measurement  07/01/2022   TETANUS/TDAP  07/08/2024   INFLUENZA VACCINE  Completed   Hepatitis C Screening  Completed   HIV Screening  Completed   HPV VACCINES  Aged Out    Discussed health benefits of physical activity, and encouraged her to engage in regular exercise appropriate for her age and condition.  Problem List Items Addressed This Visit       Cardiovascular and Mediastinum   Hypertension associated with diabetes ([place])    Chronic, borderline Goal <130/<80 Repeat CBC and CMP Declines medication start; plans to focus on diet and exercise       Relevant Medications   tirzepatide (MOUNJARO) 2.5 MG/0.5ML Pen   Other Relevant Orders   Comprehensive metabolic panel   CBC with Differential/Platelet     Digestive   Esophageal reflux    Chronic, stable Continue protonix 40 mg to  assist       Relevant Orders   Comprehensive metabolic panel   CBC with Differential/Platelet     Endocrine   Diabetes mellitus (HCC)    Chronic, labile Repeat A1c and UAC micro Request for trial of injectable to assist with weight loss with current Body mass index is 42.32 kg/m.       Relevant Medications   tirzepatide [facility]) 2.5 MG/0.5ML Pen   Other Relevant Orders   Comprehensive metabolic panel   CBC with Differential/Platelet   Hemoglobin A1c   Urine Microalbumin w/creat. ratio   Hyperlipidemia associated with type 2 diabetes mellitus (HCC)    Chronic, previously elevated in 2021 Repeat LP recommend diet low in saturated fat and regular exercise - 30 min at least 5 times per week Not on statin- goal of 55-70 with DM      Relevant Medications   tirzepatide (MOUNJARO) 2.5 MG/0.5ML Pen   Other Relevant Orders   Lipid panel     Other   Annual physical exam - Primary    Discuss annual vision and dental Will schedule mammo and do colo guard Things to do to keep yourself healthy  - Exercise at least 30-45 minutes a day, 3-4 days a week.  - Eat a low-fat diet with lots of fruits and vegetables, up to 7-9 servings per day.  - Seatbelts can save your life. Wear them always.  - Smoke detectors on every level of your home, check batteries every year.  - Eye Doctor - have an eye exam every 1-2 years  - Safe sex - if you may be exposed to STDs, use a condom.  - Alcohol -  If you drink, do it moderately, less than 2 drinks per day.  - [place]. Choose someone to speak for you if you are not able.  - Depression is common in our stressful world.If you're  feeling down or losing interest in things you normally enjoy, please come in for a visit.  - Violence - If anyone is threatening or hurting you, please call immediately.        Relevant Orders   Comprehensive metabolic panel   CBC with Differential/Platelet   TSH + free T4   Lipid panel    Attention deficit hyperactivity disorder (ADHD), predominantly inattentive type    Chronic, worsening Failed concerta s/s side effects Trial of wellbutrin       Hypersomnia    Chronic, stable Reports fatigue however, under a lot of stressors       Relevant Orders   CBC with Differential/Platelet   Iron, TIBC and Ferritin Panel   TSH + free T4   Vitamin D (25 hydroxy)   B12 and Folate Panel   Morbid obesity (HCC)    Chronic, stable Body mass index is 42.32 kg/m. Discussed importance of healthy weight management Discussed diet and exercise       Relevant Medications   tirzepatide (MOUNJARO) 2.5 MG/0.5ML Pen   Need for influenza vaccination    Consented; VIS made available; no immediate side effects following administration; plan to repeat annually        Relevant Orders   Flu Vaccine QUAD 6+ mos PF IM (Fluarix Quad PF) (Completed)   S/P bariatric surgery    S/p gastric sleeve; has not lost expected weight Endorses no diet control and little exercise as plate is full as a parent, wife, student      Relevant Medications   tirzepatide (MOUNJARO) 2.5 MG/0.5ML Pen   Other Relevant Orders   Comprehensive metabolic panel   CBC with Differential/Platelet   Iron, TIBC and Ferritin Panel   TSH + free T4   Hemoglobin A1c   Lipid panel   Vitamin D (25 hydroxy)   B12 and Folate Panel   Screening breast examination    Reports fibrous changes; no exam done in office Referral for mammogram for annual screening       Relevant Orders   MM 3D SCREEN BREAST BILATERAL   Screening for colon cancer    Initial screen for colon cancer  Agreeable to colo guard       Relevant Orders   Cologuard   Other Visit Diagnoses     Depression, recurrent (HCC)       Relevant Medications   buPROPion (WELLBUTRIN XL) 150 MG 24 hr tablet   tirzepatide (MOUNJARO) 2.5 MG/0.5ML Pen   Other Relevant Orders   Comprehensive metabolic panel   CBC with Differential/Platelet   Iron, TIBC and  Ferritin Panel   TSH + free T4   Hemoglobin A1c   Lipid panel   Vitamin D (25 hydroxy)   B12 and Folate Panel        Return in about 6 weeks (around 08/13/2022) for ADHD mgmt with wellbutrin.     Leilani Merl, FNP, have reviewed all documentation for this visit. The documentation on 07/02/22 for the exam, diagnosis, procedures, and orders are all accurate and complete.    Jacky Kindle, FNP  [facility] [phone] (phone) [phone] (fax)  [facility]

## 2022-07-02 ENCOUNTER — Ambulatory Visit (INDEPENDENT_AMBULATORY_CARE_PROVIDER_SITE_OTHER): Payer: BC Managed Care – PPO | Admitting: Family Medicine

## 2022-07-02 ENCOUNTER — Encounter: Payer: Self-pay | Admitting: Family Medicine

## 2022-07-02 VITALS — BP 136/88 | HR 68 | Resp 16 | Ht 61.0 in | Wt 224.0 lb

## 2022-07-02 DIAGNOSIS — E785 Hyperlipidemia, unspecified: Secondary | ICD-10-CM

## 2022-07-02 DIAGNOSIS — Z0000 Encounter for general adult medical examination without abnormal findings: Secondary | ICD-10-CM | POA: Diagnosis not present

## 2022-07-02 DIAGNOSIS — K219 Gastro-esophageal reflux disease without esophagitis: Secondary | ICD-10-CM

## 2022-07-02 DIAGNOSIS — F900 Attention-deficit hyperactivity disorder, predominantly inattentive type: Secondary | ICD-10-CM

## 2022-07-02 DIAGNOSIS — Z23 Encounter for immunization: Secondary | ICD-10-CM | POA: Diagnosis not present

## 2022-07-02 DIAGNOSIS — E1159 Type 2 diabetes mellitus with other circulatory complications: Secondary | ICD-10-CM

## 2022-07-02 DIAGNOSIS — Z1211 Encounter for screening for malignant neoplasm of colon: Secondary | ICD-10-CM

## 2022-07-02 DIAGNOSIS — E1169 Type 2 diabetes mellitus with other specified complication: Secondary | ICD-10-CM

## 2022-07-02 DIAGNOSIS — F339 Major depressive disorder, recurrent, unspecified: Secondary | ICD-10-CM | POA: Diagnosis not present

## 2022-07-02 DIAGNOSIS — E1165 Type 2 diabetes mellitus with hyperglycemia: Secondary | ICD-10-CM

## 2022-07-02 DIAGNOSIS — Z1239 Encounter for other screening for malignant neoplasm of breast: Secondary | ICD-10-CM

## 2022-07-02 DIAGNOSIS — I152 Hypertension secondary to endocrine disorders: Secondary | ICD-10-CM | POA: Insufficient documentation

## 2022-07-02 DIAGNOSIS — G4710 Hypersomnia, unspecified: Secondary | ICD-10-CM

## 2022-07-02 DIAGNOSIS — Z9884 Bariatric surgery status: Secondary | ICD-10-CM

## 2022-07-02 MED ORDER — BUPROPION HCL ER (XL) 150 MG PO TB24: 300.0000 mg | ORAL_TABLET | Freq: Every day | ORAL | 1 refills | Status: DC

## 2022-07-02 MED ORDER — TIRZEPATIDE 2.5 MG/0.5ML ~~LOC~~ SOAJ: 2.5000 mg | SUBCUTANEOUS | 0 refills | Status: DC

## 2022-07-02 NOTE — Assessment & Plan Note (Signed)
Chronic, labile Repeat A1c and UAC micro Request for trial of injectable to assist with weight loss with current Body mass index is 42.32 kg/m.

## 2022-07-02 NOTE — Assessment & Plan Note (Signed)
Chronic, stable Body mass index is 42.32 kg/m. Discussed importance of healthy weight management Discussed diet and exercise

## 2022-07-02 NOTE — Assessment & Plan Note (Signed)
Chronic, previously elevated in 2021 Repeat LP recommend diet low in saturated fat and regular exercise - 30 min at least 5 times per week Not on statin- goal of 55-70 with DM

## 2022-07-02 NOTE — Assessment & Plan Note (Signed)
S/p gastric sleeve; has not lost expected weight Endorses no diet control and little exercise as plate is full as a parent, wife, [employer]

## 2022-07-02 NOTE — Assessment & Plan Note (Signed)
Chronic, stable Reports fatigue however, under a lot of stressors

## 2022-07-02 NOTE — Assessment & Plan Note (Signed)
Initial screen for colon cancer  Agreeable to colo guard

## 2022-07-02 NOTE — Assessment & Plan Note (Signed)
Chronic, stable Continue protonix 40 mg to assist

## 2022-07-02 NOTE — Assessment & Plan Note (Signed)
Consented; VIS made available; no immediate side effects following administration; plan to repeat annually   

## 2022-07-02 NOTE — Assessment & Plan Note (Signed)
Chronic, worsening Failed concerta s/s side effects Trial of wellbutrin

## 2022-07-02 NOTE — Assessment & Plan Note (Signed)
Chronic, borderline Goal <130/<80 Repeat CBC and CMP Declines medication start; plans to focus on diet and exercise

## 2022-07-02 NOTE — Patient Instructions (Signed)
Please call and schedule your mammogram:  [facility]  [address], [address] [facility] [place],  [place]  [address] Get Driving Directions Main: [phone]  Sunday:Closed Monday:7:20 AM - 5:00 PM Tuesday:7:20 AM - 5:00 PM Wednesday:7:20 AM - 5:00 PM Thursday:7:20 AM - 5:00 PM Friday:7:20 AM - 4:30 PM Saturday:Closed  

## 2022-07-02 NOTE — Assessment & Plan Note (Signed)
Discuss annual vision and dental Will schedule mammo and do colo guard Things to do to keep yourself healthy  - Exercise at least 30-45 minutes a day, 3-4 days a week.  - Eat a low-fat diet with lots of fruits and vegetables, up to 7-9 servings per day.  - Seatbelts can save your life. Wear them always.  - Smoke detectors on every level of your home, check batteries every year.  - Eye Doctor - have an eye exam every 1-2 years  - Safe sex - if you may be exposed to STDs, use a condom.  - Alcohol -  If you drink, do it moderately, less than 2 drinks per day.  - [place]. Choose someone to speak for you if you are not able.  - Depression is common in our stressful world.If you're feeling down or losing interest in things you normally enjoy, please come in for a visit.  - Violence - If anyone is threatening or hurting you, please call immediately.

## 2022-07-02 NOTE — Assessment & Plan Note (Signed)
Reports fibrous changes; no exam done in office Referral for mammogram for annual screening

## 2022-07-03 LAB — COMPREHENSIVE METABOLIC PANEL
ALT: 15 IU/L (ref 0–32)
AST: 17 IU/L (ref 0–40)
Albumin/Globulin Ratio: 1.9 (ref 1.2–2.2)
Albumin: 4.2 g/dL (ref 3.9–4.9)
Alkaline Phosphatase: 60 IU/L (ref 44–121)
BUN/Creatinine Ratio: 17 (ref 9–23)
BUN: 14 mg/dL (ref 6–24)
Bilirubin Total: 0.3 mg/dL (ref 0.0–1.2)
CO2: 26 mmol/L (ref 20–29)
Calcium: 9.4 mg/dL (ref 8.7–10.2)
Chloride: 101 mmol/L (ref 96–106)
Creatinine, Ser: 0.81 mg/dL (ref 0.57–1.00)
Globulin, Total: 2.2 g/dL (ref 1.5–4.5)
Glucose: 93 mg/dL (ref 70–99)
Potassium: 4.2 mmol/L (ref 3.5–5.2)
Sodium: 138 mmol/L (ref 134–144)
Total Protein: 6.4 g/dL (ref 6.0–8.5)
eGFR: 91 mL/min/{1.73_m2} (ref >59)

## 2022-07-03 LAB — IRON,TIBC AND FERRITIN PANEL
Ferritin: 86 ng/mL (ref 15–150)
Iron Saturation: 34 % (ref 15–55)
Iron: 88 ug/dL (ref 27–159)
Total Iron Binding Capacity: 261 ug/dL (ref 250–450)
UIBC: 173 ug/dL (ref 131–425)

## 2022-07-03 LAB — LIPID PANEL
Chol/HDL Ratio: 3.6 ratio (ref 0.0–4.4)
Cholesterol, Total: 181 mg/dL (ref 100–199)
HDL: 50 mg/dL (ref >39)
LDL Chol Calc (NIH): 99 mg/dL (ref 0–99)
Triglycerides: 185 mg/dL — ABNORMAL HIGH (ref 0–149)
VLDL Cholesterol Cal: 32 mg/dL (ref 5–40)

## 2022-07-03 LAB — CBC WITH DIFFERENTIAL/PLATELET
Basophils Absolute: 0 10*3/uL (ref 0.0–0.2)
Basos: 1 %
EOS (ABSOLUTE): 0.1 10*3/uL (ref 0.0–0.4)
Eos: 1 %
Hematocrit: 44.2 % (ref 34.0–46.6)
Hemoglobin: 15.4 g/dL (ref 11.1–15.9)
Immature Grans (Abs): 0 10*3/uL (ref 0.0–0.1)
Immature Granulocytes: 0 %
Lymphocytes Absolute: 2.8 10*3/uL (ref 0.7–3.1)
Lymphs: 39 %
MCH: 30.6 pg (ref 26.6–33.0)
MCHC: 34.8 g/dL (ref 31.5–35.7)
MCV: 88 fL (ref 79–97)
Monocytes Absolute: 0.5 10*3/uL (ref 0.1–0.9)
Monocytes: 7 %
Neutrophils Absolute: 3.7 10*3/uL (ref 1.4–7.0)
Neutrophils: 52 %
Platelets: 256 10*3/uL (ref 150–450)
RBC: 5.04 x10E6/uL (ref 3.77–5.28)
RDW: 12.3 % (ref 11.7–15.4)
WBC: 7.1 10*3/uL (ref 3.4–10.8)

## 2022-07-03 LAB — VITAMIN D 25 HYDROXY (VIT D DEFICIENCY, FRACTURES): Vit D, 25-Hydroxy: 58 ng/mL (ref 30.0–100.0)

## 2022-07-03 LAB — HEMOGLOBIN A1C
Est. average glucose Bld gHb Est-mCnc: 108 mg/dL
Hgb A1c MFr Bld: 5.4 % (ref 4.8–5.6)

## 2022-07-03 LAB — TSH+FREE T4
Free T4: 1.28 ng/dL (ref 0.82–1.77)
TSH: 0.591 u[IU]/mL (ref 0.450–4.500)

## 2022-07-03 LAB — B12 AND FOLATE PANEL
Folate: 16.2 ng/mL (ref >3.0)
Vitamin B-12: 1208 pg/mL (ref 232–1245)

## 2022-07-04 LAB — MICROALBUMIN / CREATININE URINE RATIO
Creatinine, Urine: 36.4 mg/dL
Microalb/Creat Ratio: <8 mg/g creat (ref 0–29)
Microalbumin, Urine: <3 ug/mL

## 2022-07-18 LAB — COLOGUARD: COLOGUARD: NEGATIVE

## 2022-07-21 NOTE — Progress Notes (Signed)
Negative colo guard; repeat in 3 years or complete traditional colonoscopy.  Gwyneth Sprout, [address] [place], [place] [address] [phone] (phone) [phone] (fax) [place]

## 2022-07-23 ENCOUNTER — Other Ambulatory Visit: Payer: Self-pay | Admitting: Family Medicine

## 2022-07-23 DIAGNOSIS — F339 Major depressive disorder, recurrent, unspecified: Secondary | ICD-10-CM

## 2022-07-23 DIAGNOSIS — E1165 Type 2 diabetes mellitus with hyperglycemia: Secondary | ICD-10-CM

## 2022-08-23 NOTE — Progress Notes (Deleted)
      Established patient visit   Patient: Julia Lynch   DOB: 12-Sep-1976   46 y.o. Female  MRN: [id number] Visit Date: 08/27/2022  Today's healthcare provider: Jacky Kindle, FNP   No chief complaint on file.  Subjective    HPI  Follow up for ADHD  The patient was last seen for this 7 weeks ago. Changes made at last visit include continue wellbutrin.  She reports {excellent/good/fair/poor:19665} compliance with treatment. She feels that condition is {improved/worse/unchanged:3041574}. She {is/is not:21021397} having side effects. ***  -----------------------------------------------------------------------------------------   Medications: Outpatient Medications Prior to Visit  Medication Sig   buPROPion (WELLBUTRIN XL) 150 MG 24 hr tablet Take 2 tablets (300 mg total) by mouth daily.   CALCIUM CITRATE PO Take 1 each by mouth in the morning, at noon, and at bedtime. Fusion chews 500mg    citalopram (CELEXA) 40 MG tablet TAKE 1 TABLET BY MOUTH EVERY DAY   clobetasol cream (TEMOVATE) 0.05 % APPLY 1 APPLICATION TOPICALLY 2 (TWO) TIMES DAILY. PEA SIZE AMOUNT TO ALL AREAS TWICE DAY.   cyclobenzaprine (FLEXERIL) 5 MG tablet Take 1 tablet (5 mg total) by mouth 3 (three) times daily as needed for muscle spasms. (Patient taking differently: Take 5 mg by mouth 3 (three) times daily as needed (Headache).)   diazepam (VALIUM) 5 MG tablet TAKE AS NEEDED FOR HEADACHES, NO MORE THAN 3 IN A DAY (Patient taking differently: Take 5 mg by mouth daily as needed (Headache).)   hydrOXYzine (VISTARIL) 25 MG capsule Take 1-2 capsules as needed, up to 3 times/day for itching. Avoid machinery with use due to drowsiness.   meclizine (ANTIVERT) 25 MG tablet Take 1 tablet (25 mg total) by mouth 3 (three) times daily as needed for dizziness.   Multiple Vitamins-Minerals (BARIATRIC MULTIVITAMINS/IRON) CAPS Take by mouth.   pantoprazole (PROTONIX) 40 MG tablet TAKE 1 TABLET BY MOUTH EVERY DAY    tirzepatide (MOUNJARO) 2.5 MG/0.5ML Pen Inject 2.5 mg into the skin once a week.   valACYclovir (VALTREX) 500 MG tablet TAKE 1 TABLET (500 MG TOTAL) BY MOUTH DAILY. CAN INCREASE TO TWICE A DAY FOR 5 DAYS IN THE EVENT OF A RECURRENCE   No facility-administered medications prior to visit.    Review of Systems  {Labs  Heme  Chem  Endocrine  Serology  Results Review (optional):23779}   Objective    There were no vitals taken for this visit. {Show previous vital signs (optional):23777}  Physical Exam  ***  No results found for any visits on 08/27/22.  Assessment & Plan     ***  No follow-ups on file.      {provider attestation***:1}   08/29/22, FNP  [facility] [phone] (phone) [phone] (fax)  [facility]

## 2022-08-27 ENCOUNTER — Ambulatory Visit: Payer: BC Managed Care – PPO | Admitting: Family Medicine

## 2022-08-31 ENCOUNTER — Ambulatory Visit: Payer: Self-pay

## 2022-08-31 ENCOUNTER — Ambulatory Visit (INDEPENDENT_AMBULATORY_CARE_PROVIDER_SITE_OTHER): Payer: BC Managed Care – PPO | Admitting: Family Medicine

## 2022-08-31 ENCOUNTER — Other Ambulatory Visit: Payer: Self-pay | Admitting: Family Medicine

## 2022-08-31 ENCOUNTER — Encounter: Payer: Self-pay | Admitting: Family Medicine

## 2022-08-31 VITALS — BP 133/80 | HR 96 | Temp 98.2°F | Resp 16 | Ht 61.0 in | Wt 225.0 lb

## 2022-08-31 DIAGNOSIS — F458 Other somatoform disorders: Secondary | ICD-10-CM

## 2022-08-31 DIAGNOSIS — M542 Cervicalgia: Secondary | ICD-10-CM

## 2022-08-31 DIAGNOSIS — G44229 Chronic tension-type headache, not intractable: Secondary | ICD-10-CM | POA: Diagnosis not present

## 2022-08-31 DIAGNOSIS — G8929 Other chronic pain: Secondary | ICD-10-CM | POA: Insufficient documentation

## 2022-08-31 DIAGNOSIS — G4733 Obstructive sleep apnea (adult) (pediatric): Secondary | ICD-10-CM

## 2022-08-31 DIAGNOSIS — R519 Headache, unspecified: Secondary | ICD-10-CM

## 2022-08-31 MED ORDER — METHOCARBAMOL 750 MG PO TABS: 750.0000 mg | ORAL_TABLET | Freq: Three times a day (TID) | ORAL | 0 refills | Status: DC

## 2022-08-31 MED ORDER — DIAZEPAM 2 MG PO TABS: ORAL_TABLET | ORAL | 0 refills | Status: DC

## 2022-08-31 MED ORDER — CYCLOBENZAPRINE HCL 10 MG PO TABS: 10.0000 mg | ORAL_TABLET | Freq: Every day | ORAL | 0 refills | Status: DC

## 2022-08-31 NOTE — Assessment & Plan Note (Signed)
Chronic, stable Recent exacerbation x 3 weeks Has not improved with use of tylenol arthritis, flexeril (46 years old), valium (46 years old) Previously has tried baclofen, but does not remember what the use/side effects were Has also tried peppermint oil and daily massage Stretches encouraged to open chest wall

## 2022-08-31 NOTE — Assessment & Plan Note (Signed)
Chronic, stress/tension related Does not use a mouth guard Reports dentist has told her she grinds/clenches with signs of flattening

## 2022-08-31 NOTE — Progress Notes (Signed)
Established patient visit   Patient: Julia Lynch   DOB: 1976-05-09   46 y.o. Female  MRN: [id number] Visit Date: 08/31/2022  Today's healthcare provider: Jacky Kindle, FNP  Re Introduced to nurse practitioner role and practice setting.  All questions answered.  Discussed provider/patient relationship and expectations.   I,Tiffany J Bragg,acting as a scribe for Jacky Kindle, FNP.,have documented all relevant documentation on the behalf of Jacky Kindle, FNP,as directed by  Jacky Kindle, FNP while in the presence of Jacky Kindle, FNP.   Chief Complaint  Patient presents with   Headache    Patient complains of headaches for several weeks that come and go but has had one every day for the past 3 weeks.    Subjective    HPI HPI     Headache    Additional comments: Patient complains of headaches for several weeks that come and go but has had one every day for the past 3 weeks.       Last edited by Marlana Salvage, CMA on 08/31/2022  1:10 PM.       Medications: Outpatient Medications Prior to Visit  Medication Sig   buPROPion (WELLBUTRIN XL) 150 MG 24 hr tablet Take 2 tablets (300 mg total) by mouth daily.   CALCIUM CITRATE PO Take 1 each by mouth in the morning, at noon, and at bedtime. Fusion chews 500mg    citalopram (CELEXA) 40 MG tablet TAKE 1 TABLET BY MOUTH EVERY DAY   cyclobenzaprine (FLEXERIL) 5 MG tablet Take 1 tablet (5 mg total) by mouth 3 (three) times daily as needed for muscle spasms. (Patient taking differently: Take 5 mg by mouth 3 (three) times daily as needed (Headache).)   Multiple Vitamins-Minerals (BARIATRIC MULTIVITAMINS/IRON) CAPS Take by mouth.   pantoprazole (PROTONIX) 40 MG tablet TAKE 1 TABLET BY MOUTH EVERY DAY   valACYclovir (VALTREX) 500 MG tablet TAKE 1 TABLET (500 MG TOTAL) BY MOUTH DAILY. CAN INCREASE TO TWICE A DAY FOR 5 DAYS IN THE EVENT OF A RECURRENCE   [DISCONTINUED] diazepam (VALIUM) 5 MG tablet TAKE AS NEEDED FOR  HEADACHES, NO MORE THAN 3 IN A DAY (Patient taking differently: Take 5 mg by mouth daily as needed (Headache).)   clobetasol cream (TEMOVATE) 0.05 % APPLY 1 APPLICATION TOPICALLY 2 (TWO) TIMES DAILY. PEA SIZE AMOUNT TO ALL AREAS TWICE DAY. (Patient not taking: Reported on 08/31/2022)   hydrOXYzine (VISTARIL) 25 MG capsule Take 1-2 capsules as needed, up to 3 times/day for itching. Avoid machinery with use due to drowsiness. (Patient not taking: Reported on 08/31/2022)   meclizine (ANTIVERT) 25 MG tablet Take 1 tablet (25 mg total) by mouth 3 (three) times daily as needed for dizziness. (Patient not taking: Reported on 08/31/2022)   tirzepatide [facility]) 2.5 MG/0.5ML Pen Inject 2.5 mg into the skin once a week. (Patient not taking: Reported on 08/31/2022)   No facility-administered medications prior to visit.    Review of Systems    Objective    BP 133/80 (BP Location: Right Arm, Patient Position: Sitting, Cuff Size: Normal)   Pulse 96   Temp 98.2 F (36.8 C) (Oral)   Resp 16   Ht 5\' 1"  (1.549 m)   Wt 225 lb (102.1 kg)   SpO2 99%   BMI 42.51 kg/m   Physical Exam Vitals and nursing note reviewed.  Constitutional:      General: She is not in acute distress.    Appearance: Normal appearance.  She is well-developed. She is obese. She is not ill-appearing, toxic-appearing or diaphoretic.  HENT:     Head: Normocephalic and atraumatic.  Neck:   Cardiovascular:     Rate and Rhythm: Normal rate and regular rhythm.     Pulses: Normal pulses.     Heart sounds: Normal heart sounds. No murmur heard.    No friction rub. No gallop.  Pulmonary:     Effort: Pulmonary effort is normal. No respiratory distress.     Breath sounds: Normal breath sounds. No stridor. No wheezing, rhonchi or rales.  Chest:     Chest wall: No tenderness.  Abdominal:     General: Bowel sounds are normal.     Palpations: Abdomen is soft.  Musculoskeletal:        General: No swelling, tenderness, deformity or signs  of injury. Normal range of motion.     Cervical back: Normal range of motion and neck supple. Muscular tenderness present.     Right lower leg: No edema.     Left lower leg: No edema.  Skin:    General: Skin is warm and dry.     Capillary Refill: Capillary refill takes less than 2 seconds.     Coloration: Skin is not jaundiced or pale.     Findings: No bruising, erythema, lesion or rash.  Neurological:     General: No focal deficit present.     Mental Status: She is alert and oriented to person, place, and time. Mental status is at baseline.     GCS: GCS eye subscore is 4. GCS verbal subscore is 5. GCS motor subscore is 6.     Cranial Nerves: No cranial nerve deficit.     Sensory: No sensory deficit.     Motor: No weakness.     Coordination: Coordination normal.  Psychiatric:        Mood and Affect: Mood normal.        Speech: Speech normal.        Behavior: Behavior normal.        Thought Content: Thought content normal.        Judgment: Judgment normal.      No results found for any visits on 08/31/22.  Assessment & Plan     Problem List Items Addressed This Visit       Respiratory   OSA on CPAP    Chronic, has not worn CPAP since WLS Husband reports that snoring is improved Pt denies HA upon awakening Has equipment; has not repeat PSG since WLS        Digestive   Bruxism    Chronic, stress/tension related Does not use a mouth guard Reports dentist has told her she grinds/clenches with signs of flattening       Relevant Medications   diazepam (VALIUM) 2 MG tablet   cyclobenzaprine (FLEXERIL) 10 MG tablet   methocarbamol (ROBAXIN-750) 750 MG tablet     Other   Chronic tension-type headache, not intractable - Primary    Chronic, stable Recent exacerbation x 3 weeks Has not improved with use of tylenol arthritis, flexeril (46 years old), valium (45 years old) Previously has tried baclofen, but does not remember what the use/side effects were Has also tried  peppermint oil and daily massage Stretches encouraged to open chest wall       Relevant Medications   diazepam (VALIUM) 2 MG tablet   cyclobenzaprine (FLEXERIL) 10 MG tablet   methocarbamol (ROBAXIN-750) 750 MG tablet   Neck  pain, chronic    Bilateral neck pain in setting of acute stressors, concurrent bruxism and headaches  Encouraged chest openers with use of vertical surface like wall or use of rolled towel under back to allow shoulders to relax       Relevant Medications   cyclobenzaprine (FLEXERIL) 10 MG tablet   methocarbamol (ROBAXIN-750) 750 MG tablet   Return if symptoms worsen or fail to improve.     Leilani Merl, FNP, have reviewed all documentation for this visit. The documentation on 08/31/22 for the exam, diagnosis, procedures, and orders are all accurate and complete.  Jacky Kindle, FNP  [facility] [phone] (phone) [phone] (fax)  [facility]

## 2022-08-31 NOTE — Assessment & Plan Note (Signed)
Chronic, has not worn CPAP since WLS Husband reports that snoring is improved Pt denies HA upon awakening Has equipment; has not repeat PSG since [facility]

## 2022-08-31 NOTE — Telephone Encounter (Signed)
Medication Refill - Medication: diazepam (VALIUM) 5 MG tablet   Has the patient contacted their pharmacy? No.   Preferred Pharmacy (with phone number or street name):  [id number] [place] [address] Phone: [phone]  Fax: [phone]     Has the patient been seen for an appointment in the last year OR does the patient have an upcoming appointment? Yes.    Agent: Please be advised that RX refills may take up to 3 business days. We ask that you follow-up with your pharmacy.

## 2022-08-31 NOTE — Telephone Encounter (Signed)
Requested medication (s) are due for refill today: yes  Requested medication (s) are on the active medication list: yes  Last refill:  07/23/19 #30 5 RF  Future visit scheduled: yes  Notes to clinic:  med not delegated to NT to reorder   Requested Prescriptions  Pending Prescriptions Disp Refills   diazepam (VALIUM) 5 MG tablet 30 tablet 5     Not Delegated - Psychiatry: Anxiolytics/Hypnotics 2 Failed - 08/31/2022 12:10 PM      Failed - This refill cannot be delegated      Failed - Urine Drug Screen completed in last 360 days      Passed - Patient is not pregnant      Passed - Valid encounter within last 6 months    Recent Outpatient Visits           2 months ago Annual physical exam   [facility] Merita Norton T, FNP   5 months ago Rash and nonspecific skin eruption   [facility] Jacky Kindle, FNP   1 year ago Annual physical exam   [facility] Merita Norton T, FNP   1 year ago Type 2 diabetes mellitus with hyperglycemia, without long-term current use of insulin [facility])   [facility] [place], [place], [place]   1 year ago Type 2 diabetes mellitus with hyperglycemia, without long-term current use of insulin [facility])   [facility] [place], [place], [place]       Future Appointments             Today Jacky Kindle, FNP [facility]   In 1 week Jacky Kindle, FNP [facility]

## 2022-08-31 NOTE — Telephone Encounter (Signed)
Message from [place] sent at 08/31/2022 11:53 AM EST  Summary: headache   Pt states she has had a periodic headache for a "few weeks"  Pt requesting a Rx for flexeril  Please fu w/ pt         Chief Complaint: daily headaches Symptoms: neck, jaw pain, moderate pain, teeth pain Frequency: 3 weeks Pertinent Negatives: Patient denies fever, stiff neck, eye pain, sore throat, cold sx Disposition: [] ED /[] Urgent Care (no appt availability in office) / [x] Appointment(In office/virtual)/ []  [place] [facility]/ [] Home Care/ [] Refused Recommended Disposition /[] [place] [facility]/ []  Follow-up with PCP Additional Notes: n/a  Reason for Disposition  [1] MODERATE headache (e.g., interferes with normal activities) AND [2] present > 24 hours AND [3] unexplained  (Exceptions: analgesics not tried, typical migraine, or headache part of viral illness)  Answer Assessment - Initial Assessment Questions 1. LOCATION: "Where does it hurt?"      Front/temple/neck 2. ONSET: "When did the headache start?" (Minutes, hours or days)      2-3 weeks every day  3. PATTERN: "Does the pain come and go, or has it been constant since it started?"     Comes and goes 4. SEVERITY: "How bad is the pain?" and "What does it keep you from doing?"  (e.g., Scale 1-10; mild, moderate, or severe)   - MILD (1-3): doesn't interfere with normal activities    - MODERATE (4-7): interferes with normal activities or awakens from sleep    - SEVERE (8-10): excruciating pain, unable to do any normal activities        moderate 5. RECURRENT SYMPTOM: "Have you ever had headaches before?" If Yes, ask: "When was the last time?" and "What happened that time?"      Yes meds  6. CAUSE: "What do you think is causing the headache?"     Stress school, job,  7. MIGRAINE: "Have you been diagnosed with migraine headaches?" If Yes, ask: "Is this headache similar?"      no 8. HEAD INJURY: "Has there been any recent injury to the  head?"      no 9. OTHER SYMPTOMS: "Do you have any other symptoms?" (fever, stiff neck, eye pain, sore throat, cold symptoms)     Neck pain, teeth hurt, jaws 10. PREGNANCY: "Is there any chance you are pregnant?" "When was your last menstrual period?"       N/a  Protocols used: Headache-A-AH

## 2022-08-31 NOTE — Assessment & Plan Note (Addendum)
Bilateral neck pain in setting of acute stressors, concurrent bruxism and headaches  Encouraged chest openers with use of vertical surface like wall or use of rolled towel under back to allow shoulders to relax

## 2022-09-08 ENCOUNTER — Ambulatory Visit: Payer: BC Managed Care – PPO | Admitting: Family Medicine

## 2022-12-23 ENCOUNTER — Ambulatory Visit: Payer: BC Managed Care – PPO | Admitting: Physician Assistant

## 2022-12-30 ENCOUNTER — Other Ambulatory Visit: Payer: Self-pay | Admitting: Family Medicine

## 2022-12-31 NOTE — Telephone Encounter (Signed)
Requested Prescriptions  Pending Prescriptions Disp Refills   buPROPion (WELLBUTRIN XL) 150 MG 24 hr tablet [Pharmacy Med Name: BUPROPION HCL XL 150 MG TABLET] 180 tablet 0    Sig: TAKE 2 TABLETS BY MOUTH DAILY.     Psychiatry: Antidepressants - bupropion Passed - 12/30/2022  3:55 PM      Passed - Cr in normal range and within 360 days    Creatinine, Ser  Date Value Ref Range Status  07/02/2022 0.81 0.57 - 1.00 mg/dL Final         Passed - AST in normal range and within 360 days    AST  Date Value Ref Range Status  07/02/2022 17 0 - 40 IU/L Final         Passed - ALT in normal range and within 360 days    ALT  Date Value Ref Range Status  07/02/2022 15 0 - 32 IU/L Final         Passed - Last BP in normal range    BP Readings from Last 1 Encounters:  08/31/22 133/80         Passed - Valid encounter within last 6 months    Recent Outpatient Visits           4 months ago Chronic tension-type headache, not intractable   [place] Gwyneth Sprout, FNP   6 months ago Annual physical exam   [facility] Tally Joe T, FNP   9 months ago Rash and nonspecific skin eruption   [place] Gwyneth Sprout, FNP   1 year ago Annual physical exam   [facility] Tally Joe T, FNP   2 years ago Type 2 diabetes mellitus with hyperglycemia, without long-term current use of insulin [facility])   [place], [place], [place]

## 2023-04-06 ENCOUNTER — Other Ambulatory Visit: Payer: Self-pay | Admitting: Family Medicine

## 2023-04-06 DIAGNOSIS — G44229 Chronic tension-type headache, not intractable: Secondary | ICD-10-CM

## 2023-04-06 DIAGNOSIS — F458 Other somatoform disorders: Secondary | ICD-10-CM

## 2023-04-06 NOTE — Telephone Encounter (Signed)
Requested medication (s) are due for refill today: yes  Requested medication (s) are on the active medication list: yes  Last refill:  both meds last reordered 08/31/22  Future visit scheduled: no  Notes to clinic:  neither med not delegated to NT to RF   Requested Prescriptions  Pending Prescriptions Disp Refills   diazepam (VALIUM) 2 MG tablet [Pharmacy Med Name: DIAZEPAM 2 MG TABLET] 90 tablet     Sig: TAKE 1-2 TABLETS AS NEEDED FOR HEADACHE RELIEF. REPEAT 6-8 HOURS, MAX OF 10 MG A DAY.     Not Delegated - Psychiatry: Anxiolytics/Hypnotics 2 Failed - 04/06/2023  2:14 AM      Failed - This refill cannot be delegated      Failed - Urine Drug Screen completed in last 360 days      Failed - Valid encounter within last 6 months    Recent Outpatient Visits           7 months ago Chronic tension-type headache, not intractable   [place] [facility] Jacky Kindle, FNP   9 months ago Annual physical exam   [facility] Merita Norton T, FNP   1 year ago Rash and nonspecific skin eruption   [facility] Jacky Kindle, FNP   1 year ago Annual physical exam   [facility] Merita Norton T, FNP   2 years ago Type 2 diabetes mellitus with hyperglycemia, without long-term current use of insulin [facility])   [place] [facility] [place], [place], [place]              Passed - Patient is not pregnant       cyclobenzaprine (FLEXERIL) 10 MG tablet [Pharmacy Med Name: CYCLOBENZAPRINE 10 MG TABLET] 30 tablet 0    Sig: TAKE 1 TABLET BY MOUTH EVERYDAY AT BEDTIME     Not Delegated - Analgesics:  Muscle Relaxants Failed - 04/06/2023  2:14 AM      Failed - This refill cannot be delegated      Failed - Valid encounter within last 6 months    Recent Outpatient Visits           7 months ago Chronic tension-type headache, not intractable   [facility]  Jacky Kindle, FNP   9 months ago Annual physical exam   [facility] Merita Norton T, FNP   1 year ago Rash and nonspecific skin eruption   [place] [facility] Jacky Kindle, FNP   1 year ago Annual physical exam   [facility] Merita Norton T, FNP   2 years ago Type 2 diabetes mellitus with hyperglycemia, without long-term current use of insulin [facility])   [facility] [place], [place], [place]

## 2023-04-07 ENCOUNTER — Other Ambulatory Visit: Payer: Self-pay | Admitting: Family Medicine

## 2023-04-08 NOTE — Telephone Encounter (Signed)
Patient needs OV for additional refills, will refill for 30 days until OV can be scheduled.  Requested Prescriptions  Pending Prescriptions Disp Refills   buPROPion (WELLBUTRIN XL) 150 MG 24 hr tablet [Pharmacy Med Name: BUPROPION HCL XL 150 MG TABLET] 60 tablet 0    Sig: TAKE 2 TABLETS BY MOUTH EVERY DAY     Psychiatry: Antidepressants - bupropion Failed - 04/07/2023  2:43 AM      Failed - Valid encounter within last 6 months    Recent Outpatient Visits           7 months ago Chronic tension-type headache, not intractable   [place] [facility] Merita Norton T, FNP   9 months ago Annual physical exam   [facility] Mer[facility] Norton T, FNP   1 year ago Rash and nonspecific skin eruption   [place] [facility] Merita Norton T, FNP   1 year ago Annual physical exam   [place] [facility] Merita Norton T, FNP   2 years ago Type 2 diabetes mellitus with hyperglycemia, without long-term current use of insulin [facility])   [place] [facility] Joycelyn Man [place], [place]              Passed - Cr in normal range and within 360 days    Creatinine, Ser  Date Value Ref Range Status  07/02/2022 0.81 0.57 - 1.00 mg/dL Final         Passed - AST in normal range and within 360 days    AST  Date Value Ref Range Status  07/02/2022 17 0 - 40 IU/L Final         Passed - ALT in normal range and within 360 days    ALT  Date Value Ref Range Status  07/02/2022 15 0 - 32 IU/L Final         Passed - Last BP in normal range    BP Readings from Last 1 Encounters:  08/31/22 133/80

## 2023-04-11 ENCOUNTER — Encounter (HOSPITAL_COMMUNITY): Payer: Self-pay | Admitting: *Deleted

## 2023-05-02 ENCOUNTER — Ambulatory Visit: Payer: Self-pay | Admitting: *Deleted

## 2023-05-02 ENCOUNTER — Ambulatory Visit: Payer: Self-pay | Admitting: Family Medicine

## 2023-05-02 ENCOUNTER — Emergency Department
Admission: EM | Admit: 2023-05-02 | Discharge: 2023-05-02 | Disposition: A | Discharge status: Home / Self Care | Payer: Self-pay | Attending: Emergency Medicine | Admitting: Emergency Medicine

## 2023-05-02 ENCOUNTER — Other Ambulatory Visit: Payer: Self-pay

## 2023-05-02 ENCOUNTER — Emergency Department: Payer: Self-pay

## 2023-05-02 DIAGNOSIS — I10 Essential (primary) hypertension: Secondary | ICD-10-CM | POA: Insufficient documentation

## 2023-05-02 DIAGNOSIS — E119 Type 2 diabetes mellitus without complications: Secondary | ICD-10-CM | POA: Insufficient documentation

## 2023-05-02 DIAGNOSIS — R42 Dizziness and giddiness: Secondary | ICD-10-CM

## 2023-05-02 LAB — CBC
HCT: 44.9 % (ref 36.0–46.0)
Hemoglobin: 15.8 g/dL — ABNORMAL HIGH (ref 12.0–15.0)
MCH: 30.2 pg (ref 26.0–34.0)
MCHC: 35.2 g/dL (ref 30.0–36.0)
MCV: 85.9 fL (ref 80.0–100.0)
Platelets: 239 10*3/uL (ref 150–400)
RBC: 5.23 MIL/uL — ABNORMAL HIGH (ref 3.87–5.11)
RDW: 12 % (ref 11.5–15.5)
WBC: 8.5 10*3/uL (ref 4.0–10.5)
nRBC: 0 % (ref 0.0–0.2)

## 2023-05-02 LAB — BASIC METABOLIC PANEL
Anion gap: 7 (ref 5–15)
BUN: 11 mg/dL (ref 6–20)
CO2: 28 mmol/L (ref 22–32)
Calcium: 9 mg/dL (ref 8.9–10.3)
Chloride: 102 mmol/L (ref 98–111)
Creatinine, Ser: 0.71 mg/dL (ref 0.44–1.00)
GFR, Estimated: >60 mL/min (ref >60)
Glucose, Bld: 92 mg/dL (ref 70–99)
Potassium: 4.6 mmol/L (ref 3.5–5.1)
Sodium: 137 mmol/L (ref 135–145)

## 2023-05-02 MED ORDER — ONDANSETRON 4 MG PO TBDP: 4.0000 mg | ORAL_TABLET | Freq: Three times a day (TID) | ORAL | 0 refills | Status: DC | PRN

## 2023-05-02 MED ORDER — HYDROCHLOROTHIAZIDE 25 MG PO TABS: 25.0000 mg | ORAL_TABLET | Freq: Every day | ORAL | 1 refills | Status: DC

## 2023-05-02 MED ORDER — HYDROCHLOROTHIAZIDE 25 MG PO TABS
25.0000 mg | ORAL_TABLET | Freq: Once | ORAL | Status: AC
  Administered 2023-05-02: 25 mg via ORAL
  Filled 2023-05-02: qty 1

## 2023-05-02 MED ORDER — MECLIZINE HCL 25 MG PO TABS: 25.0000 mg | ORAL_TABLET | Freq: Three times a day (TID) | ORAL | 0 refills | Status: DC | PRN

## 2023-05-02 NOTE — ED Provider Notes (Signed)
[facility] Provider Note    Event Date/Time   First MD Initiated Contact with Patient 05/02/23 1108     (approximate)   History   Dizziness and Headache   HPI  VAUGHN FRIEZE is a 47 y.o. female with a history of hypertension, gastric sleeve surgery, diabetes who presents with complaints of vertigo symptoms over the last week which have been intermittent as well as a headache for about 3 weeks which she describes as in the afternoon primarily.  She thinks this may be related to her blood pressure.  She reports prior to gastric sleeve surgery she required medication for high blood pressure but then after the surgery she no longer required medication.  No neurodeficits, no changes in vision     Physical Exam   Triage Vital Signs: ED Triage Vitals  Encounter Vitals Group     BP 05/02/23 1011 (!) 170/101     Systolic BP Percentile --      Diastolic BP Percentile --      Pulse Rate 05/02/23 1011 84     Resp 05/02/23 1011 16     Temp 05/02/23 1011 98.4 F (36.9 C)     Temp src --      SpO2 05/02/23 1011 100 %     Weight 05/02/23 1014 101.2 kg (223 lb)     Height 05/02/23 1014 1.549 m (5\' 1" )     Head Circumference --      Peak Flow --      Pain Score 05/02/23 1013 2     Pain Loc --      Pain Education --      Exclude from Growth Chart --     Most recent vital signs: Vitals:   05/02/23 1200 05/02/23 1312  BP: (!) 154/78 (!) 144/78  Pulse: 71 72  Resp:  16  Temp:  98.3 F (36.8 C)  SpO2: 97% 98%     General: Awake, no distress.  CV:  Good peripheral perfusion.  Resp:  Normal effort.  Abd:  No distention.  Other:     ED Results / Procedures / Treatments   Labs (all labs ordered are listed, but only abnormal results are displayed) Labs Reviewed  CBC - Abnormal; Notable for the following components:      Result Value   RBC 5.23 (*)    Hemoglobin 15.8 (*)    All other components within normal limits  BASIC METABOLIC PANEL      EKG  ED ECG REPORT I, Jene Every, the attending physician, personally viewed and interpreted this ECG.  Date: 05/02/2023  Rhythm: normal sinus rhythm QRS Axis: normal Intervals: normal ST/T Wave abnormalities: normal Narrative Interpretation: no evidence of acute ischemia    RADIOLOGY     PROCEDURES:  Critical Care performed:   Procedures   MEDICATIONS ORDERED IN ED: Medications  hydrochlorothiazide (HYDRODIURIL) tablet 25 mg (25 mg Oral Given 05/02/23 1143)     IMPRESSION / MDM / ASSESSMENT AND PLAN / ED COURSE  I reviewed the triage vital signs and the nursing notes. Patient's presentation is most consistent with acute presentation with potential threat to life or bodily function.  Patient presents with daily headaches as detailed above, intermittent dizziness described more as a vertigo-like symptoms.  She reports she has had vertigo in the past and this feels similar.  She also reports she frequently has tension headaches however these headaches have been somewhat different.  No neurodeficits   Will  send for CT imaging to rule out sinusitis, mass which is unlikely  Will start on hydrochlorothiazide, she was on this prior to gastric sleeve surgery  CT scan is reassuring, blood pressure has improved after hydrochlorothiazide, prescription provided for 3 months, appropriate for discharge at this time with outpatient follow-up with PCP for blood pressure recheck      FINAL CLINICAL IMPRESSION(S) / ED DIAGNOSES   Final diagnoses:  Vertigo  Uncontrolled hypertension     Rx / DC Orders   ED Discharge Orders          Ordered    hydrochlorothiazide (HYDRODIURIL) 25 MG tablet  Daily        05/02/23 1304    ondansetron (ZOFRAN-ODT) 4 MG disintegrating tablet  Every 8 hours PRN        05/02/23 1304    meclizine (ANTIVERT) 25 MG tablet  3 times daily PRN        05/02/23 1304             Note:  This document was prepared using Dragon voice  recognition software and may include unintentional dictation errors.   Jene Every, MD 05/02/23 1316

## 2023-05-02 NOTE — Telephone Encounter (Signed)
  Chief Complaint: elevated BP  Symptoms: headache over weekend. Comes and goes but always feels some pain in head. Dizziness weak feeling and s with standing. Last night BP 165/99 this am 156/98. Patient at work now and unable to check BP Frequency: over weekend  Pertinent Negatives: Patient denies chest pain no difficulty breathing no N/V no blurred vision  Disposition: [] ED /[] Urgent Care (no appt availability in office) / [x] Appointment(In office/virtual)/ []  [place] [facility]/ [] Home Care/ [] Refused Recommended Disposition /[] [place] [facility]/ []  Follow-up with PCP Additional Notes:   Appt scheduled today . Recommended if BP elevates or sx worsen go to ED. Recommended someone drive her to appt.     Reason for Disposition  [1] Systolic BP  >= 130 OR Diastolic >= 80 AND [2] not taking BP medications  Answer Assessment - Initial Assessment Questions 1. BLOOD PRESSURE: "What is the blood pressure?" "Did you take at least two measurements 5 minutes apart?"     At work now no access to checking BP . Last night BP 165/99 and this am 156/98 2. ONSET: "When did you take your blood pressure?"     Last night and this am  3. HOW: "How did you take your blood pressure?" (e.g., automatic home BP monitor, visiting nurse)     Automatic BP home monitor  4. HISTORY: "Do you have a history of high blood pressure?"     Yes , did not have to continued BP meds greater than 3 years ago  5. MEDICINES: "Are you taking any medicines for blood pressure?" "Have you missed any doses recently?"     No  6. OTHER SYMPTOMS: "Do you have any symptoms?" (e.g., blurred vision, chest pain, difficulty breathing, headache, weakness)     Headache comes and goes with intensity worse this weekend. Dizziness, weak when standing. Change head position causes dizziness.  7. PREGNANCY: "Is there any chance you are pregnant?" "When was your last menstrual period?"     na  Protocols used: Blood Pressure -  High-A-AH

## 2023-05-02 NOTE — ED Triage Notes (Signed)
Pt to ED for h/a and dizziness for one week. Hx h/a

## 2023-05-23 ENCOUNTER — Other Ambulatory Visit: Payer: Self-pay | Admitting: Family Medicine

## 2023-06-27 ENCOUNTER — Encounter: Payer: Self-pay | Admitting: Family Medicine

## 2023-06-28 ENCOUNTER — Other Ambulatory Visit: Payer: Self-pay | Admitting: Family Medicine

## 2023-07-07 ENCOUNTER — Ambulatory Visit (INDEPENDENT_AMBULATORY_CARE_PROVIDER_SITE_OTHER): Payer: No Typology Code available for payment source | Admitting: Family Medicine

## 2023-07-07 VITALS — BP 132/73 | HR 80 | Ht 61.0 in | Wt 230.0 lb

## 2023-07-07 DIAGNOSIS — Z0001 Encounter for general adult medical examination with abnormal findings: Secondary | ICD-10-CM

## 2023-07-07 DIAGNOSIS — Z1231 Encounter for screening mammogram for malignant neoplasm of breast: Secondary | ICD-10-CM

## 2023-07-07 DIAGNOSIS — Z23 Encounter for immunization: Secondary | ICD-10-CM | POA: Diagnosis not present

## 2023-07-07 DIAGNOSIS — M793 Panniculitis, unspecified: Secondary | ICD-10-CM

## 2023-07-07 DIAGNOSIS — E119 Type 2 diabetes mellitus without complications: Secondary | ICD-10-CM

## 2023-07-07 DIAGNOSIS — Z9884 Bariatric surgery status: Secondary | ICD-10-CM

## 2023-07-07 DIAGNOSIS — N959 Unspecified menopausal and perimenopausal disorder: Secondary | ICD-10-CM

## 2023-07-07 DIAGNOSIS — E11A Type 2 diabetes mellitus without complications in remission: Secondary | ICD-10-CM

## 2023-07-07 DIAGNOSIS — F339 Major depressive disorder, recurrent, unspecified: Secondary | ICD-10-CM

## 2023-07-07 DIAGNOSIS — Z0000 Encounter for general adult medical examination without abnormal findings: Secondary | ICD-10-CM

## 2023-07-07 MED ORDER — BUPROPION HCL ER (XL) 300 MG PO TB24: 300.0000 mg | ORAL_TABLET | Freq: Every day | ORAL | 3 refills | Status: DC

## 2023-07-07 MED ORDER — CITALOPRAM HYDROBROMIDE 40 MG PO TABS: 40.0000 mg | ORAL_TABLET | Freq: Every day | ORAL | 3 refills | Status: DC

## 2023-07-07 MED ORDER — PANTOPRAZOLE SODIUM 40 MG PO TBEC: 40.0000 mg | DELAYED_RELEASE_TABLET | Freq: Every day | ORAL | 3 refills | Status: DC

## 2023-07-07 NOTE — Assessment & Plan Note (Signed)

## 2023-07-07 NOTE — Assessment & Plan Note (Signed)
Due for screening for mammogram, denies breast concerns, provided with phone number to call and schedule appointment for mammogram. Encouraged to repeat breast cancer screening every 1-2 years.  

## 2023-07-07 NOTE — Progress Notes (Signed)
Complete physical exam  Patient: Julia Lynch   DOB: [birth year]   47 y.o. Female  MRN: [id number] Visit Date: 07/07/2023  Today's healthcare provider: Jacky Kindle, FNP  Re-introduced to nurse practitioner role and practice setting.  All questions answered.  Discussed provider/patient relationship and expectations.  Chief Complaint  Patient presents with   Annual Exam    Diet - General diet but tries to consume higher protein diet Exercise - 3 times a week for one hour consistening of at least 30 minutes of cario Feeling - well Sleeping - fairly well Concerns- If bp medication needed, weight gain int he last week in a half 222 lb to 230 lb, as well as fatigue ness so wondering if maybe she is going through menopause   Subjective     HPI     Annual Exam    Additional comments: Diet - General diet but tries to consume higher protein diet Exercise - 3 times a week for one hour consistening of at least 30 minutes of cario Feeling - well Sleeping - fairly well Concerns- If bp medication needed, weight gain int he last week in a half 222 lb to 230 lb, as well as fatigue ness so wondering if maybe she is going through menopause      Last edited by Acey Lav, CMA on 07/07/2023  3:17 PM.      Past Medical History:  Diagnosis Date   Anxiety    Arthritis    Asthma 2006   h/o    Asthma 09/26/2015   Benign essential HTN 10/17/2015   Depression    Diabetic acidosis, type II (HCC)    GERD (gastroesophageal reflux disease)    Headache    History of chicken pox    History of gestational diabetes 01/13/2009   Insulin requiring during pregnancies   History of kidney stones    h/o   History of postpartum depression 05/20/2017   Hypertension    PCOS (polycystic ovarian syndrome)    Polycystic ovaries 10/11/1998   Sleep apnea    uses cpap   Vitamin D deficiency disease    Past Surgical History:  Procedure Laterality Date   ENDOMETRIAL ABLATION Bilateral 03/07/2020    Procedure: ENDOMETRIAL ABLATION;  Surgeon: Hildred Laser, MD;  Location: [facility]S;  Service: Gynecology;  Laterality: Bilateral;   FRACTURE SURGERY  1995   Exploratory surgery on ankle   HYSTEROSCOPY WITH D & C Bilateral 03/07/2020   Procedure: DILATATION AND CURETTAGE /HYSTEROSCOPY;  Surgeon: Hildred Laser, MD;  Location: [facility];  Service: Gynecology;  Laterality: Bilateral;   LAPAROSCOPIC GASTRIC SLEEVE RESECTION  08/22/2020   [facility]ay[facility] Adu, [facility]   left ankle repair  10/11/1993   UPPER GI ENDOSCOPY N/A 09/01/2020   Procedure: UPPER GI ENDOSCOPY;  Surgeon: Gaynelle Adu, MD;  Location: [facility];  Service: General;  Laterality: N/A;   Social History   Socioeconomic History   Marital status: Married    Spouse name: Not on file   Number of children: 3   Years of education: Not on file   Highest education level: Some college, no degree  Occupational History   Not on file  Tobacco Use   Smoking status: Former    Current packs/day: 0.00    Average packs/day: 0.5 packs/day for 10.0 years (5.0 ttl pk-yrs)    Types: Cigarettes    Start date: 10/11/1994    Quit date: 03/17/2005    Years since quitting: 18.3  Smokeless tobacco: Never   Tobacco comments:    Smoke free for 18 years  Vaping Use   Vaping status: Never Used  Substance and Sexual Activity   Alcohol use: Not Currently   Drug use: No   Sexual activity: Yes    Birth control/protection: Other-see comments    Comment: Husband had vasectomy  Other Topics Concern   Not on file  Social History Narrative   Not on file   Social Determinants of Health   Financial Resource Strain: Low Risk  (07/07/2023)   Overall Financial Resource Strain (CARDIA)    Difficulty of Paying Living Expenses: Not very hard  Food Insecurity: No Food Insecurity (07/07/2023)   Hunger Vital Sign    Worried About Running Out of Food in the Last Year: Never true    Ran Out of Food in the Last Year: Never true  Transportation Needs: No  Transportation Needs (07/07/2023)   PRAPARE - Administrator, Civil Service (Medical): No    Lack of Transportation (Non-Medical): No  Physical Activity: Insufficiently Active (07/07/2023)   Exercise Vital Sign    Days of Exercise per Week: 3 days    Minutes of Exercise per Session: 40 min  Stress: No Stress Concern Present (07/07/2023)   [employer] of Occupational Health - Occupational Stress Questionnaire    Feeling of Stress : Only a little  Social Connections: Moderately Integrated (07/07/2023)   Social Connection and Isolation Panel [NHANES]    Frequency of Communication with Friends and Family: Three times a week    Frequency of Social Gatherings with Friends and Family: Once a week    Attends Religious Services: 1 to 4 times per year    Active Member of [employer] or Organizations: No    Attends Engineer, structural: Not on file    Marital Status: Married  Catering manager Violence: Not on file   Family Status  Relation Name Status   Father Ralene Muskrat Alive   Mother Beverley Fiedler Alive   MGF  Deceased at age 46   PGM Hulda Marin Alive   PGF  Deceased       Lougerrits disease   Son Zaryn Wheatcraft (Not Specified)   Son Audiana Gormly (Not Specified)   Son Yuriko Orzech (Not Specified)  No partnership data on file   Family History  Problem Relation Age of Onset   Diabetes Father    Hypertension Father    Obesity Father    Hepatitis C Mother    Anxiety disorder Mother    Arthritis Mother    Diabetes Mother    Drug abuse Mother    Hypertension Mother    Miscarriages / [place] Mother    Heart attack Maternal Grandfather    Hypertension Paternal Grandmother    Diabetes Paternal Grandmother    ADD / ADHD Son    Anxiety disorder Son    Learning disabilities Son    ADD / ADHD Son    Anxiety disorder Son    Asthma Son    Allergies  Allergen Reactions   Ibuprofen     Other reaction(s): Other (See Comments) GI Upset   Lodine  [Etodolac] Hives and Other (See Comments)    GI upset    Patient Care Team: Jacky Kindle, FNP as PCP - General (Family Medicine) Purcell Nails, CNM (Inactive) as Midwife (Obstetrics and Gynecology) Pa, [facility]   Medications: Outpatient Medications Prior to Visit  Medication Sig  CALCIUM CITRATE PO Take 1 each by mouth in the morning, at noon, and at bedtime. Fusion chews 500mg    clobetasol cream (TEMOVATE) 0.05 % APPLY 1 APPLICATION TOPICALLY 2 (TWO) TIMES DAILY. PEA SIZE AMOUNT TO ALL AREAS TWICE DAY.   cyclobenzaprine (FLEXERIL) 10 MG tablet Take 1 tablet (10 mg total) by mouth at bedtime.   diazepam (VALIUM) 2 MG tablet Take 1-2 tablets as needed PO, for headache relief. Repeat 6-8 hours, max of 10 mg a day.   hydrochlorothiazide (HYDRODIURIL) 25 MG tablet Take 1 tablet (25 mg total) by mouth daily.   hydrOXYzine (VISTARIL) 25 MG capsule Take 1-2 capsules as needed, up to 3 times/day for itching. Avoid machinery with use due to drowsiness.   meclizine (ANTIVERT) 25 MG tablet Take 1 tablet (25 mg total) by mouth 3 (three) times daily as needed.   methocarbamol (ROBAXIN-750) 750 MG tablet Take 1 tablet (750 mg total) by mouth 3 (three) times daily.   Multiple Vitamins-Minerals (BARIATRIC MULTIVITAMINS/IRON) CAPS Take by mouth.   ondansetron (ZOFRAN-ODT) 4 MG disintegrating tablet Take 1 tablet (4 mg total) by mouth every 8 (eight) hours as needed (dizziness).   valACYclovir (VALTREX) 500 MG tablet TAKE 1 TABLET (500 MG TOTAL) BY MOUTH DAILY. CAN INCREASE TO TWICE A DAY FOR 5 DAYS IN THE EVENT OF A RECURRENCE   [DISCONTINUED] buPROPion (WELLBUTRIN XL) 150 MG 24 hr tablet Take 2 tablets by mouth once daily   [DISCONTINUED] citalopram (CELEXA) 40 MG tablet TAKE 1 TABLET BY MOUTH EVERY DAY   [DISCONTINUED] pantoprazole (PROTONIX) 40 MG tablet TAKE 1 TABLET BY MOUTH EVERY DAY   [DISCONTINUED] cyclobenzaprine (FLEXERIL) 5 MG tablet Take 1 tablet (5 mg total) by mouth 3  (three) times daily as needed for muscle spasms. (Patient taking differently: Take 5 mg by mouth 3 (three) times daily as needed (Headache).)   [DISCONTINUED] tirzepatide (MOUNJARO) 2.5 MG/0.5ML Pen Inject 2.5 mg into the skin once a week. (Patient not taking: Reported on 08/31/2022)   No facility-administered medications prior to visit.    Objective    BP 132/73 (BP Location: Left Arm, Patient Position: Sitting, Cuff Size: Normal)   Pulse 80   Ht 5\' 1"  (1.549 m)   Wt 230 lb (104.3 kg)   SpO2 100%   BMI 43.46 kg/m   Physical Exam Vitals and nursing note reviewed.  Constitutional:      General: She is awake. She is not in acute distress.    Appearance: Normal appearance. She is well-developed and well-groomed. She is obese. She is not ill-appearing, toxic-appearing or diaphoretic.  HENT:     Head: Normocephalic and atraumatic.     Jaw: There is normal jaw occlusion. No trismus, tenderness, swelling or pain on movement.     Right Ear: Hearing, tympanic membrane, ear canal and external ear normal. There is no impacted cerumen.     Left Ear: Hearing, tympanic membrane, ear canal and external ear normal. There is no impacted cerumen.     Nose: Nose normal. No congestion or rhinorrhea.     Right Turbinates: Not enlarged, swollen or pale.     Left Turbinates: Not enlarged, swollen or pale.     Right Sinus: No maxillary sinus tenderness or frontal sinus tenderness.     Left Sinus: No maxillary sinus tenderness or frontal sinus tenderness.     Mouth/Throat:     Lips: Pink.     Mouth: Mucous membranes are moist. No injury.     Tongue: No lesions.  Pharynx: Oropharynx is clear. Uvula midline. No pharyngeal swelling, oropharyngeal exudate, posterior oropharyngeal erythema or uvula swelling.     Tonsils: No tonsillar exudate or tonsillar abscesses.  Eyes:     General: Lids are normal. Lids are everted, no foreign bodies appreciated. Vision grossly intact. Gaze aligned appropriately. No  allergic shiner or visual field deficit.       Right eye: No discharge.        Left eye: No discharge.     Extraocular Movements: Extraocular movements intact.     Conjunctiva/sclera: Conjunctivae normal.     Right eye: Right conjunctiva is not injected. No exudate.    Left eye: Left conjunctiva is not injected. No exudate.    Pupils: Pupils are equal, round, and reactive to light.  Neck:     Thyroid: No thyroid mass, thyromegaly or thyroid tenderness.     Vascular: No carotid bruit.     Trachea: Trachea normal.  Cardiovascular:     Rate and Rhythm: Normal rate and regular rhythm.     Pulses: Normal pulses.          Carotid pulses are 2+ on the right side and 2+ on the left side.      Radial pulses are 2+ on the right side and 2+ on the left side.       Dorsalis pedis pulses are 2+ on the right side and 2+ on the left side.       Posterior tibial pulses are 2+ on the right side and 2+ on the left side.     Heart sounds: Normal heart sounds, S1 normal and S2 normal. No murmur heard.    No friction rub. No gallop.  Pulmonary:     Effort: Pulmonary effort is normal. No respiratory distress.     Breath sounds: Normal breath sounds and air entry. No stridor. No wheezing, rhonchi or rales.  Chest:     Chest wall: No tenderness.  Abdominal:     General: Abdomen is flat. Bowel sounds are normal. There is no distension.     Palpations: Abdomen is soft. There is no mass.     Tenderness: There is no abdominal tenderness. There is no right CVA tenderness, left CVA tenderness, guarding or rebound.     Hernia: No hernia is present.     Comments: Significant pannus with abdominal excoriation from weight/heat/moisture accumulation   Genitourinary:    Comments: Exam deferred; denies complaints Musculoskeletal:        General: No swelling, tenderness, deformity or signs of injury. Normal range of motion.     Cervical back: Full passive range of motion without pain, normal range of motion and neck  supple. No edema, rigidity or tenderness. No muscular tenderness.     Right lower leg: No edema.     Left lower leg: No edema.  Lymphadenopathy:     Cervical: No cervical adenopathy.     Right cervical: No superficial, deep or posterior cervical adenopathy.    Left cervical: No superficial, deep or posterior cervical adenopathy.  Skin:    General: Skin is warm and dry.     Capillary Refill: Capillary refill takes less than 2 seconds.     Coloration: Skin is not jaundiced or pale.     Findings: Erythema, lesion and rash present. No bruising.     Comments: Significant pannus with abdominal excoriation from weight/heat/moisture accumulation    Neurological:     General: No focal deficit present.  Mental Status: She is alert and oriented to person, place, and time. Mental status is at baseline.     GCS: GCS eye subscore is 4. GCS verbal subscore is 5. GCS motor subscore is 6.     Sensory: Sensation is intact. No sensory deficit.     Motor: Motor function is intact. No weakness.     Coordination: Coordination is intact. Coordination normal.     Gait: Gait is intact. Gait normal.  Psychiatric:        Attention and Perception: Attention and perception normal.        Mood and Affect: Mood and affect normal.        Speech: Speech normal.        Behavior: Behavior normal. Behavior is cooperative.        Thought Content: Thought content normal.        Cognition and Memory: Cognition and memory normal.        Judgment: Judgment normal.     Last depression screening scores    08/31/2022    1:12 PM 07/02/2022    8:49 AM 03/23/2022    1:12 PM  PHQ 2/9 Scores  PHQ - 2 Score 1 2 1   PHQ- 9 Score 4 11 5    Last fall risk screening    08/31/2022    1:12 PM  Fall Risk   Falls in the past year? 0  Number falls in past yr: 0  Injury with Fall? 0  Risk for fall due to : No Fall Risks  Follow up Falls evaluation completed   Last Audit-C alcohol use screening    07/07/2023    9:58 AM   Alcohol Use Disorder Test (AUDIT)  1. How often do you have a drink containing alcohol? 1  2. How many drinks containing alcohol do you have on a typical day when you are drinking? 0  3. How often do you have six or more drinks on one occasion? 0  AUDIT-C Score 1   A score of 3 or more in women, and 4 or more in men indicates increased risk for alcohol abuse, EXCEPT if all of the points are from question 1   No results found for any visits on 07/07/23.  Assessment & Plan    Routine Health Maintenance and Physical Exam  Exercise Activities and Dietary recommendations  Goals   None     Immunization History  Administered Date(s) Administered   Influenza, Seasonal, Injecte, Preservative Fre 07/07/2023   Influenza,inj,Quad PF,6+ Mos 09/26/2015, 08/20/2016, 10/13/2018, 06/18/2020, 07/01/2021, 07/02/2022   Moderna Sars-Covid-2 Vaccination 01/18/2020, 02/21/2020   Tdap 04/20/2012, 07/08/2014    Health Maintenance  Topic Date Due   OPHTHALMOLOGY EXAM  12/09/2019   FOOT EXAM  12/15/2019   MAMMOGRAM  02/02/2022   HEMOGLOBIN A1C  12/31/2022   COVID-19 Vaccine (3 - 2023-24 season) 06/12/2023   Diabetic kidney evaluation - Urine ACR  07/03/2023   Cervical Cancer Screening (HPV/Pap Cotest)  10/14/2023   Diabetic kidney evaluation - eGFR measurement  05/01/2024   DTaP/Tdap/Td (3 - Td or Tdap) 07/08/2024   Fecal DNA (Cologuard)  07/11/2025   INFLUENZA VACCINE  Completed   Hepatitis C Screening  Completed   HIV Screening  Completed   HPV VACCINES  Aged Out    Discussed health benefits of physical activity, and encouraged her to engage in regular exercise appropriate for her age and condition.  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  - Eat a low-fat diet with lots of fruits and vegetables, up to 7-9 servings per day.  - Seatbelts can save your life. Wear them always.  - Smoke detectors on every level of your home, check batteries every year.  - Eye Doctor - have an eye exam every 1-2 years  - Safe sex - if you may be exposed to STDs, use a condom.  - Alcohol -  If you drink, do it moderately, less than 2 drinks per day.  - Health Care Power of Attorney. Choose someone to speak for you if you are not able.  - Depression is common in our stressful world.If you're feeling down or losing interest in things you normally enjoy, please come in for a visit.  - Violence - If anyone is threatening or hurting you, please call immediately.       Depression, recurrent (HCC)   Relevant Medications   citalopram (CELEXA) 40 MG tablet   buPROPion (WELLBUTRIN XL) 300 MG 24 hr tablet   Immunization due   Relevant Orders   Flu vaccine trivalent PF, 6mos and older(Flulaval,Afluria,Fluarix,Fluzone) (Completed)   Influenza vaccine needed   Relevant Orders   Flu vaccine trivalent PF, 6mos and older(Flulaval,Afluria,Fluarix,Fluzone) (Completed)   Menopausal and postmenopausal disorder    Complaints of bloating, change in mood, bowel habits and fatigue Recommend hormonal labs       Relevant Orders   FSH/LH   Estrogens, total   Estradiol   Testosterone,Free and Total   Morbid obesity (HCC)    Acute on chronic, weight gain noted Body mass index is 43.46 kg/m. Continue to recommend balanced, lower carb meals. Smaller meal size, adding snacks. Choosing water as drink of choice and increasing purposeful exercise.        Relevant Orders   CBC with Differential/Platelet   Comprehensive Metabolic Panel (CMET)   TSH   Lipid panel   Personal history of gastric bypass    Upcoming appt with gastric surgery team; wishes to calculate BMR      Relevant Medications   pantoprazole (PROTONIX) 40 MG tablet   Screening mammogram for breast cancer    Due for screening for mammogram, denies breast concerns, provided with phone number to call and schedule appointment for mammogram. Encouraged to repeat breast cancer screening every 1-2 years.       Relevant Orders   MM 3D SCREENING MAMMOGRAM BILATERAL BREAST   Return in about 6 months (around 01/04/2024), or if symptoms worsen or fail to improve, for chonic disease management.    Leilani Merl, FNP, have reviewed all documentation for this visit. The documentation on 07/07/23 for the exam, diagnosis, procedures, and orders are all accurate and complete.  Jacky Kindle, FNP  [facility] [phone] (phone) [phone] (fax)  [facility]

## 2023-07-07 NOTE — Patient Instructions (Signed)
Please call and schedule your mammogram:  [facility]  [address], [address] [facility] [place],  [place]  [address] Get Driving Directions Main: [phone]  Sunday:Closed Monday:7:20 AM - 5:00 PM Tuesday:7:20 AM - 5:00 PM Wednesday:7:20 AM - 5:00 PM Thursday:7:20 AM - 5:00 PM Friday:7:20 AM - 4:30 PM Saturday:Closed  

## 2023-07-07 NOTE — Assessment & Plan Note (Signed)
Upcoming appt with gastric surgery team; wishes to calculate [facility]

## 2023-07-07 NOTE — Assessment & Plan Note (Signed)
Acute on chronic, with recurrent topical infection Continue to monitor with OTC treatment Pt declines need for Rx treatment at this time

## 2023-07-07 NOTE — Assessment & Plan Note (Signed)
Complaints of bloating, change in mood, bowel habits and fatigue Recommend hormonal labs

## 2023-07-07 NOTE — Assessment & Plan Note (Signed)
Acute on chronic, weight gain noted Body mass index is 43.46 kg/m. Continue to recommend balanced, lower carb meals. Smaller meal size, adding snacks. Choosing water as drink of choice and increasing purposeful exercise.

## 2023-07-07 NOTE — Assessment & Plan Note (Signed)
Chronic, in remission Repeat A1c and urine micro to assist with previous history

## 2023-07-08 ENCOUNTER — Other Ambulatory Visit: Payer: Self-pay | Admitting: Family Medicine

## 2023-07-08 MED ORDER — HYDROCHLOROTHIAZIDE 25 MG PO TABS: 25.0000 mg | ORAL_TABLET | Freq: Every day | ORAL | 3 refills | Status: DC

## 2023-07-08 NOTE — Telephone Encounter (Signed)
Medication Refill - Medication: hydrochlorothiazide (HYDRODIURIL) 25 MG tablet   Has the patient contacted their pharmacy? No. No, more refills.   (Agent: If yes, when and what did the pharmacy advise?)  Preferred Pharmacy (with phone number or street name):  [facility] [address]), [place] - [address]  [address]) [place] [address]  Phone: [phone] Fax: [phone]  Hours: Not open 24 hours   Has the patient been seen for an appointment in the last year OR does the patient have an upcoming appointment? Yes.    Agent: Please be advised that RX refills may take up to 3 business days. We ask that you follow-up with your pharmacy.

## 2023-07-08 NOTE — Telephone Encounter (Signed)
Requested medications are due for refill today.  yes  Requested medications are on the active medications list.  yes  Last refill. 05/02/2023 #30 1 rf  Future visit scheduled.   yes  Notes to clinic.  Rx signed by Jene Every.    Requested Prescriptions  Pending Prescriptions Disp Refills   hydrochlorothiazide (HYDRODIURIL) 25 MG tablet 30 tablet 1    Sig: Take 1 tablet (25 mg total) by mouth daily.     Cardiovascular: Diuretics - Thiazide Passed - 07/08/2023 10:26 AM      Passed - Cr in normal range and within 180 days    Creatinine, Ser  Date Value Ref Range Status  07/07/2023 0.90 0.57 - 1.00 mg/dL Final         Passed - K in normal range and within 180 days    Potassium  Date Value Ref Range Status  07/07/2023 4.3 3.5 - 5.2 mmol/L Final         Passed - Na in normal range and within 180 days    Sodium  Date Value Ref Range Status  07/07/2023 138 134 - 144 mmol/L Final         Passed - Last BP in normal range    BP Readings from Last 1 Encounters:  07/07/23 132/73         Passed - Valid encounter within last 6 months    Recent Outpatient Visits           Yesterday Annual physical exam   [facility] Merita Norton T, FNP   10 months ago Chronic tension-type headache, not intractable   [facility] Jacky Kindle, FNP   1 year ago Annual physical exam   [facility] Merita Norton T, FNP   1 year ago Rash and nonspecific skin eruption   [facility] Jacky Kindle, FNP   2 years ago Annual physical exam   [facility] Jacky Kindle, FNP       Future Appointments             In 6 months Jacky Kindle, FNP [facility], [facility]

## 2023-07-10 LAB — LIPID PANEL
Chol/HDL Ratio: 3.5 {ratio} (ref 0.0–4.4)
Cholesterol, Total: 200 mg/dL — ABNORMAL HIGH (ref 100–199)
HDL: 57 mg/dL (ref >39)
LDL Chol Calc (NIH): 101 mg/dL — ABNORMAL HIGH (ref 0–99)
Triglycerides: 249 mg/dL — ABNORMAL HIGH (ref 0–149)
VLDL Cholesterol Cal: 42 mg/dL — ABNORMAL HIGH (ref 5–40)

## 2023-07-10 LAB — CBC WITH DIFFERENTIAL/PLATELET
Basophils Absolute: 0 10*3/uL (ref 0.0–0.2)
Basos: 0 %
EOS (ABSOLUTE): 0.2 10*3/uL (ref 0.0–0.4)
Eos: 2 %
Hematocrit: 45.9 % (ref 34.0–46.6)
Hemoglobin: 15.1 g/dL (ref 11.1–15.9)
Immature Grans (Abs): 0 10*3/uL (ref 0.0–0.1)
Immature Granulocytes: 0 %
Lymphocytes Absolute: 4.3 10*3/uL — ABNORMAL HIGH (ref 0.7–3.1)
Lymphs: 41 %
MCH: 30.4 pg (ref 26.6–33.0)
MCHC: 32.9 g/dL (ref 31.5–35.7)
MCV: 93 fL (ref 79–97)
Monocytes Absolute: 0.9 10*3/uL (ref 0.1–0.9)
Monocytes: 9 %
Neutrophils Absolute: 5 10*3/uL (ref 1.4–7.0)
Neutrophils: 48 %
Platelets: 227 10*3/uL (ref 150–450)
RBC: 4.96 x10E6/uL (ref 3.77–5.28)
RDW: 13.2 % (ref 11.7–15.4)
WBC: 10.4 10*3/uL (ref 3.4–10.8)

## 2023-07-10 LAB — FSH/LH
FSH: 2.6 m[IU]/mL
LH: 9.8 m[IU]/mL

## 2023-07-10 LAB — TSH: TSH: 1.02 u[IU]/mL (ref 0.450–4.500)

## 2023-07-10 LAB — COMPREHENSIVE METABOLIC PANEL
ALT: 18 [IU]/L (ref 0–32)
AST: 19 [IU]/L (ref 0–40)
Albumin: 4.2 g/dL (ref 3.9–4.9)
Alkaline Phosphatase: 63 [IU]/L (ref 44–121)
BUN/Creatinine Ratio: 22 (ref 9–23)
BUN: 20 mg/dL (ref 6–24)
Bilirubin Total: 0.3 mg/dL (ref 0.0–1.2)
CO2: 26 mmol/L (ref 20–29)
Calcium: 9.7 mg/dL (ref 8.7–10.2)
Chloride: 99 mmol/L (ref 96–106)
Creatinine, Ser: 0.9 mg/dL (ref 0.57–1.00)
Globulin, Total: 2.4 g/dL (ref 1.5–4.5)
Glucose: 70 mg/dL (ref 70–99)
Potassium: 4.3 mmol/L (ref 3.5–5.2)
Sodium: 138 mmol/L (ref 134–144)
Total Protein: 6.6 g/dL (ref 6.0–8.5)
eGFR: 80 mL/min/{1.73_m2} (ref >59)

## 2023-07-10 LAB — ESTRADIOL: Estradiol: 161 pg/mL

## 2023-07-10 LAB — HEMOGLOBIN A1C
Est. average glucose Bld gHb Est-mCnc: 117 mg/dL
Hgb A1c MFr Bld: 5.7 % — ABNORMAL HIGH (ref 4.8–5.6)

## 2023-07-10 LAB — ESTROGENS, TOTAL: Estrogen: 309 pg/mL

## 2023-07-10 LAB — MICROALBUMIN / CREATININE URINE RATIO
Creatinine, Urine: 58.5 mg/dL
Microalb/Creat Ratio: <5 mg/g{creat} (ref 0–29)
Microalbumin, Urine: <3 ug/mL

## 2023-07-10 LAB — TESTOSTERONE,FREE AND TOTAL
Testosterone, Free: 6.8 pg/mL — ABNORMAL HIGH (ref 0.0–4.2)
Testosterone: 26 ng/dL (ref 4–50)

## 2023-08-11 ENCOUNTER — Telehealth: Payer: Self-pay

## 2023-08-11 NOTE — Telephone Encounter (Signed)
Copied from CRM [id number]. Topic: General - Other >> Aug 11, 2023  9:24 AM Franchot Heidelberg wrote: Reason for CRM: Pt called reporting that she was advised by mammography to contact her PCP and have her update her mammogram orders stating that the pain behind her nipple is an acute/recent symptom. This way she can be seen sooner.

## 2023-08-11 NOTE — Telephone Encounter (Signed)
Patient reports is the left breast, symptoms present for the past 2-3 weeks and is right behind the nipple lower part of the breast the breast. 6 o'clock.

## 2023-08-12 ENCOUNTER — Other Ambulatory Visit: Payer: Self-pay | Admitting: Family Medicine

## 2023-08-12 DIAGNOSIS — N6459 Other signs and symptoms in breast: Secondary | ICD-10-CM

## 2023-08-12 DIAGNOSIS — Z1231 Encounter for screening mammogram for malignant neoplasm of breast: Secondary | ICD-10-CM

## 2023-08-12 DIAGNOSIS — N644 Mastodynia: Secondary | ICD-10-CM

## 2023-08-17 ENCOUNTER — Ambulatory Visit
Admission: RE | Admit: 2023-08-17 | Discharge: 2023-08-17 | Disposition: A | Discharge status: Home / Self Care | Payer: No Typology Code available for payment source | Source: Ambulatory Visit | Attending: Family Medicine | Admitting: Family Medicine

## 2023-08-17 DIAGNOSIS — N644 Mastodynia: Secondary | ICD-10-CM | POA: Diagnosis present

## 2023-08-17 DIAGNOSIS — Z1231 Encounter for screening mammogram for malignant neoplasm of breast: Secondary | ICD-10-CM | POA: Insufficient documentation

## 2023-08-17 DIAGNOSIS — N6459 Other signs and symptoms in breast: Secondary | ICD-10-CM

## 2023-08-24 ENCOUNTER — Other Ambulatory Visit: Payer: Self-pay

## 2023-12-02 DIAGNOSIS — S39012A Strain of muscle, fascia and tendon of lower back, initial encounter: Secondary | ICD-10-CM | POA: Diagnosis not present

## 2023-12-02 DIAGNOSIS — I10 Essential (primary) hypertension: Secondary | ICD-10-CM | POA: Diagnosis not present

## 2023-12-02 DIAGNOSIS — E119 Type 2 diabetes mellitus without complications: Secondary | ICD-10-CM | POA: Diagnosis not present

## 2023-12-02 DIAGNOSIS — F419 Anxiety disorder, unspecified: Secondary | ICD-10-CM | POA: Diagnosis not present

## 2023-12-02 DIAGNOSIS — M545 Low back pain, unspecified: Secondary | ICD-10-CM | POA: Diagnosis not present

## 2023-12-02 DIAGNOSIS — M549 Dorsalgia, unspecified: Secondary | ICD-10-CM | POA: Diagnosis not present

## 2024-01-04 ENCOUNTER — Ambulatory Visit: Payer: No Typology Code available for payment source | Admitting: Family Medicine

## 2024-06-12 ENCOUNTER — Other Ambulatory Visit: Payer: Self-pay

## 2024-06-12 ENCOUNTER — Telehealth: Payer: Self-pay | Admitting: Family Medicine

## 2024-06-12 MED ORDER — HYDROCHLOROTHIAZIDE 25 MG PO TABS: 25.0000 mg | ORAL_TABLET | Freq: Every day | ORAL | 0 refills | Status: AC

## 2024-06-12 NOTE — Telephone Encounter (Signed)
 [facility] [address] is asking for refills on hydrochlorothiazide  25 mg. # 90 with 3 RF

## 2024-07-13 ENCOUNTER — Telehealth: Payer: Self-pay | Admitting: Family Medicine

## 2024-07-13 NOTE — Telephone Encounter (Signed)
 Not taking new patients, recommend she see Dr. Franchot

## 2024-07-13 NOTE — Telephone Encounter (Signed)
 Copied from CRM [id number]. Topic: Appointments - Scheduling Inquiry for Clinic >> Jul 13, 2024  1:54 PM Donna BRAVO wrote: Reason for CRM: patient was Dr Gasper patient a long time ago and would like to have Dr Gasper as a primary care provider. Please call patient about Dr. Gasper will accept her as a patient.  Patient has no insurance at this time and would like an estimate for appt for physical and office visit medication refill/follow up.   Provided patient with Financial Assistance phone number.  Patient has medications that need refilled.   hydrochlorothiazide  (HYDRODIURIL ) 25 MG tablet  pantoprazole  (PROTONIX ) 40 MG tablet citalopram  (CELEXA ) 40 MG tablet buPROPion  (WELLBUTRIN  XL) 300 MG 24 hr tablet methocarbamol  (ROBAXIN -750) 750 MG tablet  PLEASE call patient back regarding these questions. phone [phone]

## 2024-07-17 ENCOUNTER — Telehealth: Payer: Self-pay

## 2024-08-12 ENCOUNTER — Other Ambulatory Visit: Payer: Self-pay | Admitting: Family Medicine

## 2024-08-14 NOTE — Telephone Encounter (Signed)
 Requested medications are due for refill today.  yes  Requested medications are on the active medications list.  yes  Last refill. 06/12/2024 #30 0 rf  Future visit scheduled.   yes  Notes to clinic.  Labs are expired.    Requested Prescriptions  Pending Prescriptions Disp Refills   hydrochlorothiazide  (HYDRODIURIL ) 25 MG tablet [Pharmacy Med Name: HYDROCHLOROTHIAZIDE  25 MG TAB] 30 tablet 0    Sig: Take 1 tablet (25 mg total) by mouth daily.     Cardiovascular: Diuretics - Thiazide Failed - 08/14/2024 12:06 PM      Failed - Cr in normal range and within 180 days    Creatinine, Ser  Date Value Ref Range Status  07/07/2023 0.90 0.57 - 1.00 mg/dL Final         Failed - K in normal range and within 180 days    Potassium  Date Value Ref Range Status  07/07/2023 4.3 3.5 - 5.2 mmol/L Final         Failed - Na in normal range and within 180 days    Sodium  Date Value Ref Range Status  07/07/2023 138 134 - 144 mmol/L Final         Failed - Valid encounter within last 6 months    Recent Outpatient Visits   None            Passed - Last BP in normal range    BP Readings from Last 1 Encounters:  07/07/23 132/73

## 2024-08-20 ENCOUNTER — Encounter: Admitting: Physician Assistant

## 2024-09-20 ENCOUNTER — Encounter: Payer: Self-pay | Admitting: Internal Medicine

## 2024-09-20 ENCOUNTER — Ambulatory Visit: Payer: Self-pay | Admitting: Internal Medicine

## 2024-09-20 ENCOUNTER — Other Ambulatory Visit: Payer: Self-pay

## 2024-09-20 VITALS — BP 120/70 | HR 73 | Temp 98.1°F | Resp 16 | Ht 61.0 in | Wt 207.6 lb

## 2024-09-20 DIAGNOSIS — Z23 Encounter for immunization: Secondary | ICD-10-CM

## 2024-09-20 DIAGNOSIS — Z1231 Encounter for screening mammogram for malignant neoplasm of breast: Secondary | ICD-10-CM

## 2024-09-20 DIAGNOSIS — E1165 Type 2 diabetes mellitus with hyperglycemia: Secondary | ICD-10-CM

## 2024-09-20 DIAGNOSIS — K219 Gastro-esophageal reflux disease without esophagitis: Secondary | ICD-10-CM

## 2024-09-20 DIAGNOSIS — Z7985 Long-term (current) use of injectable non-insulin antidiabetic drugs: Secondary | ICD-10-CM

## 2024-09-20 DIAGNOSIS — G44229 Chronic tension-type headache, not intractable: Secondary | ICD-10-CM

## 2024-09-20 DIAGNOSIS — Z9884 Bariatric surgery status: Secondary | ICD-10-CM

## 2024-09-20 DIAGNOSIS — Z13220 Encounter for screening for lipoid disorders: Secondary | ICD-10-CM

## 2024-09-20 DIAGNOSIS — L309 Dermatitis, unspecified: Secondary | ICD-10-CM

## 2024-09-20 DIAGNOSIS — I10 Essential (primary) hypertension: Secondary | ICD-10-CM

## 2024-09-20 DIAGNOSIS — F339 Major depressive disorder, recurrent, unspecified: Secondary | ICD-10-CM

## 2024-09-20 MED ORDER — METHOCARBAMOL 750 MG PO TABS: 750.0000 mg | ORAL_TABLET | Freq: Three times a day (TID) | ORAL | 1 refills | Status: AC | PRN

## 2024-09-20 MED ORDER — CITALOPRAM HYDROBROMIDE 40 MG PO TABS: 40.0000 mg | ORAL_TABLET | Freq: Every day | ORAL | 1 refills | Status: AC

## 2024-09-20 MED ORDER — PANTOPRAZOLE SODIUM 40 MG PO TBEC: 40.0000 mg | DELAYED_RELEASE_TABLET | Freq: Every day | ORAL | 1 refills | Status: AC

## 2024-09-20 NOTE — Progress Notes (Signed)
 New Patient Office Visit  Subjective    Patient ID: BREANN LOSANO, female    DOB: 08/22/1976  Age: 48 y.o. MRN: [id number]  CC:  Chief Complaint  Patient presents with   Establish Care    HPI LINDAY RHODES presents to establish care.  Discussed the use of AI scribe software for clinical note transcription with the patient, who gave verbal consent to proceed.  History of Present Illness Rufus Cypert Cailah Reach is a 48 year old female who presents for a follow-up visit.  She has hypertension with prior severe elevation to 167/165 in July 2024 associated with headache and an ER visit. She restarted HCTZ then and remained on it with BP 143/87 in September 2024. She ran out of HCTZ on August 17, 2024 and has been off it since. She denies headache, blurred vision, or chest pressure since stopping it.  She has type 2 diabetes that has been in the prediabetic range. After gastric sleeve surgery she regained weight to 230 lb by June 2025 and started tirzepatide  2.5 mg weekly from a gray market source. She is now 207 lb. She notes increased sugar cravings and hair loss similar to after surgery. Her last A1c was 5.7% about a year ago.  She stopped Wellbutrin  300 mg a month ago, and since then feels more fatigued, more irritable, and has more difficulty with focus and procrastination while in school. Her family has noticed she is more short-tempered. She has been taking citalopram  20 mg instead of her usual 40 mg to stretch the supply and feels this may be worsening her mood.  She gets marked sedation with Flexeril  even at 5 mg and prefers methocarbamol , which she can take and still work. She takes pantoprazole  for acid reflux, which she needs to control her symptoms.   Hx of HTN: -Had elevated blood pressure diagnosed in 2017, had gastric sleeve in 2022 and stopped the hydrochlorothiazide  she had been on.  -Was put back on it earlier this year after an episode in the  ER  Diabetes, Type 2: -Last A1c 9/24 5.7%, it had been more uncontrolled in 2021  -Medications: Mounjaro  2.5 weekly - paying out of pocket through a compounding pharmacy, lost about 30 pounds so far on it  -Patient is compliant with the above medications and reports no side effects.  -Eye exam: Will call to schedule -Foot exam: Today -Microalbumin: Today -Statin: No -PNA vaccine: No -Denies symptoms of hypoglycemia, polyuria, polydipsia, numbness extremities, foot ulcers/trauma.   Inattention/MDD: -Mood status: worse -Current treatment: Celexa  20 mg, had been on Wellbutrin  300 XL but ran out -Satisfied with current treatment?: no -Duration of current treatment : months -Side effects: no Medication compliance: excellent compliance     09/20/2024    1:30 PM 08/31/2022    1:12 PM 07/02/2022    8:49 AM 03/23/2022    1:12 PM 07/01/2021   10:11 AM  Depression screen PHQ 2/9  Decreased Interest 0 1 1 1 1   Down, Depressed, Hopeless 0 0 1 0 0  PHQ - 2 Score 0 1 2 1 1   Altered sleeping  1 0 0 0  Tired, decreased energy  1 3 2 1   Change in appetite  1 2 1  0  Feeling bad or failure about yourself   0 1 0 0  Trouble concentrating  0 3 1 0  Moving slowly or fidgety/restless  0 0 0 0  Suicidal thoughts  0 0 0 0  PHQ-9 Score  4  11  5  2    Difficult doing work/chores  Somewhat difficult Very difficult Somewhat difficult Not difficult at all     Data saved with a previous flowsheet row definition     Outpatient Encounter Medications as of 09/20/2024  Medication Sig   CALCIUM  CITRATE PO Take 1 each by mouth in the morning, at noon, and at bedtime. Fusion chews 500mg    citalopram  (CELEXA ) 40 MG tablet Take 1 tablet (40 mg total) by mouth daily.   clobetasol  cream (TEMOVATE ) 0.05 % APPLY 1 APPLICATION TOPICALLY 2 (TWO) TIMES DAILY. PEA SIZE AMOUNT TO ALL AREAS TWICE DAY.   hydrOXYzine  (VISTARIL ) 25 MG capsule Take 1-2 capsules as needed, up to 3 times/day for itching. Avoid machinery with  use due to drowsiness.   meclizine  (ANTIVERT ) 25 MG tablet Take 1 tablet (25 mg total) by mouth 3 (three) times daily as needed.   methocarbamol  (ROBAXIN -750) 750 MG tablet Take 1 tablet (750 mg total) by mouth 3 (three) times daily.   Multiple Vitamins-Minerals (BARIATRIC MULTIVITAMINS/IRON) CAPS Take by mouth.   ondansetron  (ZOFRAN -ODT) 4 MG disintegrating tablet Take 1 tablet (4 mg total) by mouth every 8 (eight) hours as needed (dizziness).   pantoprazole  (PROTONIX ) 40 MG tablet Take 1 tablet (40 mg total) by mouth daily.   tirzepatide  (MOUNJARO ) 2.5 MG/0.5ML Pen Inject 2.5 mg into the skin once a week. Patient order online   valACYclovir  (VALTREX ) 500 MG tablet TAKE 1 TABLET (500 MG TOTAL) BY MOUTH DAILY. CAN INCREASE TO TWICE A DAY FOR 5 DAYS IN THE EVENT OF A RECURRENCE   buPROPion  (WELLBUTRIN  XL) 300 MG 24 hr tablet Take 1 tablet (300 mg total) by mouth daily. (Patient not taking: Reported on 09/20/2024)   cyclobenzaprine  (FLEXERIL ) 10 MG tablet Take 1 tablet (10 mg total) by mouth at bedtime. (Patient not taking: Reported on 09/20/2024)   diazepam  (VALIUM ) 2 MG tablet Take 1-2 tablets as needed PO, for headache relief. Repeat 6-8 hours, max of 10 mg a day. (Patient not taking: Reported on 09/20/2024)   hydrochlorothiazide  (HYDRODIURIL ) 25 MG tablet Take 1 tablet (25 mg total) by mouth daily. (Patient not taking: Reported on 09/20/2024)   No facility-administered encounter medications on file as of 09/20/2024.    Past Medical History:  Diagnosis Date   Anxiety    Arthritis    Asthma 2006   h/o    Asthma 09/26/2015   Benign essential HTN 10/17/2015   Depression    Diabetic acidosis, type II (HCC)    GERD (gastroesophageal reflux disease)    Headache    History of chicken pox    History of gestational diabetes 01/13/2009   Insulin  requiring during pregnancies   History of kidney stones    h/o   History of postpartum depression 05/20/2017   Hypertension    PCOS (polycystic ovarian  syndrome)    Polycystic ovaries 10/11/1998   Sleep apnea    uses cpap   Vitamin D  deficiency disease     Past Surgical History:  Procedure Laterality Date   ENDOMETRIAL ABLATION Bilateral 03/07/2020   Procedure: ENDOMETRIAL ABLATION;  Surgeon: Connell Davies, MD;  Location: [facility] OR[facility];  Service: Gynecology;  Laterality: Bilateral;   FRACTURE SURGERY  1995   Exploratory surgery on ankle   HYSTEROSCOPY WITH D & C Bilateral 03/07/2020   Procedure: DILATATION AND CURETTAGE /HYSTEROSCOPY;  Surgeon: Connell Davies, MD;  Location: [facility] OR[facility];  Service: Gynecology;  Laterality: Bilateral;   LAPAROSCOPIC GASTRIC SLEEVE RESECTION  08/22/2020  Camellia Blush, [facility]   left ankle repair  10/11/1993   UPPER GI ENDOSCOPY N/A 09/01/2020   Procedure: UPPER GI ENDOSCOPY;  Surgeon: Blush Camellia, MD;  Location: [facility];  Service: General;  Laterality: N/A;    Family History  Problem Relation Age of Onset   Diabetes Father    Hypertension Father    Obesity Father    Hepatitis C Mother    Anxiety disorder Mother    Arthritis Mother    Diabetes Mother    Drug abuse Mother    Hypertension Mother    Miscarriages / Stillbirths Mother    Heart attack Maternal Grandfather    Hypertension Paternal Grandmother    Diabetes Paternal Grandmother    ADD / ADHD Son    Anxiety disorder Son    Learning disabilities Son    ADD / ADHD Son    Anxiety disorder Son    Asthma Son     Social History   Socioeconomic History   Marital status: Married    Spouse name: Not on file   Number of children: 3   Years of education: Not on file   Highest education level: Some college, no degree  Occupational History   Not on file  Tobacco Use   Smoking status: Former    Current packs/day: 0.00    Average packs/day: 0.5 packs/day for 10.0 years (5.0 ttl pk-yrs)    Types: Cigarettes    Start date: 10/11/1994    Quit date: 03/17/2005    Years since quitting: 19.5   Smokeless tobacco: Never   Tobacco  comments:    Smoke free for 18 years  Vaping Use   Vaping status: Never Used  Substance and Sexual Activity   Alcohol use: Not Currently   Drug use: No   Sexual activity: Yes    Birth control/protection: Other-see comments    Comment: Husband had vasectomy  Other Topics Concern   Not on file  Social History Narrative   Not on file   Social Drivers of Health   Tobacco Use: Medium Risk (09/20/2024)   Patient History    Smoking Tobacco Use: Former    Smokeless Tobacco Use: Never    Passive Exposure: Not on Actuary Strain: Low Risk (07/07/2023)   Overall Financial Resource Strain (CARDIA)    Difficulty of Paying Living Expenses: Not very hard  Food Insecurity: No Food Insecurity (07/07/2023)   Hunger Vital Sign    Worried About Running Out of Food in the Last Year: Never true    Ran Out of Food in the Last Year: Never true  Transportation Needs: No Transportation Needs (07/07/2023)   PRAPARE - Administrator, Civil Service (Medical): No    Lack of Transportation (Non-Medical): No  Physical Activity: Insufficiently Active (07/07/2023)   Exercise Vital Sign    Days of Exercise per Week: 3 days    Minutes of Exercise per Session: 40 min  Stress: No Stress Concern Present (07/07/2023)   [employer] of Occupational Health - Occupational Stress Questionnaire    Feeling of Stress : Only a little  Social Connections: Moderately Integrated (07/07/2023)   Social Connection and Isolation Panel    Frequency of Communication with Friends and Family: Three times a week    Frequency of Social Gatherings with Friends and Family: Once a week    Attends Religious Services: 1 to 4 times per year    Active Member of Clubs or Organizations: No  Attends Banker Meetings: Not on file    Marital Status: Married  Intimate Partner Violence: Not on file  Depression [phone]): Low Risk (09/20/2024)   Depression (PHQ2-9)    PHQ-2 Score: 0  Alcohol Screen:  Low Risk (09/20/2024)   Alcohol Screen    Last Alcohol Screening Score (AUDIT): 0  Housing: Low Risk (07/07/2023)   Housing    Last Housing Risk Score: 0  Utilities: Not on file  Health Literacy: Not on file    Review of Systems  Gastrointestinal:  Negative for abdominal pain, heartburn, nausea and vomiting.  Psychiatric/Behavioral:  Positive for depression.         Objective    BP 120/70 (Cuff Size: Large)   Pulse 73   Temp 98.1 F (36.7 C) (Oral)   Resp 16   Ht 5' 1 (1.549 m)   Wt 207 lb 9.6 oz (94.2 kg)   SpO2 99%   BMI 39.23 kg/m   Physical Exam Constitutional:      Appearance: Normal appearance.  HENT:     Head: Normocephalic and atraumatic.  Eyes:     Conjunctiva/sclera: Conjunctivae normal.  Cardiovascular:     Rate and Rhythm: Normal rate and regular rhythm.     Pulses:          Dorsalis pedis pulses are 2+ on the right side and 2+ on the left side.  Pulmonary:     Effort: Pulmonary effort is normal.     Breath sounds: Normal breath sounds.  Musculoskeletal:     Right foot: Normal range of motion. No deformity, bunion, Charcot foot, foot drop or prominent metatarsal heads.     Left foot: Normal range of motion. No deformity, bunion, Charcot foot, foot drop or prominent metatarsal heads.  Feet:     Right foot:     Protective Sensation: 6 sites tested.  6 sites sensed.     Skin integrity: Skin integrity normal.     Toenail Condition: Right toenails are normal.     Left foot:     Protective Sensation: 6 sites tested.  6 sites sensed.     Skin integrity: Skin integrity normal.     Toenail Condition: Left toenails are normal.  Skin:    General: Skin is warm and dry.     Comments: Eczema on right ear  Neurological:     General: No focal deficit present.     Mental Status: She is alert. Mental status is at baseline.  Psychiatric:        Mood and Affect: Mood normal.        Behavior: Behavior normal.         Assessment & Plan:   Assessment &  Plan Hx of Hypertension Blood pressure controlled at 120/70 mmHg off medication. Labs needed to assess kidney function before considering HCTZ reinitiation. - Ordered labs to assess kidney function. - Instructed to monitor blood pressure at home every other day or a couple of times a week. - Educated on proper technique for home blood pressure monitoring.  Type 2 diabetes mellitus Currently in prediabetic range. Weight loss achieved with GLP-1 agonist. A1c likely improved. Lowest dose of tirzepatide  not FDA approved for maintenance, may need dose increase. - Ordered labs to check A1c and other relevant parameters. - Continue current GLP-1 agonist regimen. - Encouraged follow-up with eye doctor for annual diabetic eye exam. - Performed diabetic foot exam.  Major depressive disorder, recurrent Increased fatigue and mood changes after stopping Wellbutrin .  Citalopram  dose increased to 40 mg. - Refilled citalopram  40 mg. - Discussed option to restart Wellbutrin  if desired.  Chronic tension-type headache Managed with methocarbamol  as needed. Flexeril  caused excessive sedation. - Prescribed methocarbamol  as needed for headache management.  Gastroesophageal reflux disease Symptoms managed with over-the-counter omeprazole . - Prescribed pantoprazole .  Eczema of external ear and nose Chronic dry skin, possibly eczema. No signs of fungal infection or skin cancer. - Recommended heavy moisturizer such as Vaseline for affected areas. - Will consider eczema cream if moisturizer is insufficient.  Personal history of bariatric surgery Weight management ongoing with GLP-1 agonist. Hair loss possibly related to weight loss and nutritional changes. - Continue current weight management strategy with GLP-1 agonist.  General Health Maintenance Due for tetanus vaccine, mammogram, and Pap smear. - Administered tetanus vaccine. - Ordered mammogram. - Plan for Pap smear at next visit.  - CBC  w/Diff/Platelet - Comprehensive Metabolic Panel (CMET) - HgB A1c - Urine Microalbumin w/creat. ratio - HM Diabetes Foot Exam - pantoprazole  (PROTONIX ) 40 MG tablet; Take 1 tablet (40 mg total) by mouth daily.  Dispense: 90 tablet; Refill: 1 - citalopram  (CELEXA ) 40 MG tablet; Take 1 tablet (40 mg total) by mouth daily.  Dispense: 90 tablet; Refill: 1 - methocarbamol  (ROBAXIN -750) 750 MG tablet; Take 1 tablet (750 mg total) by mouth every 8 (eight) hours as needed for muscle spasms.  Dispense: 90 tablet; Refill: 1 - Lipid Profile - MM 3D SCREENING MAMMOGRAM BILATERAL BREAST; Future - Tdap vaccine greater than or equal to 7yo IM   Return in about 6 months (around 03/21/2025) for physical w/Pap.   Sharyle Fischer, DO

## 2024-09-21 LAB — CBC WITH DIFFERENTIAL/PLATELET
Absolute Lymphocytes: 3618 {cells}/uL (ref 850–3900)
Absolute Monocytes: 558 {cells}/uL (ref 200–950)
Basophils Absolute: 54 {cells}/uL (ref 0–200)
Basophils Relative: 0.6 %
Eosinophils Absolute: 126 {cells}/uL (ref 15–500)
Eosinophils Relative: 1.4 %
HCT: 44.5 % (ref 35.9–46.0)
Hemoglobin: 14.8 g/dL (ref 11.7–15.5)
MCH: 30.3 pg (ref 27.0–33.0)
MCHC: 33.3 g/dL (ref 31.6–35.4)
MCV: 91 fL (ref 81.4–101.7)
MPV: 9.6 fL (ref 7.5–12.5)
Monocytes Relative: 6.2 %
Neutro Abs: 4644 {cells}/uL (ref 1500–7800)
Neutrophils Relative %: 51.6 %
Platelets: 290 Thousand/uL (ref 140–400)
RBC: 4.89 Million/uL (ref 3.80–5.10)
RDW: 12.7 % (ref 11.0–15.0)
Total Lymphocyte: 40.2 %
WBC: 9 Thousand/uL (ref 3.8–10.8)

## 2024-09-21 LAB — COMPREHENSIVE METABOLIC PANEL WITH GFR
AG Ratio: 1.5 (calc) (ref 1.0–2.5)
ALT: 15 U/L (ref 6–29)
AST: 16 U/L (ref 10–35)
Albumin: 3.9 g/dL (ref 3.6–5.1)
Alkaline phosphatase (APISO): 56 U/L (ref 31–125)
BUN: 16 mg/dL (ref 7–25)
CO2: 30 mmol/L (ref 20–32)
Calcium: 9.2 mg/dL (ref 8.6–10.2)
Chloride: 102 mmol/L (ref 98–110)
Creat: 0.72 mg/dL (ref 0.50–0.99)
Globulin: 2.6 g/dL (ref 1.9–3.7)
Glucose, Bld: 74 mg/dL (ref 65–99)
Potassium: 4.6 mmol/L (ref 3.5–5.3)
Sodium: 139 mmol/L (ref 135–146)
Total Bilirubin: 0.3 mg/dL (ref 0.2–1.2)
Total Protein: 6.5 g/dL (ref 6.1–8.1)
eGFR: 103 mL/min/1.73m2 (ref >=60)

## 2024-09-21 LAB — LIPID PANEL
Cholesterol: 160 mg/dL (ref <200)
HDL: 42 mg/dL — ABNORMAL LOW (ref >=50)
LDL Cholesterol (Calc): 81 mg/dL
Non-HDL Cholesterol (Calc): 118 mg/dL (ref <130)
Total CHOL/HDL Ratio: 3.8 (calc) (ref <5.0)
Triglycerides: 278 mg/dL — ABNORMAL HIGH (ref <150)

## 2024-09-21 LAB — HEMOGLOBIN A1C
Hgb A1c MFr Bld: 5.1 % (ref <5.7)
Mean Plasma Glucose: 100 mg/dL
eAG (mmol/L): 5.5 mmol/L

## 2024-09-21 LAB — MICROALBUMIN / CREATININE URINE RATIO
Creatinine, Urine: 44 mg/dL (ref 20–275)
Microalb Creat Ratio: 5 mg/g{creat} (ref <30)
Microalb, Ur: 0.2 mg/dL

## 2024-09-24 ENCOUNTER — Ambulatory Visit: Payer: Self-pay | Admitting: Internal Medicine

## 2024-10-16 ENCOUNTER — Encounter: Payer: Self-pay | Admitting: Internal Medicine

## 2025-03-27 ENCOUNTER — Encounter: Payer: Self-pay | Admitting: Internal Medicine
